# Patient Record
Sex: Male | Born: 1945
Health system: Southern US, Community
[De-identification: ages and names within clinical notes are randomized; demographics above are authoritative.]

## PROBLEM LIST (undated history)

## (undated) ENCOUNTER — Emergency Department (HOSPITAL_COMMUNITY): Discharge status: Against Medical Advice | Payer: 59

## (undated) DIAGNOSIS — M1990 Unspecified osteoarthritis, unspecified site: Secondary | ICD-10-CM

## (undated) DIAGNOSIS — T8859XA Other complications of anesthesia, initial encounter: Secondary | ICD-10-CM

## (undated) DIAGNOSIS — I10 Essential (primary) hypertension: Secondary | ICD-10-CM

## (undated) DIAGNOSIS — N320 Bladder-neck obstruction: Secondary | ICD-10-CM

## (undated) DIAGNOSIS — Z87442 Personal history of urinary calculi: Secondary | ICD-10-CM

## (undated) DIAGNOSIS — Z8719 Personal history of other diseases of the digestive system: Secondary | ICD-10-CM

## (undated) DIAGNOSIS — K219 Gastro-esophageal reflux disease without esophagitis: Secondary | ICD-10-CM

## (undated) DIAGNOSIS — T884XXA Failed or difficult intubation, initial encounter: Secondary | ICD-10-CM

## (undated) DIAGNOSIS — R32 Unspecified urinary incontinence: Secondary | ICD-10-CM

## (undated) DIAGNOSIS — Z973 Presence of spectacles and contact lenses: Secondary | ICD-10-CM

## (undated) DIAGNOSIS — Z978 Presence of other specified devices: Secondary | ICD-10-CM

## (undated) DIAGNOSIS — Z8711 Personal history of peptic ulcer disease: Secondary | ICD-10-CM

## (undated) DIAGNOSIS — E119 Type 2 diabetes mellitus without complications: Secondary | ICD-10-CM

## (undated) DIAGNOSIS — Z8546 Personal history of malignant neoplasm of prostate: Secondary | ICD-10-CM

## (undated) HISTORY — PX: UMBILICAL HERNIA REPAIR: SHX196

## (undated) HISTORY — PX: OTHER SURGICAL HISTORY: SHX169

## (undated) HISTORY — PX: TONSILLECTOMY: SUR1361

---

## 2002-12-21 HISTORY — PX: PROSTATECTOMY: SHX69

## 2010-03-03 ENCOUNTER — Ambulatory Visit (HOSPITAL_COMMUNITY): Admission: RE | Admit: 2010-03-03 | Discharge: 2010-03-03 | Payer: Self-pay | Admitting: Urology

## 2010-03-03 HISTORY — PX: OTHER SURGICAL HISTORY: SHX169

## 2010-04-28 HISTORY — PX: OTHER SURGICAL HISTORY: SHX169

## 2010-05-08 ENCOUNTER — Ambulatory Visit (HOSPITAL_BASED_OUTPATIENT_CLINIC_OR_DEPARTMENT_OTHER): Admission: RE | Admit: 2010-05-08 | Discharge: 2010-05-08 | Payer: Self-pay | Admitting: Urology

## 2010-08-08 ENCOUNTER — Ambulatory Visit (HOSPITAL_BASED_OUTPATIENT_CLINIC_OR_DEPARTMENT_OTHER): Admission: RE | Admit: 2010-08-08 | Discharge: 2010-08-08 | Payer: Self-pay | Admitting: Urology

## 2010-09-01 ENCOUNTER — Ambulatory Visit (HOSPITAL_BASED_OUTPATIENT_CLINIC_OR_DEPARTMENT_OTHER): Admission: RE | Admit: 2010-09-01 | Discharge: 2010-09-01 | Payer: Self-pay | Admitting: Urology

## 2010-09-01 HISTORY — PX: OTHER SURGICAL HISTORY: SHX169

## 2010-11-17 ENCOUNTER — Ambulatory Visit (HOSPITAL_COMMUNITY)
Admission: AD | Admit: 2010-11-17 | Discharge: 2010-11-17 | Payer: Self-pay | Source: Home / Self Care | Admitting: Urology

## 2011-03-03 LAB — BASIC METABOLIC PANEL
BUN: 10 mg/dL (ref 6–23)
CO2: 27 mEq/L (ref 19–32)
Chloride: 103 mEq/L (ref 96–112)
Creatinine, Ser: 0.84 mg/dL (ref 0.4–1.5)
Potassium: 4.5 mEq/L (ref 3.5–5.1)
Sodium: 140 mEq/L (ref 135–145)

## 2011-03-03 LAB — CBC: Hemoglobin: 13.8 g/dL (ref 13.0–17.0)

## 2011-03-03 LAB — SURGICAL PCR SCREEN: Staphylococcus aureus: POSITIVE — AB

## 2011-03-05 LAB — POCT I-STAT 4, (NA,K, GLUC, HGB,HCT)
Glucose, Bld: 131 mg/dL — ABNORMAL HIGH (ref 70–99)
HCT: 44 % (ref 39.0–52.0)
Sodium: 140 mEq/L (ref 135–145)

## 2011-03-06 LAB — POCT I-STAT 4, (NA,K, GLUC, HGB,HCT)
Glucose, Bld: 131 mg/dL — ABNORMAL HIGH (ref 70–99)
HCT: 44 % (ref 39.0–52.0)
Hemoglobin: 15 g/dL (ref 13.0–17.0)

## 2011-03-09 LAB — GLUCOSE, CAPILLARY: Glucose-Capillary: 114 mg/dL — ABNORMAL HIGH (ref 70–99)

## 2011-03-09 LAB — POCT I-STAT 4, (NA,K, GLUC, HGB,HCT): Potassium: 3.7 mEq/L (ref 3.5–5.1)

## 2011-03-16 LAB — URINALYSIS, ROUTINE W REFLEX MICROSCOPIC
Bilirubin Urine: NEGATIVE
Specific Gravity, Urine: 1.019 (ref 1.005–1.030)
Urobilinogen, UA: 0.2 mg/dL (ref 0.0–1.0)

## 2011-03-16 LAB — URINE MICROSCOPIC-ADD ON

## 2011-03-16 LAB — URINE CULTURE: Colony Count: 30000

## 2011-03-16 LAB — CBC
Hemoglobin: 12.8 g/dL — ABNORMAL LOW (ref 13.0–17.0)
MCHC: 32.5 g/dL (ref 30.0–36.0)
MCV: 88.7 fL (ref 78.0–100.0)
Platelets: 292 10*3/uL (ref 150–400)
RBC: 4.43 MIL/uL (ref 4.22–5.81)
RDW: 14.4 % (ref 11.5–15.5)
WBC: 15.5 10*3/uL — ABNORMAL HIGH (ref 4.0–10.5)

## 2011-03-16 LAB — BASIC METABOLIC PANEL
Calcium: 8.9 mg/dL (ref 8.4–10.5)
Potassium: 4.1 mEq/L (ref 3.5–5.1)

## 2011-07-23 ENCOUNTER — Ambulatory Visit (HOSPITAL_BASED_OUTPATIENT_CLINIC_OR_DEPARTMENT_OTHER)
Admission: RE | Admit: 2011-07-23 | Discharge: 2011-07-23 | Disposition: A | Discharge status: Home / Self Care | Payer: Medicare Other | Source: Ambulatory Visit | Attending: Urology | Admitting: Urology

## 2011-07-23 DIAGNOSIS — I10 Essential (primary) hypertension: Secondary | ICD-10-CM | POA: Insufficient documentation

## 2011-07-23 DIAGNOSIS — Z01812 Encounter for preprocedural laboratory examination: Secondary | ICD-10-CM | POA: Insufficient documentation

## 2011-07-23 DIAGNOSIS — N3946 Mixed incontinence: Secondary | ICD-10-CM | POA: Insufficient documentation

## 2011-07-23 DIAGNOSIS — N320 Bladder-neck obstruction: Secondary | ICD-10-CM | POA: Insufficient documentation

## 2011-07-23 LAB — POCT I-STAT 4, (NA,K, GLUC, HGB,HCT)
Potassium: 4.6 mEq/L (ref 3.5–5.1)
Sodium: 138 mEq/L (ref 135–145)

## 2011-07-28 NOTE — Op Note (Signed)
  NAME:  Marc Burns, Marc Burns              ACCOUNT NO.:  [id number]  MEDICAL RECORD NO.:  [id number]  LOCATION:                                 FACILITY:  PHYSICIAN:  Martina Sinner, MD DATE OF BIRTH:  November 26, 1946  DATE OF PROCEDURE:  07/23/2011 DATE OF DISCHARGE:                              OPERATIVE REPORT   PREOPERATIVE DIAGNOSIS:  Bladder neck contracture.  POSTOPERATIVE DIAGNOSIS:  Bladder neck contracture.  SURGERY:  Cystoscopy with balloon dilation of bladder neck contracture plus insertion of Foley catheter.  INDICATIONS:  Marc Burns was prepped and draped in the usual fashion for the above diagnosis.  He was given preoperative antibiotics.  A 17- [place] scope was utilized.  As of before, his penile and bulbar urethra normal.  Because the rigidity of the bladder neck especially distal to the bladder neck, I could not see the actual opening.  Under fluoroscopic and cystoscopic guidance, I easily passed a sensor wire curling up into the bladder.  I passed the balloon dilation catheter using the markers into the bladder.  I balloon dilated at 18 atmospheres of pressure for 5 minutes, deflated the balloon, and removed the balloon.  I used the 17-French scope to cystoscope along the sensor wire easily into the bladder.  He had grade 1 out of 4 bladder trabeculation.  There was no stitch, foreign body, or carcinoma.  He had no evidence of cystitis.  His bladder neck was nicely dilated.  I switched to a 22-French scope and with a little bit of difficulty, was able to negotiate through the bladder neck dilating a little bit further.  There was no bleeding.  There was no injury.  18-French catheter easily was inserted.  The patient was taken to recovery room.          ______________________________ Martina Sinner, MD     SAM/MEDQ  D:  07/23/2011  T:  07/23/2011  Job:  [id number]  Electronically Signed by Alfredo Martinez MD on 07/28/2011 08:00:17 AM

## 2012-02-15 DIAGNOSIS — N320 Bladder-neck obstruction: Secondary | ICD-10-CM | POA: Diagnosis not present

## 2012-02-15 DIAGNOSIS — N3941 Urge incontinence: Secondary | ICD-10-CM | POA: Diagnosis not present

## 2012-07-13 DIAGNOSIS — E7800 Pure hypercholesterolemia, unspecified: Secondary | ICD-10-CM | POA: Diagnosis not present

## 2012-07-13 DIAGNOSIS — I10 Essential (primary) hypertension: Secondary | ICD-10-CM | POA: Diagnosis not present

## 2012-07-13 DIAGNOSIS — N400 Enlarged prostate without lower urinary tract symptoms: Secondary | ICD-10-CM | POA: Diagnosis not present

## 2012-10-20 DIAGNOSIS — I10 Essential (primary) hypertension: Secondary | ICD-10-CM | POA: Diagnosis not present

## 2012-10-20 DIAGNOSIS — K219 Gastro-esophageal reflux disease without esophagitis: Secondary | ICD-10-CM | POA: Diagnosis not present

## 2012-10-20 DIAGNOSIS — E7800 Pure hypercholesterolemia, unspecified: Secondary | ICD-10-CM | POA: Diagnosis not present

## 2012-11-14 DIAGNOSIS — N3941 Urge incontinence: Secondary | ICD-10-CM | POA: Diagnosis not present

## 2012-11-14 DIAGNOSIS — N3020 Other chronic cystitis without hematuria: Secondary | ICD-10-CM | POA: Diagnosis not present

## 2012-11-14 DIAGNOSIS — N320 Bladder-neck obstruction: Secondary | ICD-10-CM | POA: Diagnosis not present

## 2013-07-26 DIAGNOSIS — N200 Calculus of kidney: Secondary | ICD-10-CM | POA: Diagnosis not present

## 2013-07-26 DIAGNOSIS — N35919 Unspecified urethral stricture, male, unspecified site: Secondary | ICD-10-CM | POA: Diagnosis present

## 2013-07-26 DIAGNOSIS — Z9889 Other specified postprocedural states: Secondary | ICD-10-CM | POA: Diagnosis not present

## 2013-07-26 DIAGNOSIS — R6510 Systemic inflammatory response syndrome (SIRS) of non-infectious origin without acute organ dysfunction: Secondary | ICD-10-CM | POA: Diagnosis not present

## 2013-07-26 DIAGNOSIS — E669 Obesity, unspecified: Secondary | ICD-10-CM | POA: Diagnosis present

## 2013-07-26 DIAGNOSIS — K5730 Diverticulosis of large intestine without perforation or abscess without bleeding: Secondary | ICD-10-CM | POA: Diagnosis not present

## 2013-07-26 DIAGNOSIS — Z8673 Personal history of transient ischemic attack (TIA), and cerebral infarction without residual deficits: Secondary | ICD-10-CM | POA: Diagnosis not present

## 2013-07-26 DIAGNOSIS — N320 Bladder-neck obstruction: Secondary | ICD-10-CM | POA: Diagnosis not present

## 2013-07-26 DIAGNOSIS — A419 Sepsis, unspecified organism: Secondary | ICD-10-CM | POA: Diagnosis present

## 2013-07-26 DIAGNOSIS — G809 Cerebral palsy, unspecified: Secondary | ICD-10-CM | POA: Diagnosis present

## 2013-07-26 DIAGNOSIS — Z6841 Body Mass Index (BMI) 40.0 and over, adult: Secondary | ICD-10-CM | POA: Diagnosis not present

## 2013-07-26 DIAGNOSIS — N1330 Unspecified hydronephrosis: Secondary | ICD-10-CM | POA: Diagnosis not present

## 2013-07-26 DIAGNOSIS — T83091A Other mechanical complication of indwelling urethral catheter, initial encounter: Secondary | ICD-10-CM | POA: Diagnosis not present

## 2013-07-26 DIAGNOSIS — Z882 Allergy status to sulfonamides status: Secondary | ICD-10-CM | POA: Diagnosis not present

## 2013-07-26 DIAGNOSIS — T83511A Infection and inflammatory reaction due to indwelling urethral catheter, initial encounter: Secondary | ICD-10-CM | POA: Diagnosis not present

## 2013-07-26 DIAGNOSIS — E785 Hyperlipidemia, unspecified: Secondary | ICD-10-CM | POA: Diagnosis present

## 2013-07-26 DIAGNOSIS — K219 Gastro-esophageal reflux disease without esophagitis: Secondary | ICD-10-CM | POA: Diagnosis present

## 2013-07-26 DIAGNOSIS — Z8546 Personal history of malignant neoplasm of prostate: Secondary | ICD-10-CM | POA: Diagnosis not present

## 2013-07-26 DIAGNOSIS — Z79899 Other long term (current) drug therapy: Secondary | ICD-10-CM | POA: Diagnosis not present

## 2013-07-26 DIAGNOSIS — N390 Urinary tract infection, site not specified: Secondary | ICD-10-CM | POA: Diagnosis not present

## 2013-07-26 DIAGNOSIS — I10 Essential (primary) hypertension: Secondary | ICD-10-CM | POA: Diagnosis present

## 2013-07-26 DIAGNOSIS — Z993 Dependence on wheelchair: Secondary | ICD-10-CM | POA: Diagnosis not present

## 2013-08-11 DIAGNOSIS — N3942 Incontinence without sensory awareness: Secondary | ICD-10-CM | POA: Diagnosis not present

## 2013-08-11 DIAGNOSIS — N3020 Other chronic cystitis without hematuria: Secondary | ICD-10-CM | POA: Diagnosis not present

## 2013-08-28 DIAGNOSIS — N12 Tubulo-interstitial nephritis, not specified as acute or chronic: Secondary | ICD-10-CM | POA: Diagnosis not present

## 2013-09-04 ENCOUNTER — Other Ambulatory Visit: Payer: Self-pay | Admitting: Urology

## 2013-09-05 ENCOUNTER — Encounter (HOSPITAL_BASED_OUTPATIENT_CLINIC_OR_DEPARTMENT_OTHER): Payer: Self-pay | Admitting: *Deleted

## 2013-09-05 NOTE — Progress Notes (Signed)
NPO AFTER MN. ARRIVE AT 0830. NEEDS ISTAT AND EKG. WILL TAKE METOPROLOL AND PRILOSEC AM OF SURG W/ SIP OF WATER.

## 2013-09-05 NOTE — Progress Notes (Addendum)
09/05/13 1527  OBSTRUCTIVE SLEEP APNEA  Have you ever been diagnosed with sleep apnea through a sleep study? No  Do you snore loudly (loud enough to be heard through closed doors)?  0  Do you often feel tired, fatigued, or sleepy during the daytime? 0  Has anyone observed you stop breathing during your sleep? 0  Do you have, or are you being treated for high blood pressure? 1  BMI more than 35 kg/m2? 1  Age over 69 years old? 1  Neck circumference greater than 40 cm/18 inches? 1  Gender: 1  Obstructive Sleep Apnea Score 5  Score 4 or greater  Results sent to PCP  LETTER FAXED TO PT PCP, DR Selinda Flavin , [facility] IN [place]

## 2013-09-11 NOTE — H&P (Signed)
History of Present Illness   Marc Burns has a complicated bladder neck issue. He has not wanted diversion in the past. In August 2012, I did a balloon dilation of his bladder neck contracture.  He normally manages himself with an 50 French catheter he changes every 2-3 weeks to try to reduce infections. He often uses a Coude catheter if there is a little bit of scar tissue. Recently he went to the hospital in [place] and was admitted from Wednesday to Saturday with a urinary tract infection. He was told he had a right renal stone but it was not obstructing. Things have settled down. He was quite ill with a lot of discomfort at the time.   He now has a 70 [place] Coude in, since he could not get the 18 straight in spite of using the Coude prior. He still capped it off. Other times he uses a leg bag when he is out in public. He finds that the 16 does not drain as well.  He wondered if a suprapubic tube would help.  Review of Systems: No other change in bowel or neurologic systems.   There is no other modifying factors or associated signs or symptoms. There is no other aggravating or relieving factors. The symptoms were moderate in severity and came on fairly acutely.    Past Medical History Problems  1. History of  Heartburn 787.1 2. History of  Nephrolithiasis V13.01 3. History of  Prostate Cancer V10.46  Surgical History Problems  1. History of  Cystoscopy For Urethral Stricture 2. History of  Cystoscopy For Urethral Stricture 3. History of  Cystoscopy For Urethral Stricture 4. History of  Cystoscopy With Biopsy 5. History of  Cystoscopy With Fulguration 6. History of  Prostate Surgery 7. Prostate Surgery 8. History of  Transurethral Resection Of Bladder Neck 9. History of  Umbilical Hernia Repair  Current Meds 1. Centrum Silver TABS; Therapy: (Recorded:08Mar2011) to 2. Cephalexin 500 MG Oral Capsule; Therapy: (Recorded:22Aug2014) to 3. Crestor 10 MG Oral Tablet; Therapy:  (Recorded:08Mar2011) to 4. Metoprolol Tartrate 25 MG Oral Tablet; Therapy: (Recorded:08Mar2011) to 5. PriLOSEC OTC 20 MG Oral Tablet Delayed Release; Therapy: (Recorded:08Mar2011) to  Allergies Medication  1. Sulfa Drugs  Family History Problems  1. Paternal history of  Death In The Family Father age 70; heart attack 2. Maternal history of  Death In The Family Mother age 55; heart attack 3. Family history of  Family Health Status Number Of Children 3 daughters  Social History Problems  1. Alcohol Use 2 a day 2. Caffeine Use 3 a day 3. Marital History - Currently Married 4. Never A Smoker 5. Occupation: retired Denied  6. History of  Tobacco Use  Vitals Vital Signs [Data Includes: Last 1 Day]  22Aug2014 10:45AM  BMI Calculated: 23.7 BSA Calculated: 1.88 Height: 5 ft 9 in Weight: 160 lb  Blood Pressure: 138 / 73 Temperature: 98.2 F Heart Rate: 73  Assessment Assessed  1. Chronic Cystitis 595.2 2. Urinary Incontinence Without Sensory Awareness Of Fullness 788.34  Discussion/Summary   In my opinion, Marc Burns should not try to let his bladder neck scar down, hoping it will be 100%. If it gets down to an irregular and small abnormality, he will still be totally incontinent and would be very difficult to manage even with the suprapubic. It will not change the natural history of UTI's, the risk of bladder stones or upper tract issues.   He does not want another dilation yet. If he says  he has a trouble with the 16 versus the 18, he is going to call me and we will balloon dilate him under anesthesia. He still does not want a diversion. Upper tracts have been cleared except or renal stone. See in a year.  After a thorough review of the management options for the patient's condition the patient  elected to proceed with surgical therapy as noted above. We have discussed the potential benefits and risks of the procedure, side effects of the proposed treatment, the  likelihood of the patient achieving the goals of the procedure, and any potential problems that might occur during the procedure or recuperation. Informed consent has been obtained.

## 2013-09-12 ENCOUNTER — Ambulatory Visit (HOSPITAL_COMMUNITY): Payer: Medicare Other

## 2013-09-12 ENCOUNTER — Other Ambulatory Visit: Payer: Self-pay

## 2013-09-12 ENCOUNTER — Ambulatory Visit (HOSPITAL_BASED_OUTPATIENT_CLINIC_OR_DEPARTMENT_OTHER)
Admission: RE | Admit: 2013-09-12 | Discharge: 2013-09-12 | Disposition: A | Discharge status: Home / Self Care | Payer: Medicare Other | Source: Ambulatory Visit | Attending: Urology | Admitting: Urology

## 2013-09-12 ENCOUNTER — Ambulatory Visit (HOSPITAL_BASED_OUTPATIENT_CLINIC_OR_DEPARTMENT_OTHER): Payer: Medicare Other | Admitting: Anesthesiology

## 2013-09-12 ENCOUNTER — Encounter (HOSPITAL_BASED_OUTPATIENT_CLINIC_OR_DEPARTMENT_OTHER): Payer: Self-pay | Admitting: Anesthesiology

## 2013-09-12 ENCOUNTER — Encounter (HOSPITAL_BASED_OUTPATIENT_CLINIC_OR_DEPARTMENT_OTHER): Payer: Self-pay

## 2013-09-12 ENCOUNTER — Encounter (HOSPITAL_BASED_OUTPATIENT_CLINIC_OR_DEPARTMENT_OTHER)
Admission: RE | Disposition: A | Discharge status: Home / Self Care | Payer: Self-pay | Source: Ambulatory Visit | Attending: Urology

## 2013-09-12 DIAGNOSIS — N3020 Other chronic cystitis without hematuria: Secondary | ICD-10-CM | POA: Diagnosis not present

## 2013-09-12 DIAGNOSIS — N320 Bladder-neck obstruction: Secondary | ICD-10-CM | POA: Diagnosis not present

## 2013-09-12 DIAGNOSIS — Z79899 Other long term (current) drug therapy: Secondary | ICD-10-CM | POA: Diagnosis not present

## 2013-09-12 DIAGNOSIS — N3942 Incontinence without sensory awareness: Secondary | ICD-10-CM | POA: Diagnosis not present

## 2013-09-12 DIAGNOSIS — Z8546 Personal history of malignant neoplasm of prostate: Secondary | ICD-10-CM | POA: Diagnosis not present

## 2013-09-12 DIAGNOSIS — K219 Gastro-esophageal reflux disease without esophagitis: Secondary | ICD-10-CM | POA: Diagnosis not present

## 2013-09-12 DIAGNOSIS — I10 Essential (primary) hypertension: Secondary | ICD-10-CM | POA: Diagnosis not present

## 2013-09-12 HISTORY — DX: Personal history of other diseases of the digestive system: Z87.19

## 2013-09-12 HISTORY — PX: BALLOON DILATION: SHX5330

## 2013-09-12 HISTORY — DX: Unspecified osteoarthritis, unspecified site: M19.90

## 2013-09-12 HISTORY — DX: Personal history of peptic ulcer disease: Z87.11

## 2013-09-12 HISTORY — DX: Essential (primary) hypertension: I10

## 2013-09-12 HISTORY — DX: Personal history of malignant neoplasm of prostate: Z85.46

## 2013-09-12 HISTORY — DX: Presence of spectacles and contact lenses: Z97.3

## 2013-09-12 HISTORY — DX: Gastro-esophageal reflux disease without esophagitis: K21.9

## 2013-09-12 HISTORY — DX: Personal history of urinary calculi: Z87.442

## 2013-09-12 HISTORY — DX: Bladder-neck obstruction: N32.0

## 2013-09-12 HISTORY — DX: Unspecified urinary incontinence: R32

## 2013-09-12 LAB — POCT I-STAT 4, (NA,K, GLUC, HGB,HCT): Potassium: 4.1 mEq/L (ref 3.5–5.1)

## 2013-09-12 SURGERY — BALLOON DILATION
Anesthesia: General | Site: Urethra | Wound class: Clean Contaminated

## 2013-09-12 MED ORDER — OXYCODONE HCL 5 MG/5ML PO SOLN
5.0000 mg | Freq: Once | ORAL | Status: DC | PRN
  Filled 2013-09-12: qty 5

## 2013-09-12 MED ORDER — IOHEXOL 350 MG/ML SOLN
INTRAVENOUS | Status: DC | PRN
  Administered 2013-09-12: 10 mL

## 2013-09-12 MED ORDER — PROPOFOL 10 MG/ML IV BOLUS
INTRAVENOUS | Status: DC | PRN
  Administered 2013-09-12: 250 mg via INTRAVENOUS

## 2013-09-12 MED ORDER — LIDOCAINE HCL (CARDIAC) 20 MG/ML IV SOLN
INTRAVENOUS | Status: DC | PRN
  Administered 2013-09-12: 80 mg via INTRAVENOUS

## 2013-09-12 MED ORDER — CEFAZOLIN SODIUM-DEXTROSE 2-3 GM-% IV SOLR
2.0000 g | INTRAVENOUS | Status: AC
  Administered 2013-09-12: 2 g via INTRAVENOUS
  Filled 2013-09-12: qty 50

## 2013-09-12 MED ORDER — CEFAZOLIN SODIUM 1-5 GM-% IV SOLN
1.0000 g | INTRAVENOUS | Status: DC
  Filled 2013-09-12: qty 50

## 2013-09-12 MED ORDER — FENTANYL CITRATE 0.05 MG/ML IJ SOLN
INTRAMUSCULAR | Status: DC | PRN
  Administered 2013-09-12 (×2): 50 ug via INTRAVENOUS

## 2013-09-12 MED ORDER — LACTATED RINGERS IV SOLN
INTRAVENOUS | Status: DC
  Administered 2013-09-12: 09:00:00 via INTRAVENOUS
  Filled 2013-09-12: qty 1000

## 2013-09-12 MED ORDER — OXYCODONE HCL 5 MG PO TABS
5.0000 mg | ORAL_TABLET | Freq: Once | ORAL | Status: DC | PRN
  Filled 2013-09-12: qty 1

## 2013-09-12 MED ORDER — STERILE WATER FOR IRRIGATION IR SOLN
Status: DC | PRN
  Administered 2013-09-12: 3000 mL

## 2013-09-12 MED ORDER — HYDROMORPHONE HCL PF 1 MG/ML IJ SOLN
0.2500 mg | INTRAMUSCULAR | Status: DC | PRN
  Filled 2013-09-12: qty 1

## 2013-09-12 MED ORDER — MEPERIDINE HCL 25 MG/ML IJ SOLN
6.2500 mg | INTRAMUSCULAR | Status: DC | PRN
  Filled 2013-09-12: qty 1

## 2013-09-12 MED ORDER — ONDANSETRON HCL 4 MG/2ML IJ SOLN
INTRAMUSCULAR | Status: DC | PRN
  Administered 2013-09-12: 4 mg via INTRAVENOUS

## 2013-09-12 MED ORDER — HYDROCODONE-ACETAMINOPHEN 5-325 MG PO TABS: 1.0000 | ORAL_TABLET | Freq: Four times a day (QID) | ORAL | Status: DC | PRN

## 2013-09-12 MED ORDER — CIPROFLOXACIN HCL 250 MG PO TABS: 250.0000 mg | ORAL_TABLET | Freq: Two times a day (BID) | ORAL | Status: DC

## 2013-09-12 MED ORDER — DEXAMETHASONE SODIUM PHOSPHATE 4 MG/ML IJ SOLN
INTRAMUSCULAR | Status: DC | PRN
  Administered 2013-09-12: 10 mg via INTRAVENOUS

## 2013-09-12 MED ORDER — PROMETHAZINE HCL 25 MG/ML IJ SOLN
6.2500 mg | INTRAMUSCULAR | Status: DC | PRN
  Filled 2013-09-12: qty 1

## 2013-09-12 MED ORDER — CIPROFLOXACIN IN D5W 400 MG/200ML IV SOLN
400.0000 mg | INTRAVENOUS | Status: AC
  Administered 2013-09-12: 400 mg via INTRAVENOUS
  Filled 2013-09-12: qty 200

## 2013-09-12 SURGICAL SUPPLY — 24 items
ADAPTER CATH URET PLST 4-6FR (CATHETERS) IMPLANT
BAG DRAIN URO-CYSTO SKYTR STRL (DRAIN) ×2 IMPLANT
BAG URINE LEG 19OZ MD ST LTX (BAG) ×2 IMPLANT
BALLN NEPHROSTOMY (BALLOONS) ×2
BALLOON NEPHROSTOMY (BALLOONS) ×1 IMPLANT
CANISTER SUCT LVC 12 LTR MEDI- (MISCELLANEOUS) ×2 IMPLANT
CATH FOLEY 2W COUNCIL 20FR 5CC (CATHETERS) ×2 IMPLANT
CATH ROBINSON RED A/P 14FR (CATHETERS) IMPLANT
CATH URET 5FR 28IN CONE TIP (BALLOONS)
CATH URET 5FR 28IN OPEN ENDED (CATHETERS) ×2 IMPLANT
CATH URET 5FR 70CM CONE TIP (BALLOONS) IMPLANT
CLOTH BEACON ORANGE TIMEOUT ST (SAFETY) ×2 IMPLANT
DRAPE CAMERA CLOSED 9X96 (DRAPES) ×2 IMPLANT
GLOVE BIO SURGEON STRL SZ7.5 (GLOVE) ×2 IMPLANT
GLOVE BIOGEL PI IND STRL 7.5 (GLOVE) ×2 IMPLANT
GLOVE BIOGEL PI INDICATOR 7.5 (GLOVE) ×2
GOWN PREVENTION PLUS LG XLONG (DISPOSABLE) ×2 IMPLANT
GOWN STRL REIN XL XLG (GOWN DISPOSABLE) IMPLANT
GUIDEWIRE STR DUAL SENSOR (WIRE) ×2 IMPLANT
NS IRRIG 500ML POUR BTL (IV SOLUTION) IMPLANT
PACK CYSTOSCOPY (CUSTOM PROCEDURE TRAY) ×2 IMPLANT
SYRINGE IRR TOOMEY STRL 70CC (SYRINGE) IMPLANT
WATER STERILE IRR 3000ML UROMA (IV SOLUTION) ×2 IMPLANT
WATER STERILE IRR 500ML POUR (IV SOLUTION) ×2 IMPLANT

## 2013-09-12 NOTE — Transfer of Care (Signed)
Immediate Anesthesia Transfer of Care Note  Patient: Marc Burns  Procedure(s) Performed: Procedure(s): CYSTO BALLOON DILATION/RETROGRADE URETHROGRAM (N/A)  Patient Location: PACU  Anesthesia Type:General  Level of Consciousness: awake, alert  and oriented  Airway & Oxygen Therapy: Patient Spontanous Breathing and Patient connected to nasal cannula oxygen  Post-op Assessment: Report given to PACU RN  Post vital signs: Reviewed and stable  Complications: No apparent anesthesia complications

## 2013-09-12 NOTE — Op Note (Signed)
Preoperative diagnosis: Bladder neck contracture Postoperative diagnosis: Bladder neck contracture Surgery: Cystoscopy, retrograde urethrogram, balloon dilation of bladder neck contracture Surgeon: Dr. Lorin Picket Tranika Scholler  The patient has the above diagnoses and consented the above procedure. Preoperative antibiotics were given.  Leg position was excellent  17 [place] scope was utilized. Penile and bulbar urethra were within normal limits. The proximal bulbar urethra and near the membranous urethra and bladder neck he had a marked false passage at 6:00 with a large ski jump affect into a bladder neck but estimated at approximately 16 [place]. I did not try to cystoscope through it.  I did a retrograde urethrogram with the patient in the AP position. I utilized a 6 [place] open-ended ureteral catheter cut as per protocol. 10 cc of contrast was injected under fluoroscopy and x-ray was taken. The direct along the normal-looking urethra into the bladder easily. There was no extravasation  Sensor wire was passed using a 21 [place] scope into the bladder neck and was in good position fluoroscopically. Balloon dilation catheter was placed over it into the bladder fluoroscopically. It was dilated to 18 atmospheres of pressure. The bladder neck was dilated for 5 minutes. The balloon dilation catheter was emptied and removed.  With the 21 [place] scope I could then passed along the urethra and alongside the wire into the bladder neck. Bladder neck I was a little bit tight around the scope would easily went in. The bladder mucosa and trigone were normal. There is no cystitis. Is no carcinoma.  Surprisingly a 20 [place] catheter would not go into the bladder and was being hung up more distally in the bulbar urethra. There was a little bit of a false passage in that area. An 89 [place] Coude went in to the bladder easily and it was not passed over the wire but alongside it. Wire was removed. Straight drainage to  leg bag applied. Patient was taken to recovery

## 2013-09-12 NOTE — Interval H&P Note (Signed)
History and Physical Interval Note:  09/12/2013 10:26 AM  Marc Burns  has presented today for surgery, with the diagnosis of BLADDER NECK CONTRACTURE  The various methods of treatment have been discussed with the patient and family. After consideration of risks, benefits and other options for treatment, the patient has consented to  Procedure(s): CYSTO BALLOON DILATION/RETROGRADE URETHROGRAM (N/A) as a surgical intervention .  The patient's history has been reviewed, patient examined, no change in status, stable for surgery.  I have reviewed the patient's chart and labs.  Questions were answered to the patient's satisfaction.     Katrin Grabel A

## 2013-09-12 NOTE — Anesthesia Procedure Notes (Signed)
Procedure Name: LMA Insertion Date/Time: 09/12/2013 10:02 AM Performed by: Maris Berger T Pre-anesthesia Checklist: Patient identified, Emergency Drugs available, Suction available and Patient being monitored Patient Re-evaluated:Patient Re-evaluated prior to inductionOxygen Delivery Method: Circle System Utilized Preoxygenation: Pre-oxygenation with 100% oxygen Intubation Type: IV induction Ventilation: Mask ventilation without difficulty LMA: LMA inserted LMA Size: 5.0 Number of attempts: 1 Placement Confirmation: positive ETCO2 Dental Injury: Teeth and Oropharynx as per pre-operative assessment  Comments: Gauze roll between teeth

## 2013-09-12 NOTE — Anesthesia Postprocedure Evaluation (Signed)
Anesthesia Post Note  Patient: Marc Burns  Procedure(s) Performed: Procedure(s) (LRB): CYSTO BALLOON DILATION/RETROGRADE URETHROGRAM (N/A)  Anesthesia type: General  Patient location: PACU  Post pain: Pain level controlled  Post assessment: Post-op Vital signs reviewed  Last Vitals: BP 135/73  Pulse 72  Temp(Src) 36.6 C (Oral)  Resp 16  Ht 5\' 9"  (1.753 m)  Wt 263 lb (119.296 kg)  BMI 38.82 kg/m2  SpO2 99%  Post vital signs: Reviewed  Level of consciousness: sedated  Complications: No apparent anesthesia complications

## 2013-09-12 NOTE — Anesthesia Preprocedure Evaluation (Addendum)
Anesthesia Evaluation  Patient identified by MRN, date of birth, ID band Patient awake    Reviewed: Allergy & Precautions, H&P , NPO status , Patient's Chart, lab work & pertinent test results, reviewed documented beta blocker date and time   Airway Mallampati: II TM Distance: >3 FB Neck ROM: Full    Dental  (+) Dental Advisory Given   Pulmonary neg pulmonary ROS,  breath sounds clear to auscultation        Cardiovascular hypertension, Pt. on medications and Pt. on home beta blockers negative cardio ROS  Rhythm:Regular Rate:Normal     Neuro/Psych negative neurological ROS  negative psych ROS   GI/Hepatic Neg liver ROS, GERD-  Medicated,  Endo/Other  negative endocrine ROS  Renal/GU negative Renal ROS     Musculoskeletal negative musculoskeletal ROS (+)   Abdominal (+) + obese,   Peds  Hematology negative hematology ROS (+)   Anesthesia Other Findings   Reproductive/Obstetrics                          Anesthesia Physical Anesthesia Plan  ASA: II  Anesthesia Plan: General   Post-op Pain Management:    Induction: Intravenous  Airway Management Planned: LMA  Additional Equipment:   Intra-op Plan:   Post-operative Plan: Extubation in OR  Informed Consent: I have reviewed the patients History and Physical, chart, labs and discussed the procedure including the risks, benefits and alternatives for the proposed anesthesia with the patient or authorized representative who has indicated his/her understanding and acceptance.   Dental advisory given  Plan Discussed with: CRNA  Anesthesia Plan Comments:         Anesthesia Quick Evaluation

## 2013-09-13 ENCOUNTER — Encounter (HOSPITAL_BASED_OUTPATIENT_CLINIC_OR_DEPARTMENT_OTHER): Payer: Self-pay | Admitting: Urology

## 2013-09-21 DIAGNOSIS — N320 Bladder-neck obstruction: Secondary | ICD-10-CM | POA: Diagnosis not present

## 2013-10-19 DIAGNOSIS — K219 Gastro-esophageal reflux disease without esophagitis: Secondary | ICD-10-CM | POA: Diagnosis not present

## 2013-10-19 DIAGNOSIS — E109 Type 1 diabetes mellitus without complications: Secondary | ICD-10-CM | POA: Diagnosis not present

## 2013-10-19 DIAGNOSIS — E7800 Pure hypercholesterolemia, unspecified: Secondary | ICD-10-CM | POA: Diagnosis not present

## 2013-10-19 DIAGNOSIS — E669 Obesity, unspecified: Secondary | ICD-10-CM | POA: Diagnosis not present

## 2013-10-19 DIAGNOSIS — I10 Essential (primary) hypertension: Secondary | ICD-10-CM | POA: Diagnosis not present

## 2013-10-23 DIAGNOSIS — I10 Essential (primary) hypertension: Secondary | ICD-10-CM | POA: Diagnosis not present

## 2013-10-23 DIAGNOSIS — Z23 Encounter for immunization: Secondary | ICD-10-CM | POA: Diagnosis not present

## 2013-10-23 DIAGNOSIS — Z0000 Encounter for general adult medical examination without abnormal findings: Secondary | ICD-10-CM | POA: Diagnosis not present

## 2013-10-23 DIAGNOSIS — K219 Gastro-esophageal reflux disease without esophagitis: Secondary | ICD-10-CM | POA: Diagnosis not present

## 2013-10-23 DIAGNOSIS — E7800 Pure hypercholesterolemia, unspecified: Secondary | ICD-10-CM | POA: Diagnosis not present

## 2014-02-16 DIAGNOSIS — J209 Acute bronchitis, unspecified: Secondary | ICD-10-CM | POA: Diagnosis not present

## 2014-02-26 DIAGNOSIS — M545 Low back pain, unspecified: Secondary | ICD-10-CM | POA: Diagnosis not present

## 2014-02-26 DIAGNOSIS — Z882 Allergy status to sulfonamides status: Secondary | ICD-10-CM | POA: Diagnosis not present

## 2014-02-26 DIAGNOSIS — K219 Gastro-esophageal reflux disease without esophagitis: Secondary | ICD-10-CM | POA: Diagnosis not present

## 2014-02-26 DIAGNOSIS — I10 Essential (primary) hypertension: Secondary | ICD-10-CM | POA: Diagnosis not present

## 2014-02-26 DIAGNOSIS — Z8546 Personal history of malignant neoplasm of prostate: Secondary | ICD-10-CM | POA: Diagnosis not present

## 2014-02-26 DIAGNOSIS — G809 Cerebral palsy, unspecified: Secondary | ICD-10-CM | POA: Diagnosis not present

## 2014-02-26 DIAGNOSIS — E119 Type 2 diabetes mellitus without complications: Secondary | ICD-10-CM | POA: Diagnosis not present

## 2014-02-26 DIAGNOSIS — Z79899 Other long term (current) drug therapy: Secondary | ICD-10-CM | POA: Diagnosis not present

## 2014-02-26 DIAGNOSIS — E785 Hyperlipidemia, unspecified: Secondary | ICD-10-CM | POA: Diagnosis not present

## 2014-02-26 DIAGNOSIS — Z8249 Family history of ischemic heart disease and other diseases of the circulatory system: Secondary | ICD-10-CM | POA: Diagnosis not present

## 2014-02-26 DIAGNOSIS — T83511A Infection and inflammatory reaction due to indwelling urethral catheter, initial encounter: Secondary | ICD-10-CM | POA: Diagnosis not present

## 2014-02-26 DIAGNOSIS — R748 Abnormal levels of other serum enzymes: Secondary | ICD-10-CM | POA: Diagnosis not present

## 2014-02-26 DIAGNOSIS — R0789 Other chest pain: Secondary | ICD-10-CM | POA: Diagnosis not present

## 2014-02-26 DIAGNOSIS — N390 Urinary tract infection, site not specified: Secondary | ICD-10-CM | POA: Diagnosis not present

## 2014-02-27 DIAGNOSIS — N390 Urinary tract infection, site not specified: Secondary | ICD-10-CM | POA: Diagnosis not present

## 2014-02-27 DIAGNOSIS — R079 Chest pain, unspecified: Secondary | ICD-10-CM | POA: Diagnosis not present

## 2014-02-28 DIAGNOSIS — R079 Chest pain, unspecified: Secondary | ICD-10-CM | POA: Diagnosis not present

## 2014-03-02 DIAGNOSIS — R079 Chest pain, unspecified: Secondary | ICD-10-CM | POA: Diagnosis not present

## 2014-03-07 DIAGNOSIS — E109 Type 1 diabetes mellitus without complications: Secondary | ICD-10-CM | POA: Diagnosis not present

## 2014-03-07 DIAGNOSIS — R0789 Other chest pain: Secondary | ICD-10-CM | POA: Diagnosis not present

## 2014-03-07 DIAGNOSIS — N3090 Cystitis, unspecified without hematuria: Secondary | ICD-10-CM | POA: Diagnosis not present

## 2014-03-22 DIAGNOSIS — Z6839 Body mass index (BMI) 39.0-39.9, adult: Secondary | ICD-10-CM | POA: Diagnosis not present

## 2014-03-22 DIAGNOSIS — K219 Gastro-esophageal reflux disease without esophagitis: Secondary | ICD-10-CM | POA: Diagnosis not present

## 2014-03-22 DIAGNOSIS — Z882 Allergy status to sulfonamides status: Secondary | ICD-10-CM | POA: Diagnosis not present

## 2014-03-22 DIAGNOSIS — I10 Essential (primary) hypertension: Secondary | ICD-10-CM | POA: Diagnosis not present

## 2014-03-22 DIAGNOSIS — Z79899 Other long term (current) drug therapy: Secondary | ICD-10-CM | POA: Diagnosis not present

## 2014-03-22 DIAGNOSIS — R82998 Other abnormal findings in urine: Secondary | ICD-10-CM | POA: Diagnosis not present

## 2014-03-22 DIAGNOSIS — Z87440 Personal history of urinary (tract) infections: Secondary | ICD-10-CM | POA: Diagnosis not present

## 2014-03-22 DIAGNOSIS — Z87442 Personal history of urinary calculi: Secondary | ICD-10-CM | POA: Diagnosis not present

## 2014-03-22 DIAGNOSIS — E119 Type 2 diabetes mellitus without complications: Secondary | ICD-10-CM | POA: Diagnosis not present

## 2014-03-22 DIAGNOSIS — N390 Urinary tract infection, site not specified: Secondary | ICD-10-CM | POA: Diagnosis not present

## 2014-03-22 DIAGNOSIS — Z8546 Personal history of malignant neoplasm of prostate: Secondary | ICD-10-CM | POA: Diagnosis not present

## 2014-03-22 DIAGNOSIS — Z8249 Family history of ischemic heart disease and other diseases of the circulatory system: Secondary | ICD-10-CM | POA: Diagnosis not present

## 2014-03-22 DIAGNOSIS — K8000 Calculus of gallbladder with acute cholecystitis without obstruction: Secondary | ICD-10-CM | POA: Diagnosis not present

## 2014-03-22 DIAGNOSIS — K8020 Calculus of gallbladder without cholecystitis without obstruction: Secondary | ICD-10-CM | POA: Diagnosis not present

## 2014-03-22 DIAGNOSIS — K8010 Calculus of gallbladder with chronic cholecystitis without obstruction: Secondary | ICD-10-CM | POA: Diagnosis not present

## 2014-03-22 DIAGNOSIS — Z8489 Family history of other specified conditions: Secondary | ICD-10-CM | POA: Diagnosis not present

## 2014-03-22 DIAGNOSIS — G809 Cerebral palsy, unspecified: Secondary | ICD-10-CM | POA: Diagnosis not present

## 2014-03-22 DIAGNOSIS — K7689 Other specified diseases of liver: Secondary | ICD-10-CM | POA: Diagnosis not present

## 2014-03-22 DIAGNOSIS — Z7982 Long term (current) use of aspirin: Secondary | ICD-10-CM | POA: Diagnosis not present

## 2014-03-23 DIAGNOSIS — R748 Abnormal levels of other serum enzymes: Secondary | ICD-10-CM | POA: Diagnosis not present

## 2014-03-23 DIAGNOSIS — R109 Unspecified abdominal pain: Secondary | ICD-10-CM | POA: Diagnosis not present

## 2014-03-23 DIAGNOSIS — K812 Acute cholecystitis with chronic cholecystitis: Secondary | ICD-10-CM | POA: Diagnosis not present

## 2014-03-23 DIAGNOSIS — K8020 Calculus of gallbladder without cholecystitis without obstruction: Secondary | ICD-10-CM | POA: Diagnosis not present

## 2014-03-23 DIAGNOSIS — N390 Urinary tract infection, site not specified: Secondary | ICD-10-CM | POA: Diagnosis not present

## 2014-03-23 DIAGNOSIS — R1084 Generalized abdominal pain: Secondary | ICD-10-CM | POA: Diagnosis not present

## 2014-03-24 DIAGNOSIS — R109 Unspecified abdominal pain: Secondary | ICD-10-CM | POA: Diagnosis not present

## 2014-03-24 DIAGNOSIS — K8000 Calculus of gallbladder with acute cholecystitis without obstruction: Secondary | ICD-10-CM | POA: Diagnosis not present

## 2014-03-24 DIAGNOSIS — K8010 Calculus of gallbladder with chronic cholecystitis without obstruction: Secondary | ICD-10-CM | POA: Diagnosis not present

## 2014-03-24 DIAGNOSIS — Z87440 Personal history of urinary (tract) infections: Secondary | ICD-10-CM | POA: Diagnosis not present

## 2014-03-24 DIAGNOSIS — K819 Cholecystitis, unspecified: Secondary | ICD-10-CM | POA: Diagnosis not present

## 2014-03-24 DIAGNOSIS — E119 Type 2 diabetes mellitus without complications: Secondary | ICD-10-CM | POA: Diagnosis not present

## 2014-03-24 DIAGNOSIS — R1084 Generalized abdominal pain: Secondary | ICD-10-CM | POA: Diagnosis not present

## 2014-03-24 DIAGNOSIS — K812 Acute cholecystitis with chronic cholecystitis: Secondary | ICD-10-CM | POA: Diagnosis not present

## 2014-03-24 DIAGNOSIS — K7689 Other specified diseases of liver: Secondary | ICD-10-CM | POA: Diagnosis not present

## 2014-03-24 DIAGNOSIS — Z79899 Other long term (current) drug therapy: Secondary | ICD-10-CM | POA: Diagnosis not present

## 2014-03-24 DIAGNOSIS — R82998 Other abnormal findings in urine: Secondary | ICD-10-CM | POA: Diagnosis not present

## 2014-03-24 DIAGNOSIS — I10 Essential (primary) hypertension: Secondary | ICD-10-CM | POA: Diagnosis not present

## 2014-03-24 DIAGNOSIS — K8020 Calculus of gallbladder without cholecystitis without obstruction: Secondary | ICD-10-CM | POA: Diagnosis not present

## 2014-03-25 DIAGNOSIS — R109 Unspecified abdominal pain: Secondary | ICD-10-CM | POA: Diagnosis not present

## 2014-03-26 DIAGNOSIS — R109 Unspecified abdominal pain: Secondary | ICD-10-CM | POA: Diagnosis not present

## 2014-05-03 DIAGNOSIS — N200 Calculus of kidney: Secondary | ICD-10-CM | POA: Diagnosis not present

## 2014-05-03 DIAGNOSIS — N320 Bladder-neck obstruction: Secondary | ICD-10-CM | POA: Diagnosis not present

## 2014-05-03 DIAGNOSIS — R300 Dysuria: Secondary | ICD-10-CM | POA: Diagnosis not present

## 2014-05-03 DIAGNOSIS — R339 Retention of urine, unspecified: Secondary | ICD-10-CM | POA: Diagnosis not present

## 2014-05-04 DIAGNOSIS — N200 Calculus of kidney: Secondary | ICD-10-CM | POA: Diagnosis not present

## 2014-05-04 DIAGNOSIS — N320 Bladder-neck obstruction: Secondary | ICD-10-CM | POA: Diagnosis not present

## 2014-05-08 DIAGNOSIS — N320 Bladder-neck obstruction: Secondary | ICD-10-CM | POA: Diagnosis not present

## 2014-05-08 DIAGNOSIS — N200 Calculus of kidney: Secondary | ICD-10-CM | POA: Diagnosis not present

## 2014-05-08 DIAGNOSIS — R339 Retention of urine, unspecified: Secondary | ICD-10-CM | POA: Diagnosis not present

## 2014-05-16 DIAGNOSIS — Z823 Family history of stroke: Secondary | ICD-10-CM | POA: Diagnosis not present

## 2014-05-16 DIAGNOSIS — E119 Type 2 diabetes mellitus without complications: Secondary | ICD-10-CM | POA: Diagnosis not present

## 2014-05-16 DIAGNOSIS — Z87442 Personal history of urinary calculi: Secondary | ICD-10-CM | POA: Diagnosis not present

## 2014-05-16 DIAGNOSIS — N200 Calculus of kidney: Secondary | ICD-10-CM | POA: Diagnosis not present

## 2014-05-16 DIAGNOSIS — Z79899 Other long term (current) drug therapy: Secondary | ICD-10-CM | POA: Diagnosis not present

## 2014-05-16 DIAGNOSIS — Z883 Allergy status to other anti-infective agents status: Secondary | ICD-10-CM | POA: Diagnosis not present

## 2014-05-16 DIAGNOSIS — Z87440 Personal history of urinary (tract) infections: Secondary | ICD-10-CM | POA: Diagnosis not present

## 2014-05-16 DIAGNOSIS — N320 Bladder-neck obstruction: Secondary | ICD-10-CM | POA: Diagnosis not present

## 2014-05-16 DIAGNOSIS — K219 Gastro-esophageal reflux disease without esophagitis: Secondary | ICD-10-CM | POA: Diagnosis not present

## 2014-05-16 DIAGNOSIS — R339 Retention of urine, unspecified: Secondary | ICD-10-CM | POA: Diagnosis not present

## 2014-05-16 DIAGNOSIS — E785 Hyperlipidemia, unspecified: Secondary | ICD-10-CM | POA: Diagnosis not present

## 2014-05-16 DIAGNOSIS — Z882 Allergy status to sulfonamides status: Secondary | ICD-10-CM | POA: Diagnosis not present

## 2014-05-16 DIAGNOSIS — I10 Essential (primary) hypertension: Secondary | ICD-10-CM | POA: Diagnosis not present

## 2014-08-17 DIAGNOSIS — N320 Bladder-neck obstruction: Secondary | ICD-10-CM | POA: Diagnosis not present

## 2014-08-17 DIAGNOSIS — N200 Calculus of kidney: Secondary | ICD-10-CM | POA: Diagnosis not present

## 2014-08-17 DIAGNOSIS — R339 Retention of urine, unspecified: Secondary | ICD-10-CM | POA: Diagnosis not present

## 2014-08-21 DIAGNOSIS — N200 Calculus of kidney: Secondary | ICD-10-CM | POA: Diagnosis not present

## 2014-08-31 DIAGNOSIS — R339 Retention of urine, unspecified: Secondary | ICD-10-CM | POA: Diagnosis not present

## 2014-08-31 DIAGNOSIS — N390 Urinary tract infection, site not specified: Secondary | ICD-10-CM | POA: Diagnosis not present

## 2014-08-31 DIAGNOSIS — N320 Bladder-neck obstruction: Secondary | ICD-10-CM | POA: Diagnosis not present

## 2014-09-14 DIAGNOSIS — N390 Urinary tract infection, site not specified: Secondary | ICD-10-CM | POA: Diagnosis not present

## 2014-09-14 DIAGNOSIS — N200 Calculus of kidney: Secondary | ICD-10-CM | POA: Diagnosis not present

## 2014-09-14 DIAGNOSIS — N320 Bladder-neck obstruction: Secondary | ICD-10-CM | POA: Diagnosis not present

## 2014-09-18 DIAGNOSIS — N320 Bladder-neck obstruction: Secondary | ICD-10-CM | POA: Diagnosis not present

## 2014-09-19 DIAGNOSIS — N390 Urinary tract infection, site not specified: Secondary | ICD-10-CM | POA: Diagnosis not present

## 2014-09-19 DIAGNOSIS — E785 Hyperlipidemia, unspecified: Secondary | ICD-10-CM | POA: Diagnosis not present

## 2014-09-19 DIAGNOSIS — N200 Calculus of kidney: Secondary | ICD-10-CM | POA: Diagnosis not present

## 2014-09-19 DIAGNOSIS — Z9079 Acquired absence of other genital organ(s): Secondary | ICD-10-CM | POA: Diagnosis not present

## 2014-09-19 DIAGNOSIS — Z79899 Other long term (current) drug therapy: Secondary | ICD-10-CM | POA: Diagnosis not present

## 2014-09-19 DIAGNOSIS — Z883 Allergy status to other anti-infective agents status: Secondary | ICD-10-CM | POA: Diagnosis not present

## 2014-09-19 DIAGNOSIS — E119 Type 2 diabetes mellitus without complications: Secondary | ICD-10-CM | POA: Diagnosis not present

## 2014-09-19 DIAGNOSIS — L22 Diaper dermatitis: Secondary | ICD-10-CM | POA: Diagnosis not present

## 2014-09-19 DIAGNOSIS — K219 Gastro-esophageal reflux disease without esophagitis: Secondary | ICD-10-CM | POA: Diagnosis not present

## 2014-09-19 DIAGNOSIS — R32 Unspecified urinary incontinence: Secondary | ICD-10-CM | POA: Diagnosis not present

## 2014-09-19 DIAGNOSIS — I10 Essential (primary) hypertension: Secondary | ICD-10-CM | POA: Diagnosis not present

## 2014-09-19 DIAGNOSIS — R339 Retention of urine, unspecified: Secondary | ICD-10-CM | POA: Diagnosis not present

## 2014-09-19 DIAGNOSIS — Z6841 Body Mass Index (BMI) 40.0 and over, adult: Secondary | ICD-10-CM | POA: Diagnosis not present

## 2014-09-19 DIAGNOSIS — N320 Bladder-neck obstruction: Secondary | ICD-10-CM | POA: Diagnosis not present

## 2014-09-19 DIAGNOSIS — Z823 Family history of stroke: Secondary | ICD-10-CM | POA: Diagnosis not present

## 2014-09-19 DIAGNOSIS — E669 Obesity, unspecified: Secondary | ICD-10-CM | POA: Diagnosis not present

## 2014-10-05 DIAGNOSIS — R339 Retention of urine, unspecified: Secondary | ICD-10-CM | POA: Diagnosis not present

## 2014-10-05 DIAGNOSIS — N320 Bladder-neck obstruction: Secondary | ICD-10-CM | POA: Diagnosis not present

## 2014-10-24 DIAGNOSIS — K219 Gastro-esophageal reflux disease without esophagitis: Secondary | ICD-10-CM | POA: Diagnosis not present

## 2014-10-24 DIAGNOSIS — E780 Pure hypercholesterolemia: Secondary | ICD-10-CM | POA: Diagnosis not present

## 2014-10-24 DIAGNOSIS — E1165 Type 2 diabetes mellitus with hyperglycemia: Secondary | ICD-10-CM | POA: Diagnosis not present

## 2014-10-24 DIAGNOSIS — I10 Essential (primary) hypertension: Secondary | ICD-10-CM | POA: Diagnosis not present

## 2014-10-31 DIAGNOSIS — Z0001 Encounter for general adult medical examination with abnormal findings: Secondary | ICD-10-CM | POA: Diagnosis not present

## 2014-10-31 DIAGNOSIS — K219 Gastro-esophageal reflux disease without esophagitis: Secondary | ICD-10-CM | POA: Diagnosis not present

## 2014-10-31 DIAGNOSIS — E780 Pure hypercholesterolemia: Secondary | ICD-10-CM | POA: Diagnosis not present

## 2014-10-31 DIAGNOSIS — Z23 Encounter for immunization: Secondary | ICD-10-CM | POA: Diagnosis not present

## 2014-10-31 DIAGNOSIS — Z1389 Encounter for screening for other disorder: Secondary | ICD-10-CM | POA: Diagnosis not present

## 2014-10-31 DIAGNOSIS — I10 Essential (primary) hypertension: Secondary | ICD-10-CM | POA: Diagnosis not present

## 2014-11-01 DIAGNOSIS — L929 Granulomatous disorder of the skin and subcutaneous tissue, unspecified: Secondary | ICD-10-CM | POA: Diagnosis not present

## 2014-11-01 DIAGNOSIS — N320 Bladder-neck obstruction: Secondary | ICD-10-CM | POA: Diagnosis not present

## 2014-11-01 DIAGNOSIS — Z87440 Personal history of urinary (tract) infections: Secondary | ICD-10-CM | POA: Diagnosis not present

## 2014-12-03 DIAGNOSIS — L929 Granulomatous disorder of the skin and subcutaneous tissue, unspecified: Secondary | ICD-10-CM | POA: Diagnosis not present

## 2014-12-03 DIAGNOSIS — N320 Bladder-neck obstruction: Secondary | ICD-10-CM | POA: Diagnosis not present

## 2014-12-03 DIAGNOSIS — Z87440 Personal history of urinary (tract) infections: Secondary | ICD-10-CM | POA: Diagnosis not present

## 2015-01-01 DIAGNOSIS — N320 Bladder-neck obstruction: Secondary | ICD-10-CM | POA: Diagnosis not present

## 2015-01-03 DIAGNOSIS — N320 Bladder-neck obstruction: Secondary | ICD-10-CM | POA: Diagnosis not present

## 2015-01-03 DIAGNOSIS — Z9889 Other specified postprocedural states: Secondary | ICD-10-CM | POA: Diagnosis not present

## 2015-01-03 DIAGNOSIS — Z79899 Other long term (current) drug therapy: Secondary | ICD-10-CM | POA: Diagnosis not present

## 2015-01-03 DIAGNOSIS — Z87442 Personal history of urinary calculi: Secondary | ICD-10-CM | POA: Diagnosis not present

## 2015-01-03 DIAGNOSIS — Z883 Allergy status to other anti-infective agents status: Secondary | ICD-10-CM | POA: Diagnosis not present

## 2015-01-03 DIAGNOSIS — Z8546 Personal history of malignant neoplasm of prostate: Secondary | ICD-10-CM | POA: Diagnosis not present

## 2015-01-03 DIAGNOSIS — I10 Essential (primary) hypertension: Secondary | ICD-10-CM | POA: Diagnosis not present

## 2015-01-03 DIAGNOSIS — Z87440 Personal history of urinary (tract) infections: Secondary | ICD-10-CM | POA: Diagnosis not present

## 2015-01-03 DIAGNOSIS — E785 Hyperlipidemia, unspecified: Secondary | ICD-10-CM | POA: Diagnosis not present

## 2015-01-03 DIAGNOSIS — K219 Gastro-esophageal reflux disease without esophagitis: Secondary | ICD-10-CM | POA: Diagnosis not present

## 2015-01-03 DIAGNOSIS — N39498 Other specified urinary incontinence: Secondary | ICD-10-CM | POA: Diagnosis not present

## 2015-01-03 DIAGNOSIS — Z823 Family history of stroke: Secondary | ICD-10-CM | POA: Diagnosis not present

## 2015-01-03 DIAGNOSIS — Z9079 Acquired absence of other genital organ(s): Secondary | ICD-10-CM | POA: Diagnosis not present

## 2015-01-03 DIAGNOSIS — E119 Type 2 diabetes mellitus without complications: Secondary | ICD-10-CM | POA: Diagnosis not present

## 2015-02-05 DIAGNOSIS — N320 Bladder-neck obstruction: Secondary | ICD-10-CM | POA: Diagnosis not present

## 2015-02-05 DIAGNOSIS — N3281 Overactive bladder: Secondary | ICD-10-CM | POA: Diagnosis not present

## 2015-04-17 DIAGNOSIS — E1140 Type 2 diabetes mellitus with diabetic neuropathy, unspecified: Secondary | ICD-10-CM | POA: Diagnosis not present

## 2015-04-17 DIAGNOSIS — E780 Pure hypercholesterolemia: Secondary | ICD-10-CM | POA: Diagnosis not present

## 2015-04-17 DIAGNOSIS — K219 Gastro-esophageal reflux disease without esophagitis: Secondary | ICD-10-CM | POA: Diagnosis not present

## 2015-04-24 DIAGNOSIS — I10 Essential (primary) hypertension: Secondary | ICD-10-CM | POA: Diagnosis not present

## 2015-04-24 DIAGNOSIS — E780 Pure hypercholesterolemia: Secondary | ICD-10-CM | POA: Diagnosis not present

## 2015-04-24 DIAGNOSIS — K219 Gastro-esophageal reflux disease without esophagitis: Secondary | ICD-10-CM | POA: Diagnosis not present

## 2015-04-24 DIAGNOSIS — M545 Low back pain: Secondary | ICD-10-CM | POA: Diagnosis not present

## 2015-04-24 DIAGNOSIS — E1140 Type 2 diabetes mellitus with diabetic neuropathy, unspecified: Secondary | ICD-10-CM | POA: Diagnosis not present

## 2015-06-10 DIAGNOSIS — M545 Low back pain: Secondary | ICD-10-CM | POA: Diagnosis not present

## 2015-06-10 DIAGNOSIS — I10 Essential (primary) hypertension: Secondary | ICD-10-CM | POA: Diagnosis not present

## 2015-06-10 DIAGNOSIS — M5432 Sciatica, left side: Secondary | ICD-10-CM | POA: Diagnosis not present

## 2015-06-10 DIAGNOSIS — E1140 Type 2 diabetes mellitus with diabetic neuropathy, unspecified: Secondary | ICD-10-CM | POA: Diagnosis not present

## 2015-06-14 DIAGNOSIS — M5136 Other intervertebral disc degeneration, lumbar region: Secondary | ICD-10-CM | POA: Diagnosis not present

## 2015-06-14 DIAGNOSIS — M4806 Spinal stenosis, lumbar region: Secondary | ICD-10-CM | POA: Diagnosis not present

## 2015-06-14 DIAGNOSIS — M7138 Other bursal cyst, other site: Secondary | ICD-10-CM | POA: Diagnosis not present

## 2015-06-14 DIAGNOSIS — M47817 Spondylosis without myelopathy or radiculopathy, lumbosacral region: Secondary | ICD-10-CM | POA: Diagnosis not present

## 2015-06-14 DIAGNOSIS — C61 Malignant neoplasm of prostate: Secondary | ICD-10-CM | POA: Diagnosis not present

## 2015-06-14 DIAGNOSIS — M47816 Spondylosis without myelopathy or radiculopathy, lumbar region: Secondary | ICD-10-CM | POA: Diagnosis not present

## 2015-06-14 DIAGNOSIS — M545 Low back pain: Secondary | ICD-10-CM | POA: Diagnosis not present

## 2015-06-14 DIAGNOSIS — M5416 Radiculopathy, lumbar region: Secondary | ICD-10-CM | POA: Diagnosis not present

## 2015-06-19 DIAGNOSIS — M5416 Radiculopathy, lumbar region: Secondary | ICD-10-CM | POA: Diagnosis not present

## 2015-06-19 DIAGNOSIS — M545 Low back pain: Secondary | ICD-10-CM | POA: Diagnosis not present

## 2015-06-21 DIAGNOSIS — M545 Low back pain: Secondary | ICD-10-CM | POA: Diagnosis not present

## 2015-06-21 DIAGNOSIS — M5416 Radiculopathy, lumbar region: Secondary | ICD-10-CM | POA: Diagnosis not present

## 2015-06-25 DIAGNOSIS — M545 Low back pain: Secondary | ICD-10-CM | POA: Diagnosis not present

## 2015-06-25 DIAGNOSIS — M5416 Radiculopathy, lumbar region: Secondary | ICD-10-CM | POA: Diagnosis not present

## 2015-06-27 DIAGNOSIS — M545 Low back pain: Secondary | ICD-10-CM | POA: Diagnosis not present

## 2015-06-27 DIAGNOSIS — M5416 Radiculopathy, lumbar region: Secondary | ICD-10-CM | POA: Diagnosis not present

## 2015-07-02 DIAGNOSIS — M545 Low back pain: Secondary | ICD-10-CM | POA: Diagnosis not present

## 2015-07-02 DIAGNOSIS — M5416 Radiculopathy, lumbar region: Secondary | ICD-10-CM | POA: Diagnosis not present

## 2015-07-04 DIAGNOSIS — M5416 Radiculopathy, lumbar region: Secondary | ICD-10-CM | POA: Diagnosis not present

## 2015-07-04 DIAGNOSIS — M545 Low back pain: Secondary | ICD-10-CM | POA: Diagnosis not present

## 2015-07-08 DIAGNOSIS — M5416 Radiculopathy, lumbar region: Secondary | ICD-10-CM | POA: Diagnosis not present

## 2015-07-08 DIAGNOSIS — M545 Low back pain: Secondary | ICD-10-CM | POA: Diagnosis not present

## 2015-07-09 DIAGNOSIS — M48061 Spinal stenosis, lumbar region without neurogenic claudication: Secondary | ICD-10-CM | POA: Insufficient documentation

## 2015-07-11 DIAGNOSIS — M5416 Radiculopathy, lumbar region: Secondary | ICD-10-CM | POA: Diagnosis not present

## 2015-07-11 DIAGNOSIS — M545 Low back pain: Secondary | ICD-10-CM | POA: Diagnosis not present

## 2015-07-12 DIAGNOSIS — M4806 Spinal stenosis, lumbar region: Secondary | ICD-10-CM | POA: Diagnosis not present

## 2015-07-12 DIAGNOSIS — I10 Essential (primary) hypertension: Secondary | ICD-10-CM | POA: Diagnosis not present

## 2015-07-12 DIAGNOSIS — Z6841 Body Mass Index (BMI) 40.0 and over, adult: Secondary | ICD-10-CM | POA: Diagnosis not present

## 2015-08-13 DIAGNOSIS — M4806 Spinal stenosis, lumbar region: Secondary | ICD-10-CM | POA: Diagnosis not present

## 2015-09-10 DIAGNOSIS — M4806 Spinal stenosis, lumbar region: Secondary | ICD-10-CM | POA: Diagnosis not present

## 2015-09-11 DIAGNOSIS — Z23 Encounter for immunization: Secondary | ICD-10-CM | POA: Diagnosis not present

## 2015-09-12 DIAGNOSIS — N3281 Overactive bladder: Secondary | ICD-10-CM | POA: Diagnosis not present

## 2015-09-12 DIAGNOSIS — N320 Bladder-neck obstruction: Secondary | ICD-10-CM | POA: Diagnosis not present

## 2015-09-17 DIAGNOSIS — N320 Bladder-neck obstruction: Secondary | ICD-10-CM | POA: Diagnosis not present

## 2015-09-17 DIAGNOSIS — N3281 Overactive bladder: Secondary | ICD-10-CM | POA: Diagnosis not present

## 2015-10-08 DIAGNOSIS — M4806 Spinal stenosis, lumbar region: Secondary | ICD-10-CM | POA: Diagnosis not present

## 2015-11-07 DIAGNOSIS — N320 Bladder-neck obstruction: Secondary | ICD-10-CM | POA: Diagnosis not present

## 2015-11-07 DIAGNOSIS — N3281 Overactive bladder: Secondary | ICD-10-CM | POA: Diagnosis not present

## 2015-11-07 DIAGNOSIS — Z87440 Personal history of urinary (tract) infections: Secondary | ICD-10-CM | POA: Diagnosis not present

## 2015-11-29 DIAGNOSIS — E7800 Pure hypercholesterolemia, unspecified: Secondary | ICD-10-CM | POA: Diagnosis not present

## 2015-11-29 DIAGNOSIS — E1140 Type 2 diabetes mellitus with diabetic neuropathy, unspecified: Secondary | ICD-10-CM | POA: Diagnosis not present

## 2015-11-29 DIAGNOSIS — I10 Essential (primary) hypertension: Secondary | ICD-10-CM | POA: Diagnosis not present

## 2015-12-05 DIAGNOSIS — E7800 Pure hypercholesterolemia, unspecified: Secondary | ICD-10-CM | POA: Diagnosis not present

## 2015-12-05 DIAGNOSIS — K219 Gastro-esophageal reflux disease without esophagitis: Secondary | ICD-10-CM | POA: Diagnosis not present

## 2015-12-05 DIAGNOSIS — M5432 Sciatica, left side: Secondary | ICD-10-CM | POA: Diagnosis not present

## 2015-12-05 DIAGNOSIS — Z0001 Encounter for general adult medical examination with abnormal findings: Secondary | ICD-10-CM | POA: Diagnosis not present

## 2015-12-05 DIAGNOSIS — E1140 Type 2 diabetes mellitus with diabetic neuropathy, unspecified: Secondary | ICD-10-CM | POA: Diagnosis not present

## 2015-12-05 DIAGNOSIS — I10 Essential (primary) hypertension: Secondary | ICD-10-CM | POA: Diagnosis not present

## 2015-12-05 DIAGNOSIS — Z1389 Encounter for screening for other disorder: Secondary | ICD-10-CM | POA: Diagnosis not present

## 2015-12-05 DIAGNOSIS — M545 Low back pain: Secondary | ICD-10-CM | POA: Diagnosis not present

## 2015-12-10 DIAGNOSIS — M4806 Spinal stenosis, lumbar region: Secondary | ICD-10-CM | POA: Diagnosis not present

## 2016-01-07 DIAGNOSIS — M4806 Spinal stenosis, lumbar region: Secondary | ICD-10-CM | POA: Diagnosis not present

## 2016-01-17 DIAGNOSIS — R300 Dysuria: Secondary | ICD-10-CM | POA: Diagnosis not present

## 2016-01-17 DIAGNOSIS — N320 Bladder-neck obstruction: Secondary | ICD-10-CM | POA: Diagnosis not present

## 2016-01-31 DIAGNOSIS — N320 Bladder-neck obstruction: Secondary | ICD-10-CM | POA: Diagnosis not present

## 2016-01-31 DIAGNOSIS — Z87440 Personal history of urinary (tract) infections: Secondary | ICD-10-CM | POA: Diagnosis not present

## 2016-02-03 DIAGNOSIS — Z1211 Encounter for screening for malignant neoplasm of colon: Secondary | ICD-10-CM | POA: Diagnosis not present

## 2016-02-20 DIAGNOSIS — K5730 Diverticulosis of large intestine without perforation or abscess without bleeding: Secondary | ICD-10-CM | POA: Diagnosis not present

## 2016-02-20 DIAGNOSIS — I10 Essential (primary) hypertension: Secondary | ICD-10-CM | POA: Diagnosis not present

## 2016-02-20 DIAGNOSIS — Z8546 Personal history of malignant neoplasm of prostate: Secondary | ICD-10-CM | POA: Diagnosis not present

## 2016-02-20 DIAGNOSIS — Z79899 Other long term (current) drug therapy: Secondary | ICD-10-CM | POA: Diagnosis not present

## 2016-02-20 DIAGNOSIS — Z87442 Personal history of urinary calculi: Secondary | ICD-10-CM | POA: Diagnosis not present

## 2016-02-20 DIAGNOSIS — K219 Gastro-esophageal reflux disease without esophagitis: Secondary | ICD-10-CM | POA: Diagnosis not present

## 2016-02-20 DIAGNOSIS — D126 Benign neoplasm of colon, unspecified: Secondary | ICD-10-CM | POA: Diagnosis not present

## 2016-02-20 DIAGNOSIS — K635 Polyp of colon: Secondary | ICD-10-CM | POA: Diagnosis not present

## 2016-02-20 DIAGNOSIS — Z8249 Family history of ischemic heart disease and other diseases of the circulatory system: Secondary | ICD-10-CM | POA: Diagnosis not present

## 2016-02-20 DIAGNOSIS — Z9049 Acquired absence of other specified parts of digestive tract: Secondary | ICD-10-CM | POA: Diagnosis not present

## 2016-02-20 DIAGNOSIS — Z86010 Personal history of colonic polyps: Secondary | ICD-10-CM | POA: Diagnosis not present

## 2016-02-20 DIAGNOSIS — E119 Type 2 diabetes mellitus without complications: Secondary | ICD-10-CM | POA: Diagnosis not present

## 2016-02-20 DIAGNOSIS — Z7984 Long term (current) use of oral hypoglycemic drugs: Secondary | ICD-10-CM | POA: Diagnosis not present

## 2016-02-20 DIAGNOSIS — Z1211 Encounter for screening for malignant neoplasm of colon: Secondary | ICD-10-CM | POA: Diagnosis not present

## 2016-02-20 DIAGNOSIS — Z882 Allergy status to sulfonamides status: Secondary | ICD-10-CM | POA: Diagnosis not present

## 2016-02-20 DIAGNOSIS — Z85828 Personal history of other malignant neoplasm of skin: Secondary | ICD-10-CM | POA: Diagnosis not present

## 2016-03-02 DIAGNOSIS — M4806 Spinal stenosis, lumbar region: Secondary | ICD-10-CM | POA: Diagnosis not present

## 2016-04-02 DIAGNOSIS — Z6841 Body Mass Index (BMI) 40.0 and over, adult: Secondary | ICD-10-CM | POA: Diagnosis not present

## 2016-04-02 DIAGNOSIS — M4806 Spinal stenosis, lumbar region: Secondary | ICD-10-CM | POA: Diagnosis not present

## 2016-04-08 DIAGNOSIS — Z01812 Encounter for preprocedural laboratory examination: Secondary | ICD-10-CM | POA: Diagnosis not present

## 2016-04-08 DIAGNOSIS — M4806 Spinal stenosis, lumbar region: Secondary | ICD-10-CM | POA: Diagnosis not present

## 2016-04-15 DIAGNOSIS — M5127 Other intervertebral disc displacement, lumbosacral region: Secondary | ICD-10-CM | POA: Diagnosis not present

## 2016-04-15 DIAGNOSIS — M4806 Spinal stenosis, lumbar region: Secondary | ICD-10-CM | POA: Diagnosis not present

## 2016-05-29 DIAGNOSIS — R609 Edema, unspecified: Secondary | ICD-10-CM | POA: Diagnosis not present

## 2016-06-03 DIAGNOSIS — K219 Gastro-esophageal reflux disease without esophagitis: Secondary | ICD-10-CM | POA: Diagnosis not present

## 2016-06-03 DIAGNOSIS — E1140 Type 2 diabetes mellitus with diabetic neuropathy, unspecified: Secondary | ICD-10-CM | POA: Diagnosis not present

## 2016-06-03 DIAGNOSIS — E7800 Pure hypercholesterolemia, unspecified: Secondary | ICD-10-CM | POA: Diagnosis not present

## 2016-06-03 DIAGNOSIS — I10 Essential (primary) hypertension: Secondary | ICD-10-CM | POA: Diagnosis not present

## 2016-06-08 DIAGNOSIS — E1140 Type 2 diabetes mellitus with diabetic neuropathy, unspecified: Secondary | ICD-10-CM | POA: Diagnosis not present

## 2016-06-08 DIAGNOSIS — R609 Edema, unspecified: Secondary | ICD-10-CM | POA: Diagnosis not present

## 2016-06-25 DIAGNOSIS — R339 Retention of urine, unspecified: Secondary | ICD-10-CM | POA: Diagnosis not present

## 2016-06-25 DIAGNOSIS — R300 Dysuria: Secondary | ICD-10-CM | POA: Diagnosis not present

## 2016-07-30 DIAGNOSIS — R300 Dysuria: Secondary | ICD-10-CM | POA: Diagnosis not present

## 2016-07-30 DIAGNOSIS — Z87440 Personal history of urinary (tract) infections: Secondary | ICD-10-CM | POA: Diagnosis not present

## 2016-08-13 DIAGNOSIS — Z87440 Personal history of urinary (tract) infections: Secondary | ICD-10-CM | POA: Diagnosis not present

## 2016-08-13 DIAGNOSIS — C61 Malignant neoplasm of prostate: Secondary | ICD-10-CM | POA: Diagnosis not present

## 2016-08-13 DIAGNOSIS — N3281 Overactive bladder: Secondary | ICD-10-CM | POA: Diagnosis not present

## 2016-08-13 DIAGNOSIS — N320 Bladder-neck obstruction: Secondary | ICD-10-CM | POA: Diagnosis not present

## 2016-08-17 ENCOUNTER — Other Ambulatory Visit: Payer: Self-pay

## 2016-08-17 DIAGNOSIS — N320 Bladder-neck obstruction: Secondary | ICD-10-CM | POA: Diagnosis not present

## 2016-08-17 DIAGNOSIS — C61 Malignant neoplasm of prostate: Secondary | ICD-10-CM | POA: Diagnosis not present

## 2016-08-17 DIAGNOSIS — N3281 Overactive bladder: Secondary | ICD-10-CM | POA: Diagnosis not present

## 2016-10-01 DIAGNOSIS — Z23 Encounter for immunization: Secondary | ICD-10-CM | POA: Diagnosis not present

## 2016-12-11 DIAGNOSIS — I10 Essential (primary) hypertension: Secondary | ICD-10-CM | POA: Diagnosis not present

## 2016-12-11 DIAGNOSIS — K219 Gastro-esophageal reflux disease without esophagitis: Secondary | ICD-10-CM | POA: Diagnosis not present

## 2016-12-11 DIAGNOSIS — E1140 Type 2 diabetes mellitus with diabetic neuropathy, unspecified: Secondary | ICD-10-CM | POA: Diagnosis not present

## 2016-12-11 DIAGNOSIS — E7800 Pure hypercholesterolemia, unspecified: Secondary | ICD-10-CM | POA: Diagnosis not present

## 2016-12-16 DIAGNOSIS — M545 Low back pain: Secondary | ICD-10-CM | POA: Diagnosis not present

## 2016-12-16 DIAGNOSIS — Z1389 Encounter for screening for other disorder: Secondary | ICD-10-CM | POA: Diagnosis not present

## 2016-12-16 DIAGNOSIS — E1140 Type 2 diabetes mellitus with diabetic neuropathy, unspecified: Secondary | ICD-10-CM | POA: Diagnosis not present

## 2016-12-16 DIAGNOSIS — E7800 Pure hypercholesterolemia, unspecified: Secondary | ICD-10-CM | POA: Diagnosis not present

## 2016-12-16 DIAGNOSIS — Z0001 Encounter for general adult medical examination with abnormal findings: Secondary | ICD-10-CM | POA: Diagnosis not present

## 2016-12-16 DIAGNOSIS — Z6838 Body mass index (BMI) 38.0-38.9, adult: Secondary | ICD-10-CM | POA: Diagnosis not present

## 2016-12-16 DIAGNOSIS — I10 Essential (primary) hypertension: Secondary | ICD-10-CM | POA: Diagnosis not present

## 2016-12-18 DIAGNOSIS — R102 Pelvic and perineal pain: Secondary | ICD-10-CM | POA: Diagnosis not present

## 2016-12-18 DIAGNOSIS — R8290 Unspecified abnormal findings in urine: Secondary | ICD-10-CM | POA: Diagnosis not present

## 2017-01-01 DIAGNOSIS — N320 Bladder-neck obstruction: Secondary | ICD-10-CM | POA: Diagnosis not present

## 2017-01-01 DIAGNOSIS — N3281 Overactive bladder: Secondary | ICD-10-CM | POA: Diagnosis not present

## 2017-01-01 DIAGNOSIS — C61 Malignant neoplasm of prostate: Secondary | ICD-10-CM | POA: Diagnosis not present

## 2017-03-31 ENCOUNTER — Ambulatory Visit (INDEPENDENT_AMBULATORY_CARE_PROVIDER_SITE_OTHER): Payer: Medicare Other | Admitting: Urology

## 2017-03-31 DIAGNOSIS — R338 Other retention of urine: Secondary | ICD-10-CM | POA: Diagnosis not present

## 2017-03-31 DIAGNOSIS — C61 Malignant neoplasm of prostate: Secondary | ICD-10-CM | POA: Diagnosis not present

## 2017-03-31 DIAGNOSIS — N3000 Acute cystitis without hematuria: Secondary | ICD-10-CM

## 2017-06-11 DIAGNOSIS — I10 Essential (primary) hypertension: Secondary | ICD-10-CM | POA: Diagnosis not present

## 2017-06-11 DIAGNOSIS — Z6839 Body mass index (BMI) 39.0-39.9, adult: Secondary | ICD-10-CM | POA: Diagnosis not present

## 2017-06-11 DIAGNOSIS — E1140 Type 2 diabetes mellitus with diabetic neuropathy, unspecified: Secondary | ICD-10-CM | POA: Diagnosis not present

## 2017-06-30 ENCOUNTER — Other Ambulatory Visit: Payer: Self-pay | Admitting: Urology

## 2017-06-30 DIAGNOSIS — R339 Retention of urine, unspecified: Secondary | ICD-10-CM

## 2017-06-30 DIAGNOSIS — R338 Other retention of urine: Secondary | ICD-10-CM

## 2017-07-20 ENCOUNTER — Ambulatory Visit (HOSPITAL_COMMUNITY)
Admission: RE | Admit: 2017-07-20 | Discharge: 2017-07-20 | Disposition: A | Discharge status: Home / Self Care | Payer: Medicare Other | Source: Ambulatory Visit | Attending: Urology | Admitting: Urology

## 2017-07-20 DIAGNOSIS — R339 Retention of urine, unspecified: Secondary | ICD-10-CM | POA: Diagnosis not present

## 2017-07-28 ENCOUNTER — Ambulatory Visit (INDEPENDENT_AMBULATORY_CARE_PROVIDER_SITE_OTHER): Payer: Medicare Other | Admitting: Urology

## 2017-07-28 DIAGNOSIS — R338 Other retention of urine: Secondary | ICD-10-CM

## 2017-07-28 DIAGNOSIS — N3020 Other chronic cystitis without hematuria: Secondary | ICD-10-CM

## 2017-09-21 DIAGNOSIS — N320 Bladder-neck obstruction: Secondary | ICD-10-CM | POA: Diagnosis not present

## 2017-09-21 DIAGNOSIS — R338 Other retention of urine: Secondary | ICD-10-CM | POA: Diagnosis not present

## 2017-09-24 DIAGNOSIS — Z23 Encounter for immunization: Secondary | ICD-10-CM | POA: Diagnosis not present

## 2017-12-28 DIAGNOSIS — E7800 Pure hypercholesterolemia, unspecified: Secondary | ICD-10-CM | POA: Diagnosis not present

## 2017-12-28 DIAGNOSIS — Z6841 Body Mass Index (BMI) 40.0 and over, adult: Secondary | ICD-10-CM | POA: Diagnosis not present

## 2017-12-28 DIAGNOSIS — Z0001 Encounter for general adult medical examination with abnormal findings: Secondary | ICD-10-CM | POA: Diagnosis not present

## 2017-12-28 DIAGNOSIS — I10 Essential (primary) hypertension: Secondary | ICD-10-CM | POA: Diagnosis not present

## 2017-12-28 DIAGNOSIS — E1140 Type 2 diabetes mellitus with diabetic neuropathy, unspecified: Secondary | ICD-10-CM | POA: Diagnosis not present

## 2017-12-28 DIAGNOSIS — K219 Gastro-esophageal reflux disease without esophagitis: Secondary | ICD-10-CM | POA: Diagnosis not present

## 2017-12-29 DIAGNOSIS — H02102 Unspecified ectropion of right lower eyelid: Secondary | ICD-10-CM | POA: Diagnosis not present

## 2017-12-29 DIAGNOSIS — H02105 Unspecified ectropion of left lower eyelid: Secondary | ICD-10-CM | POA: Diagnosis not present

## 2018-01-05 ENCOUNTER — Ambulatory Visit (INDEPENDENT_AMBULATORY_CARE_PROVIDER_SITE_OTHER): Payer: Medicare Other | Admitting: Urology

## 2018-01-05 DIAGNOSIS — R338 Other retention of urine: Secondary | ICD-10-CM

## 2018-01-05 DIAGNOSIS — N3000 Acute cystitis without hematuria: Secondary | ICD-10-CM | POA: Diagnosis not present

## 2018-01-13 DIAGNOSIS — H35033 Hypertensive retinopathy, bilateral: Secondary | ICD-10-CM | POA: Diagnosis not present

## 2018-01-13 DIAGNOSIS — H25013 Cortical age-related cataract, bilateral: Secondary | ICD-10-CM | POA: Diagnosis not present

## 2018-01-13 DIAGNOSIS — H02105 Unspecified ectropion of left lower eyelid: Secondary | ICD-10-CM | POA: Diagnosis not present

## 2018-01-13 DIAGNOSIS — H2513 Age-related nuclear cataract, bilateral: Secondary | ICD-10-CM | POA: Diagnosis not present

## 2018-01-13 DIAGNOSIS — E119 Type 2 diabetes mellitus without complications: Secondary | ICD-10-CM | POA: Diagnosis not present

## 2018-02-10 DIAGNOSIS — H02105 Unspecified ectropion of left lower eyelid: Secondary | ICD-10-CM | POA: Diagnosis not present

## 2018-02-10 DIAGNOSIS — H04123 Dry eye syndrome of bilateral lacrimal glands: Secondary | ICD-10-CM | POA: Diagnosis not present

## 2018-02-10 DIAGNOSIS — H02102 Unspecified ectropion of right lower eyelid: Secondary | ICD-10-CM | POA: Diagnosis not present

## 2018-02-18 DIAGNOSIS — E119 Type 2 diabetes mellitus without complications: Secondary | ICD-10-CM | POA: Insufficient documentation

## 2018-02-18 DIAGNOSIS — I10 Essential (primary) hypertension: Secondary | ICD-10-CM | POA: Insufficient documentation

## 2018-02-24 DIAGNOSIS — H02135 Senile ectropion of left lower eyelid: Secondary | ICD-10-CM | POA: Diagnosis not present

## 2018-02-24 DIAGNOSIS — H02413 Mechanical ptosis of bilateral eyelids: Secondary | ICD-10-CM | POA: Diagnosis not present

## 2018-02-24 DIAGNOSIS — H02423 Myogenic ptosis of bilateral eyelids: Secondary | ICD-10-CM | POA: Diagnosis not present

## 2018-02-24 DIAGNOSIS — H02112 Cicatricial ectropion of right lower eyelid: Secondary | ICD-10-CM | POA: Diagnosis not present

## 2018-02-24 DIAGNOSIS — H0270 Unspecified degenerative disorders of eyelid and periocular area: Secondary | ICD-10-CM | POA: Diagnosis not present

## 2018-02-24 DIAGNOSIS — H02115 Cicatricial ectropion of left lower eyelid: Secondary | ICD-10-CM | POA: Diagnosis not present

## 2018-03-31 DIAGNOSIS — H02413 Mechanical ptosis of bilateral eyelids: Secondary | ICD-10-CM | POA: Diagnosis not present

## 2018-03-31 DIAGNOSIS — H02112 Cicatricial ectropion of right lower eyelid: Secondary | ICD-10-CM | POA: Diagnosis not present

## 2018-03-31 DIAGNOSIS — H02535 Eyelid retraction left lower eyelid: Secondary | ICD-10-CM | POA: Diagnosis not present

## 2018-03-31 DIAGNOSIS — H0530 Unspecified deformity of orbit: Secondary | ICD-10-CM | POA: Diagnosis not present

## 2018-03-31 DIAGNOSIS — H02115 Cicatricial ectropion of left lower eyelid: Secondary | ICD-10-CM | POA: Diagnosis not present

## 2018-03-31 DIAGNOSIS — H04562 Stenosis of left lacrimal punctum: Secondary | ICD-10-CM | POA: Diagnosis not present

## 2018-03-31 DIAGNOSIS — H0270 Unspecified degenerative disorders of eyelid and periocular area: Secondary | ICD-10-CM | POA: Diagnosis not present

## 2018-03-31 DIAGNOSIS — H02423 Myogenic ptosis of bilateral eyelids: Secondary | ICD-10-CM | POA: Diagnosis not present

## 2018-03-31 DIAGNOSIS — H02135 Senile ectropion of left lower eyelid: Secondary | ICD-10-CM | POA: Diagnosis not present

## 2018-04-18 DIAGNOSIS — H02112 Cicatricial ectropion of right lower eyelid: Secondary | ICD-10-CM | POA: Diagnosis not present

## 2018-04-18 DIAGNOSIS — H0270 Unspecified degenerative disorders of eyelid and periocular area: Secondary | ICD-10-CM | POA: Diagnosis not present

## 2018-04-18 DIAGNOSIS — H02115 Cicatricial ectropion of left lower eyelid: Secondary | ICD-10-CM | POA: Diagnosis not present

## 2018-04-18 DIAGNOSIS — H02423 Myogenic ptosis of bilateral eyelids: Secondary | ICD-10-CM | POA: Diagnosis not present

## 2018-04-18 DIAGNOSIS — H02413 Mechanical ptosis of bilateral eyelids: Secondary | ICD-10-CM | POA: Diagnosis not present

## 2018-04-18 DIAGNOSIS — Z09 Encounter for follow-up examination after completed treatment for conditions other than malignant neoplasm: Secondary | ICD-10-CM | POA: Diagnosis not present

## 2018-04-18 DIAGNOSIS — H02135 Senile ectropion of left lower eyelid: Secondary | ICD-10-CM | POA: Diagnosis not present

## 2018-04-20 DIAGNOSIS — H53489 Generalized contraction of visual field, unspecified eye: Secondary | ICD-10-CM | POA: Diagnosis not present

## 2018-05-25 DIAGNOSIS — H02135 Senile ectropion of left lower eyelid: Secondary | ICD-10-CM | POA: Diagnosis not present

## 2018-05-25 DIAGNOSIS — H02423 Myogenic ptosis of bilateral eyelids: Secondary | ICD-10-CM | POA: Diagnosis not present

## 2018-05-25 DIAGNOSIS — Z09 Encounter for follow-up examination after completed treatment for conditions other than malignant neoplasm: Secondary | ICD-10-CM | POA: Diagnosis not present

## 2018-05-25 DIAGNOSIS — H02112 Cicatricial ectropion of right lower eyelid: Secondary | ICD-10-CM | POA: Diagnosis not present

## 2018-05-25 DIAGNOSIS — H02413 Mechanical ptosis of bilateral eyelids: Secondary | ICD-10-CM | POA: Diagnosis not present

## 2018-05-25 DIAGNOSIS — H02115 Cicatricial ectropion of left lower eyelid: Secondary | ICD-10-CM | POA: Diagnosis not present

## 2018-07-20 ENCOUNTER — Ambulatory Visit (INDEPENDENT_AMBULATORY_CARE_PROVIDER_SITE_OTHER): Payer: Medicare Other | Admitting: Urology

## 2018-07-20 DIAGNOSIS — N3000 Acute cystitis without hematuria: Secondary | ICD-10-CM | POA: Diagnosis not present

## 2018-07-20 DIAGNOSIS — R338 Other retention of urine: Secondary | ICD-10-CM

## 2018-07-27 DIAGNOSIS — H02423 Myogenic ptosis of bilateral eyelids: Secondary | ICD-10-CM | POA: Diagnosis not present

## 2018-07-27 DIAGNOSIS — H02115 Cicatricial ectropion of left lower eyelid: Secondary | ICD-10-CM | POA: Diagnosis not present

## 2018-07-27 DIAGNOSIS — H02135 Senile ectropion of left lower eyelid: Secondary | ICD-10-CM | POA: Diagnosis not present

## 2018-07-27 DIAGNOSIS — H02132 Senile ectropion of right lower eyelid: Secondary | ICD-10-CM | POA: Diagnosis not present

## 2018-07-27 DIAGNOSIS — H02112 Cicatricial ectropion of right lower eyelid: Secondary | ICD-10-CM | POA: Diagnosis not present

## 2018-07-27 DIAGNOSIS — Z09 Encounter for follow-up examination after completed treatment for conditions other than malignant neoplasm: Secondary | ICD-10-CM | POA: Diagnosis not present

## 2018-07-27 DIAGNOSIS — H02413 Mechanical ptosis of bilateral eyelids: Secondary | ICD-10-CM | POA: Diagnosis not present

## 2018-07-27 DIAGNOSIS — H02532 Eyelid retraction right lower eyelid: Secondary | ICD-10-CM | POA: Diagnosis not present

## 2018-08-01 DIAGNOSIS — E782 Mixed hyperlipidemia: Secondary | ICD-10-CM | POA: Diagnosis not present

## 2018-08-01 DIAGNOSIS — E1140 Type 2 diabetes mellitus with diabetic neuropathy, unspecified: Secondary | ICD-10-CM | POA: Diagnosis not present

## 2018-08-01 DIAGNOSIS — Z6838 Body mass index (BMI) 38.0-38.9, adult: Secondary | ICD-10-CM | POA: Diagnosis not present

## 2018-08-01 DIAGNOSIS — I10 Essential (primary) hypertension: Secondary | ICD-10-CM | POA: Diagnosis not present

## 2018-08-03 DIAGNOSIS — Z09 Encounter for follow-up examination after completed treatment for conditions other than malignant neoplasm: Secondary | ICD-10-CM | POA: Diagnosis not present

## 2018-08-03 DIAGNOSIS — H02112 Cicatricial ectropion of right lower eyelid: Secondary | ICD-10-CM | POA: Diagnosis not present

## 2018-08-03 DIAGNOSIS — H02413 Mechanical ptosis of bilateral eyelids: Secondary | ICD-10-CM | POA: Diagnosis not present

## 2018-08-03 DIAGNOSIS — H02132 Senile ectropion of right lower eyelid: Secondary | ICD-10-CM | POA: Diagnosis not present

## 2018-08-03 DIAGNOSIS — H02115 Cicatricial ectropion of left lower eyelid: Secondary | ICD-10-CM | POA: Diagnosis not present

## 2018-08-03 DIAGNOSIS — H02423 Myogenic ptosis of bilateral eyelids: Secondary | ICD-10-CM | POA: Diagnosis not present

## 2018-08-03 DIAGNOSIS — H02135 Senile ectropion of left lower eyelid: Secondary | ICD-10-CM | POA: Diagnosis not present

## 2018-09-09 DIAGNOSIS — Z23 Encounter for immunization: Secondary | ICD-10-CM | POA: Diagnosis not present

## 2018-10-25 DIAGNOSIS — I10 Essential (primary) hypertension: Secondary | ICD-10-CM | POA: Diagnosis not present

## 2018-10-25 DIAGNOSIS — E782 Mixed hyperlipidemia: Secondary | ICD-10-CM | POA: Diagnosis not present

## 2018-10-25 DIAGNOSIS — Z1389 Encounter for screening for other disorder: Secondary | ICD-10-CM | POA: Diagnosis not present

## 2018-10-25 DIAGNOSIS — E1140 Type 2 diabetes mellitus with diabetic neuropathy, unspecified: Secondary | ICD-10-CM | POA: Diagnosis not present

## 2018-10-25 DIAGNOSIS — Z1331 Encounter for screening for depression: Secondary | ICD-10-CM | POA: Diagnosis not present

## 2018-10-25 DIAGNOSIS — Z6837 Body mass index (BMI) 37.0-37.9, adult: Secondary | ICD-10-CM | POA: Diagnosis not present

## 2018-11-04 IMAGING — US US RENAL
1 series · 14 of 25 positions shown · non-contrast
Comparison: None.

CLINICAL DATA: 71-year-old male with urinary retention. Self
catheterization. Initial encounter.

EXAM:
RENAL / URINARY TRACT ULTRASOUND COMPLETE

[Series 1: us renal · 0.23mm/px · 14 of 49 slices shown]
[im 1/49]
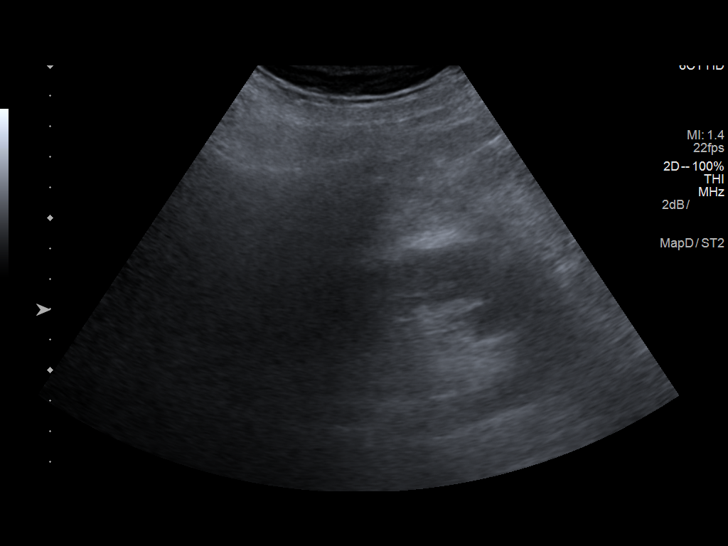
[im 5/49]
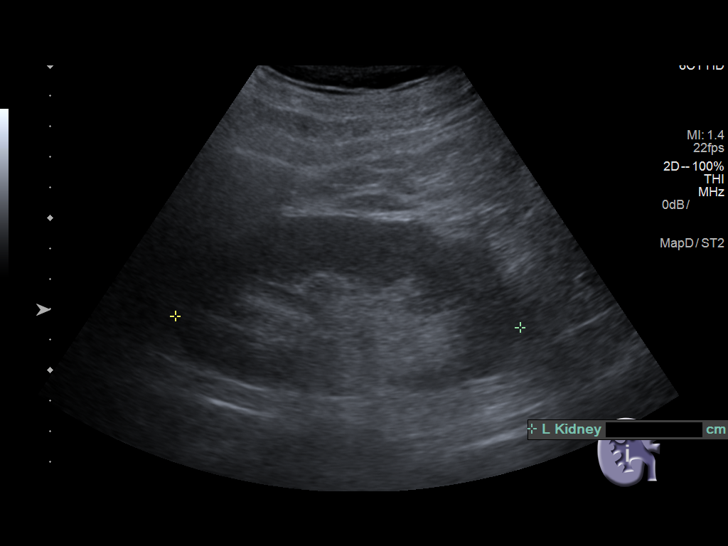
[im 9/49]
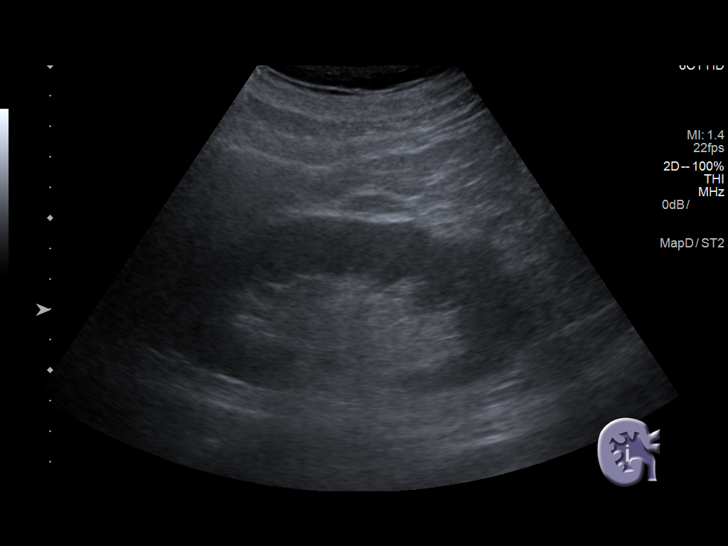
[im 13/49]
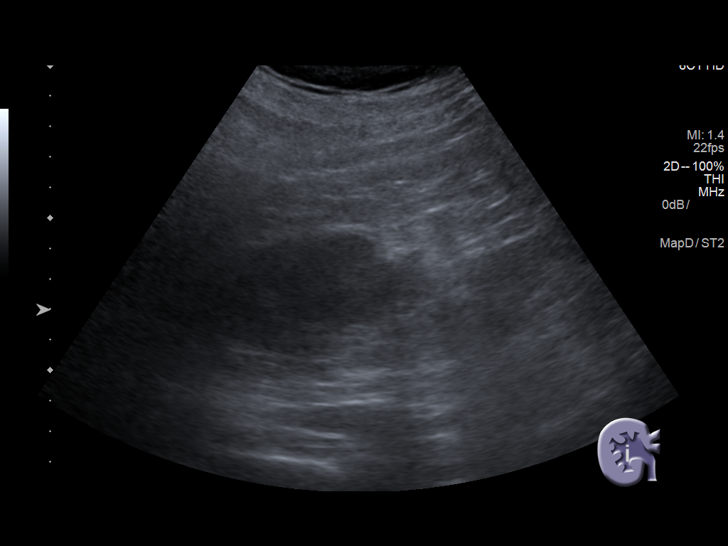
[im 17/49]
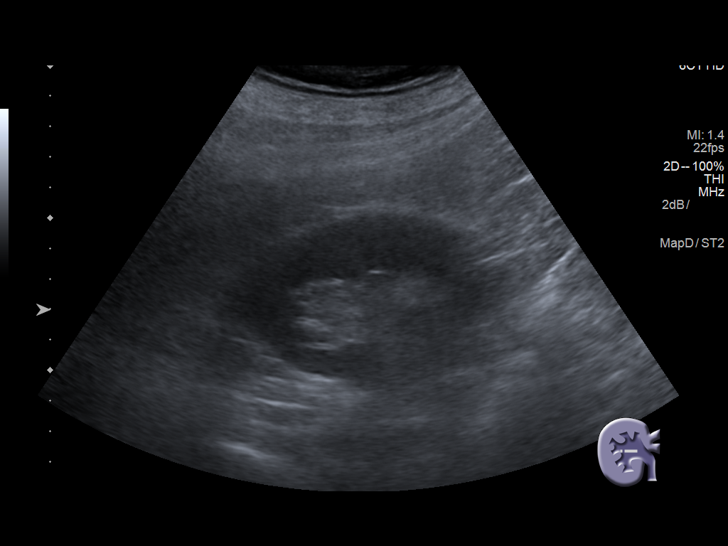
[im 19/49]
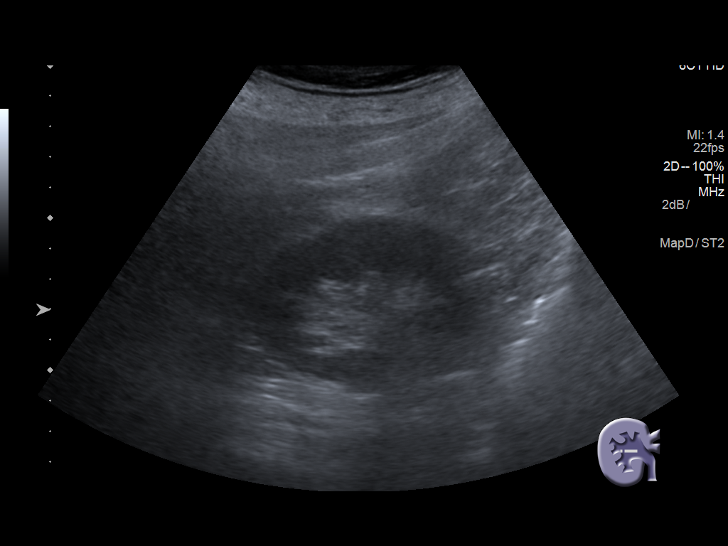
[im 23/49]
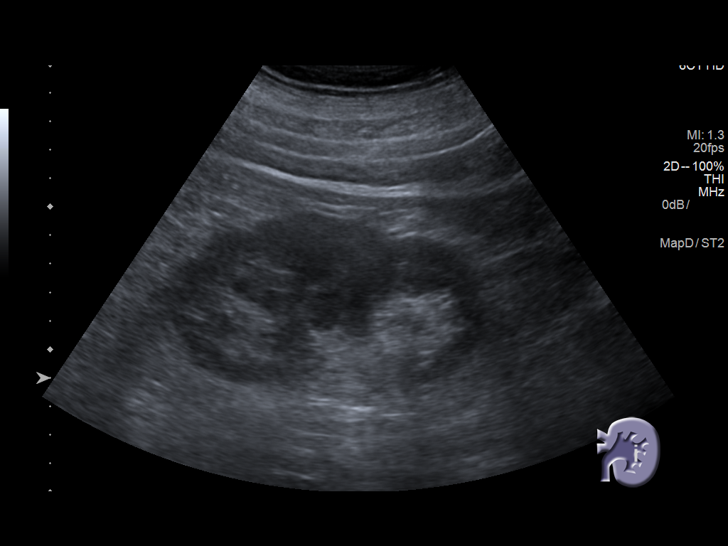
[im 27/49]
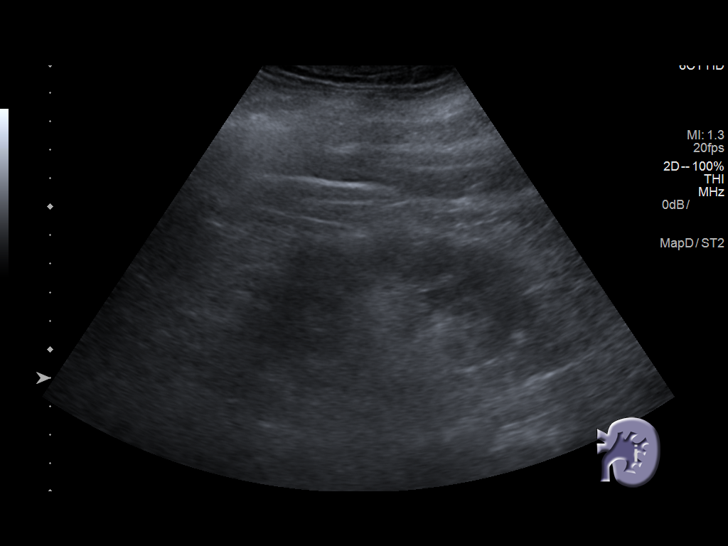
[im 31/49]
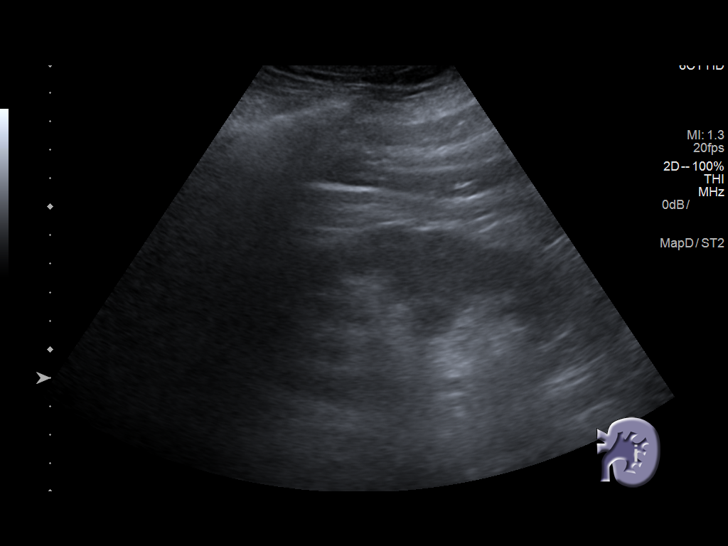
[im 33/49]
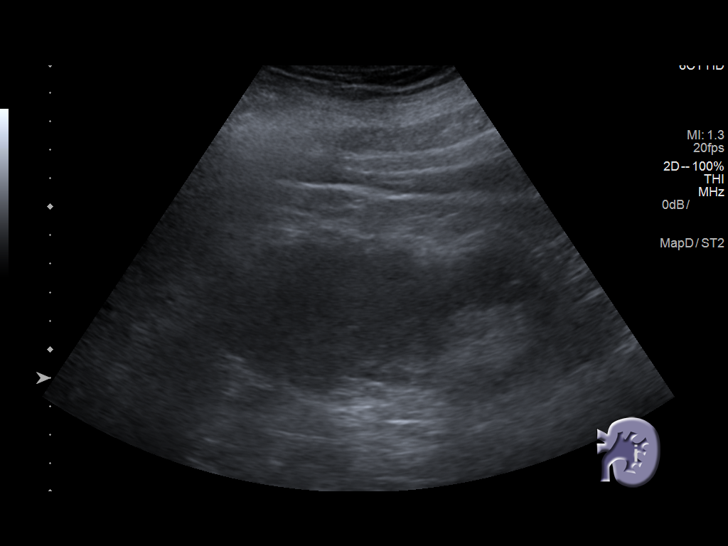
[im 37/49]
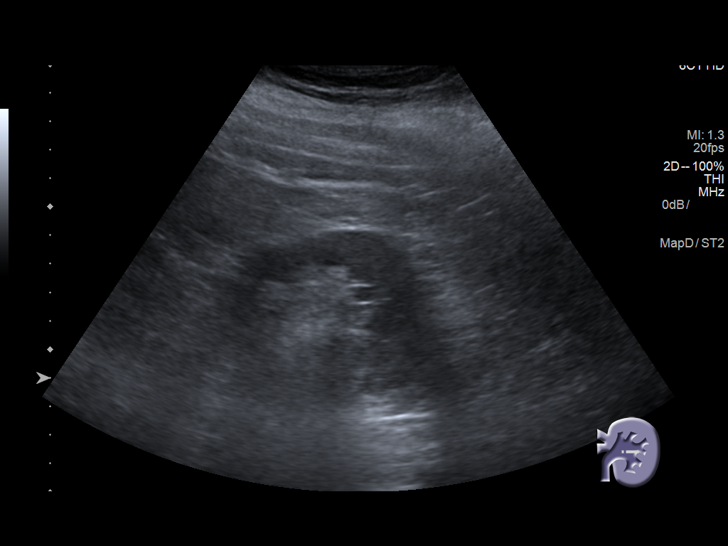
[im 41/49]
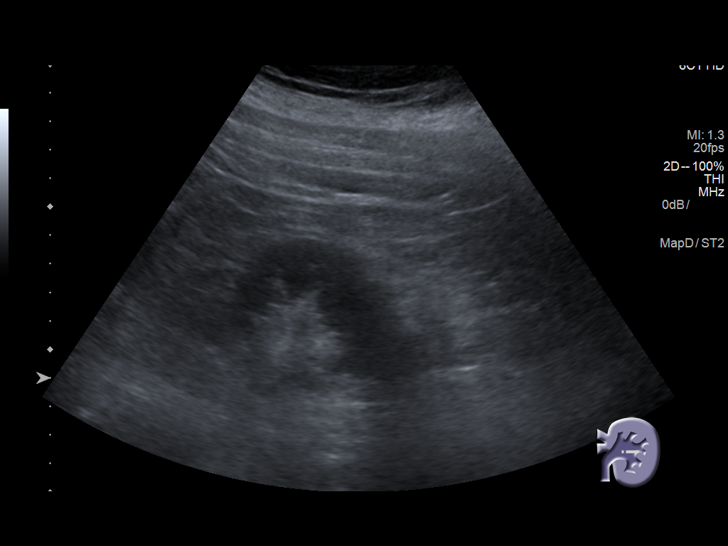
[im 45/49]
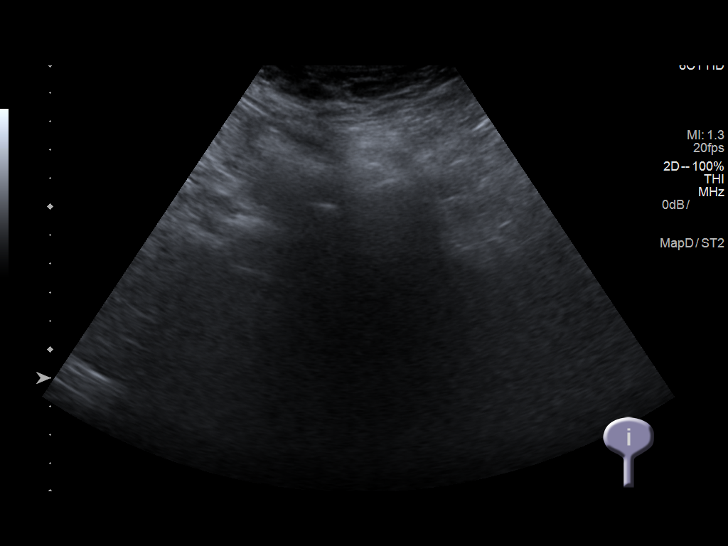
[im 49/49]
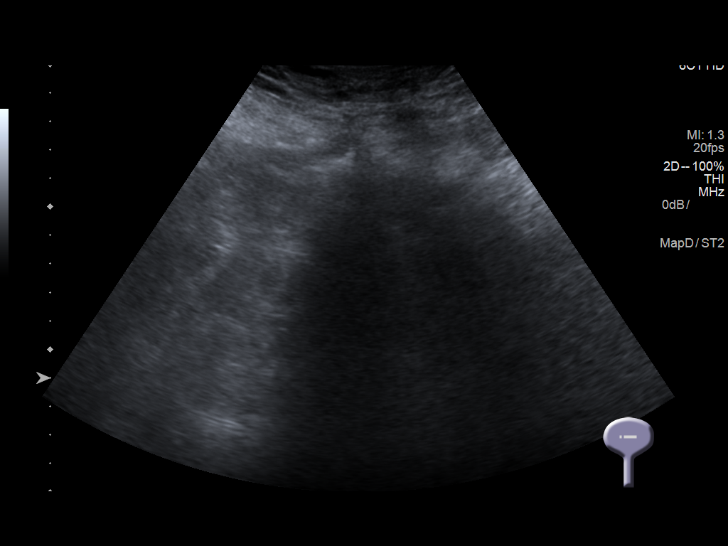

[14 of 25 positions shown; findings below may reference images not displayed]

FINDINGS: Right Kidney:

Length: 11.3 cm. Echogenicity within normal limits. No mass or
hydronephrosis visualized.

Left Kidney:

Length: 11.8 cm. Echogenicity within normal limits. No mass or
hydronephrosis visualized.

Bladder:

Decompressed and not adequately assessed
IMPRESSION: Kidneys unremarkable

Bladder decompressed and not assessed

## 2018-12-29 DIAGNOSIS — E782 Mixed hyperlipidemia: Secondary | ICD-10-CM | POA: Diagnosis not present

## 2018-12-29 DIAGNOSIS — I10 Essential (primary) hypertension: Secondary | ICD-10-CM | POA: Diagnosis not present

## 2018-12-29 DIAGNOSIS — E7800 Pure hypercholesterolemia, unspecified: Secondary | ICD-10-CM | POA: Diagnosis not present

## 2018-12-29 DIAGNOSIS — E1140 Type 2 diabetes mellitus with diabetic neuropathy, unspecified: Secondary | ICD-10-CM | POA: Diagnosis not present

## 2018-12-29 DIAGNOSIS — K219 Gastro-esophageal reflux disease without esophagitis: Secondary | ICD-10-CM | POA: Diagnosis not present

## 2019-01-03 DIAGNOSIS — Z0001 Encounter for general adult medical examination with abnormal findings: Secondary | ICD-10-CM | POA: Diagnosis not present

## 2019-01-03 DIAGNOSIS — Z6837 Body mass index (BMI) 37.0-37.9, adult: Secondary | ICD-10-CM | POA: Diagnosis not present

## 2019-01-03 DIAGNOSIS — I10 Essential (primary) hypertension: Secondary | ICD-10-CM | POA: Diagnosis not present

## 2019-01-18 ENCOUNTER — Ambulatory Visit (INDEPENDENT_AMBULATORY_CARE_PROVIDER_SITE_OTHER): Payer: Medicare Other | Admitting: Urology

## 2019-01-18 DIAGNOSIS — N3000 Acute cystitis without hematuria: Secondary | ICD-10-CM | POA: Diagnosis not present

## 2019-01-18 DIAGNOSIS — R338 Other retention of urine: Secondary | ICD-10-CM

## 2019-05-03 DIAGNOSIS — E1140 Type 2 diabetes mellitus with diabetic neuropathy, unspecified: Secondary | ICD-10-CM | POA: Diagnosis not present

## 2019-05-08 DIAGNOSIS — I10 Essential (primary) hypertension: Secondary | ICD-10-CM | POA: Diagnosis not present

## 2019-05-08 DIAGNOSIS — M545 Low back pain: Secondary | ICD-10-CM | POA: Diagnosis not present

## 2019-05-08 DIAGNOSIS — K219 Gastro-esophageal reflux disease without esophagitis: Secondary | ICD-10-CM | POA: Diagnosis not present

## 2019-05-08 DIAGNOSIS — E1140 Type 2 diabetes mellitus with diabetic neuropathy, unspecified: Secondary | ICD-10-CM | POA: Diagnosis not present

## 2019-08-02 ENCOUNTER — Ambulatory Visit (INDEPENDENT_AMBULATORY_CARE_PROVIDER_SITE_OTHER): Payer: Medicare Other | Admitting: Urology

## 2019-08-02 ENCOUNTER — Other Ambulatory Visit: Payer: Self-pay

## 2019-08-02 DIAGNOSIS — N3000 Acute cystitis without hematuria: Secondary | ICD-10-CM | POA: Diagnosis not present

## 2019-08-02 DIAGNOSIS — R338 Other retention of urine: Secondary | ICD-10-CM | POA: Diagnosis not present

## 2019-10-16 DIAGNOSIS — Z23 Encounter for immunization: Secondary | ICD-10-CM | POA: Diagnosis not present

## 2019-10-20 DIAGNOSIS — I10 Essential (primary) hypertension: Secondary | ICD-10-CM | POA: Diagnosis not present

## 2019-10-20 DIAGNOSIS — E1140 Type 2 diabetes mellitus with diabetic neuropathy, unspecified: Secondary | ICD-10-CM | POA: Diagnosis not present

## 2020-01-08 ENCOUNTER — Encounter: Payer: Self-pay | Admitting: Urology

## 2020-01-10 DIAGNOSIS — M48061 Spinal stenosis, lumbar region without neurogenic claudication: Secondary | ICD-10-CM | POA: Diagnosis not present

## 2020-01-10 DIAGNOSIS — M4310 Spondylolisthesis, site unspecified: Secondary | ICD-10-CM | POA: Insufficient documentation

## 2020-01-11 DIAGNOSIS — E782 Mixed hyperlipidemia: Secondary | ICD-10-CM | POA: Diagnosis not present

## 2020-01-11 DIAGNOSIS — I10 Essential (primary) hypertension: Secondary | ICD-10-CM | POA: Diagnosis not present

## 2020-01-11 DIAGNOSIS — Z23 Encounter for immunization: Secondary | ICD-10-CM | POA: Diagnosis not present

## 2020-01-11 DIAGNOSIS — E1140 Type 2 diabetes mellitus with diabetic neuropathy, unspecified: Secondary | ICD-10-CM | POA: Diagnosis not present

## 2020-01-15 DIAGNOSIS — E782 Mixed hyperlipidemia: Secondary | ICD-10-CM | POA: Diagnosis not present

## 2020-01-15 DIAGNOSIS — Z6838 Body mass index (BMI) 38.0-38.9, adult: Secondary | ICD-10-CM | POA: Diagnosis not present

## 2020-01-15 DIAGNOSIS — I10 Essential (primary) hypertension: Secondary | ICD-10-CM | POA: Diagnosis not present

## 2020-01-15 DIAGNOSIS — K219 Gastro-esophageal reflux disease without esophagitis: Secondary | ICD-10-CM | POA: Diagnosis not present

## 2020-01-15 DIAGNOSIS — Z0001 Encounter for general adult medical examination with abnormal findings: Secondary | ICD-10-CM | POA: Diagnosis not present

## 2020-01-15 DIAGNOSIS — E1140 Type 2 diabetes mellitus with diabetic neuropathy, unspecified: Secondary | ICD-10-CM | POA: Diagnosis not present

## 2020-01-15 DIAGNOSIS — M545 Low back pain: Secondary | ICD-10-CM | POA: Diagnosis not present

## 2020-02-07 DIAGNOSIS — M4310 Spondylolisthesis, site unspecified: Secondary | ICD-10-CM | POA: Diagnosis not present

## 2020-02-16 DIAGNOSIS — Z23 Encounter for immunization: Secondary | ICD-10-CM | POA: Diagnosis not present

## 2020-02-16 DIAGNOSIS — E7849 Other hyperlipidemia: Secondary | ICD-10-CM | POA: Diagnosis not present

## 2020-02-16 DIAGNOSIS — I10 Essential (primary) hypertension: Secondary | ICD-10-CM | POA: Diagnosis not present

## 2020-03-06 ENCOUNTER — Other Ambulatory Visit: Payer: Self-pay

## 2020-03-06 ENCOUNTER — Encounter: Payer: Self-pay | Admitting: Urology

## 2020-03-06 ENCOUNTER — Ambulatory Visit (INDEPENDENT_AMBULATORY_CARE_PROVIDER_SITE_OTHER): Payer: Medicare Other | Admitting: Urology

## 2020-03-06 VITALS — BP 119/77 | HR 68 | Temp 97.5°F | Ht 68.5 in | Wt 255.0 lb

## 2020-03-06 DIAGNOSIS — N3021 Other chronic cystitis with hematuria: Secondary | ICD-10-CM | POA: Insufficient documentation

## 2020-03-06 DIAGNOSIS — R339 Retention of urine, unspecified: Secondary | ICD-10-CM | POA: Insufficient documentation

## 2020-03-06 NOTE — Patient Instructions (Signed)
Acute Urinary Retention, Male ° °Acute urinary retention means that you cannot pee (urinate) at all, or that you pee too little and your bladder is not emptied completely. If it is not treated, it can lead to kidney damage or other serious problems. °Follow these instructions at home: °· Take over-the-counter and prescription medicines only as told by your doctor. Ask your doctor what medicines you should stay away from. Do not take any medicine unless your doctor says it is okay to do so. °· If you were sent home with a tube that drains the bladder (catheter), take care of it as told by your doctor. °· Drink enough fluid to keep your pee clear or pale yellow. °· If you were given an antibiotic, take it as told by your doctor. Do not stop taking the antibiotic even if you start to feel better. °· Do not use any products that contain nicotine or tobacco, such as cigarettes and e-cigarettes. If you need help quitting, ask your doctor. °· Watch for changes in your symptoms. Tell your doctor about them. °· If told, track changes in your blood pressure at home. Tell your doctor about them. °· Keep all follow-up visits as told by your doctor. This is important. °Contact a doctor if: °· You have spasms or you leak pee when you have spasms. °Get help right away if: °· You have chills or a fever. °· You have a tube that drains the bladder and: °? The tube stops draining pee. °? The tube falls out. °· You have blood in your pee. °Summary °· Acute urinary retention means that you have problems peeing. It may mean that you cannot pee at all, or that you pee too little. °· If this condition is not treated, it can lead to kidney damage or other serious problems. °· If you were sent home with a tube that drains the bladder, take care of it as told by your doctor. °· Monitor any changes in your symptoms. Tell your doctor about any changes. °This information is not intended to replace advice given to you by your health care  provider. Make sure you discuss any questions you have with your health care provider. °Document Revised: 02/23/2019 Document Reviewed: 01/08/2017 °Elsevier Patient Education © 2020 [employer] ° °

## 2020-03-06 NOTE — Progress Notes (Signed)

## 2020-03-06 NOTE — Progress Notes (Signed)
03/06/2020 3:15 PM   Marc Burns 1946/05/05 [id number]  Referring provider: Rory Percy, MD [place],  [place] [id number]  Urinary retention  HPI: Marc Burns is a [id number] here for followup for recurrent UTI and urinary retention with chronic indwelling foley.He has an indwelling foley which is changed every 2 weeks due to sediment. He flushes the foley with water daily. He is on self start cipro for UTIs. No UTIs since last visit.   His records from [facility] are as follows: 03/31/2017: He has a hx of [place] after RRP managed with indwelling foley. He has a large amount a sediment and he changes the foley weekly. He takes oxybutynin for bladder spasms. He flushes his foley weekly. ??07/28/2017: He has changed his foley 3 times since his last visit. No issues with changing the foley. ??October 2018: Pt has continued to change his catheter almost weekly since last office visit. This is precipitated by either increasing bladder pain and spasms for sediment buildup. He's not had to use antibiotics since August. He is concerned about the increase in frequency of pain more so than sedimentation today. Denies fevers. Denies gross hematuria. He last changed his catheter approximately 4 days ago.  01/05/2018: he has been chaning his foley every 2 weeks. He flushes the foley every 100-125cc prn. He tried acetic acid flushes which was extremely painful. 07/20/2018: He does normal saline and tap water to flush his foley which works well for preventing sediment. He changes his foley every 2-3 weeks. He is considering bladder neck incision for his bladder neck contracture  01/18/2019: He has to change his foley every 1-2 weeks due to sediment. He uses water. 08/02/2019: He has a chronic indwelling foley. She changes it every 1-2 weeks due to sediment. He uses water and we have tried acetic acid which caused severe burning  03/31/2017: He gets 5-6 UTIs per year and self treats with cipro. He has an indwelling foley for  which he changes every 1-2 weeks.   07/28/2017: He had 1 UTI since last visit. He self treated with cipro. He was flushing his foley daily which did improve the sediment.   01/05/2018: He has 1 UTI since his last visit. He developed dysuria and treated with cipro and dysuria resolved   07/20/2018: 1 UTI since last visit. He took cipro at that time. He gets worsening bladder spasms which accompanies the UTI   01/18/2019: He has had 2 UTIs since last visit which was treated with cipro   08/02/2019: He treated a UTI with cipro once since last visit    PMH: Past Medical History:  Diagnosis Date  . Arthritis   . Bladder neck contracture   . GERD (gastroesophageal reflux disease)   . History of gastric ulcer    2007  . History of kidney stones   . History of prostate cancer    2004--  S/P PROSTATECTOMY  . Hypertension   . Urinary incontinence   . Wears glasses     Surgical History: Past Surgical History:  Procedure Laterality Date  . BALLOON DILATION N/A 09/12/2013   Procedure: CYSTO BALLOON DILATION/RETROGRADE URETHROGRAM;  Surgeon: Reece Packer, MD;  Location: [facility];  Service: Urology;  Laterality: N/A;  . CYSTO/  URETHRAL DILATION/ BLADDER BX/ BILATERAL RETROGRADE PYELOGRAM  03-03-2010  . CYSTO/ BALLOON DILATION AND VAPORIZATION BLADDER NECK & PROSTATIC FOSSA  04-28-2010  . CYSTO/ BALLOON DILATION BLADDER NECK CONTRACTURE  07-23-2011;   11-17-2010;  08-08-2010  .  PROSTATECTOMY  2004   RADICAL  . TONSILLECTOMY  AS CHILD  . TRANSURETHRAL INCISION AND RESECTION OF BLADDER NECK CONTRACTURE  09-01-2010  . UMBILICAL HERNIA REPAIR      Home Medications:  Allergies as of 03/06/2020      Reactions   Sulfa Antibiotics Itching, Rash      Medication List       Accurate as of March 06, 2020  3:15 PM. If you have any questions, ask your nurse or doctor.        amLODipine-benazepril 5-20 MG capsule Commonly known as: LOTREL Take 1 capsule by mouth every  morning.   CENTRUM SILVER ADULT 50+ PO Take by mouth.   ciprofloxacin 250 MG tablet Commonly known as: Cipro Take 1 tablet (250 mg total) by mouth 2 (two) times daily.   HYDROcodone-acetaminophen 5-325 MG tablet Commonly known as: Norco Take 1 tablet by mouth every 6 (six) hours as needed for pain.   metoprolol tartrate 25 MG tablet Commonly known as: LOPRESSOR Take 25 mg by mouth 2 (two) times daily.   omeprazole 20 MG capsule Commonly known as: PRILOSEC Take 20 mg by mouth every morning.   rosuvastatin 5 MG tablet Commonly known as: CRESTOR Take 5 mg by mouth daily.       Allergies:  Allergies  Allergen Reactions  . Sulfa Antibiotics Itching and Rash    Family History: No family history on file.  Social History:  reports that he has never smoked. He has never used smokeless tobacco. He reports current alcohol use of about 6.0 standard drinks of alcohol per week. He reports that he does not use drugs.  ROS: All other review of systems were reviewed and are negative except what is noted above in HPI  Physical Exam: BP 119/77   Pulse 68   Temp (!) 97.5 F (36.4 C)   Ht 5' 8.5" (1.74 m)   Wt 255 lb (115.7 kg)   BMI 38.21 kg/m   Constitutional:  Alert and oriented, No acute distress. HEENT: [place] AT, moist mucus membranes.  Trachea midline, no masses. Cardiovascular: No clubbing, cyanosis, or edema. Respiratory: Normal respiratory effort, no increased work of breathing. GI: Abdomen is soft, nontender, nondistended, no abdominal masses GU: No CVA tenderness Lymph: No cervical or inguinal lymphadenopathy. Skin: No rashes, bruises or suspicious lesions. Neurologic: Grossly intact, no focal deficits, moving all 4 extremities. Psychiatric: Normal mood and affect.  Laboratory Data: Lab Results  Component Value Date   WBC 9.4 11/17/2010   HGB 15.0 09/12/2013   HCT 44.0 09/12/2013   MCV 87.8 11/17/2010   PLT 138 (L) 11/17/2010    Lab Results  Component Value  Date   CREATININE 0.84 11/17/2010    No results found for: PSA  No results found for: TESTOSTERONE  No results found for: HGBA1C  Urinalysis    Component Value Date/Time   COLORURINE YELLOW 02/26/2010 0930   APPEARANCEUR CLOUDY (A) 02/26/2010 0930   LABSPEC 1.019 02/26/2010 0930   PHURINE 5.5 02/26/2010 0930   GLUCOSEU NEGATIVE 02/26/2010 0930   HGBUR LARGE (A) 02/26/2010 0930   BILIRUBINUR NEGATIVE 02/26/2010 0930   KETONESUR NEGATIVE 02/26/2010 0930   PROTEINUR 30 (A) 02/26/2010 0930   UROBILINOGEN 0.2 02/26/2010 0930   NITRITE NEGATIVE 02/26/2010 0930   LEUKOCYTESUR SMALL (A) 02/26/2010 0930    Lab Results  Component Value Date   BACTERIA FEW (A) 02/26/2010    Pertinent Imaging:  No results found for this or any previous visit. No  results found for this or any previous visit. No results found for this or any previous visit. No results found for this or any previous visit. Results for orders placed during the hospital encounter of 07/20/17  US Renal   Narrative CLINICAL DATA:  74 year old male with urinary retention. Self catheterization. Initial encounter.  EXAM: RENAL / URINARY TRACT ULTRASOUND COMPLETE  COMPARISON:  None.  FINDINGS: Right Kidney:  Length: 11.3 cm. Echogenicity within normal limits. No mass or hydronephrosis visualized.  Left Kidney:  Length: 11.8 cm. Echogenicity within normal limits. No mass or hydronephrosis visualized.  Bladder:  Decompressed and not adequately assessed  IMPRESSION: Kidneys unremarkable  Bladder decompressed and not assessed   Electronically Signed   By: Genia Del M.D.   On: 07/20/2017 12:25    No results found for this or any previous visit. No results found for this or any previous visit. No results found for this or any previous visit.  Assessment & Plan:    1. Urinary retention Continue indwelling foley -water flush every day  2. Chronic cystitis with hematuria cipro self start  prn -RTC 6 months   No follow-ups on file.  Nicolette Bang, MD  [facility] [place]

## 2020-03-28 DIAGNOSIS — M4310 Spondylolisthesis, site unspecified: Secondary | ICD-10-CM | POA: Diagnosis not present

## 2020-03-28 DIAGNOSIS — Z6838 Body mass index (BMI) 38.0-38.9, adult: Secondary | ICD-10-CM | POA: Diagnosis not present

## 2020-03-28 DIAGNOSIS — I10 Essential (primary) hypertension: Secondary | ICD-10-CM | POA: Diagnosis not present

## 2020-05-06 DIAGNOSIS — Z6837 Body mass index (BMI) 37.0-37.9, adult: Secondary | ICD-10-CM | POA: Diagnosis not present

## 2020-05-06 DIAGNOSIS — E1140 Type 2 diabetes mellitus with diabetic neuropathy, unspecified: Secondary | ICD-10-CM | POA: Diagnosis not present

## 2020-05-20 DIAGNOSIS — E7849 Other hyperlipidemia: Secondary | ICD-10-CM | POA: Diagnosis not present

## 2020-05-20 DIAGNOSIS — Z7984 Long term (current) use of oral hypoglycemic drugs: Secondary | ICD-10-CM | POA: Diagnosis not present

## 2020-05-20 DIAGNOSIS — E1140 Type 2 diabetes mellitus with diabetic neuropathy, unspecified: Secondary | ICD-10-CM | POA: Diagnosis not present

## 2020-05-20 DIAGNOSIS — I10 Essential (primary) hypertension: Secondary | ICD-10-CM | POA: Diagnosis not present

## 2020-05-23 DIAGNOSIS — M4310 Spondylolisthesis, site unspecified: Secondary | ICD-10-CM | POA: Diagnosis not present

## 2020-05-27 DIAGNOSIS — M48061 Spinal stenosis, lumbar region without neurogenic claudication: Secondary | ICD-10-CM | POA: Diagnosis not present

## 2020-05-27 DIAGNOSIS — M5136 Other intervertebral disc degeneration, lumbar region: Secondary | ICD-10-CM | POA: Diagnosis not present

## 2020-05-27 DIAGNOSIS — M5126 Other intervertebral disc displacement, lumbar region: Secondary | ICD-10-CM | POA: Diagnosis not present

## 2020-05-27 DIAGNOSIS — M4310 Spondylolisthesis, site unspecified: Secondary | ICD-10-CM | POA: Diagnosis not present

## 2020-05-30 DIAGNOSIS — M4310 Spondylolisthesis, site unspecified: Secondary | ICD-10-CM | POA: Diagnosis not present

## 2020-06-18 DIAGNOSIS — M545 Low back pain: Secondary | ICD-10-CM | POA: Diagnosis not present

## 2020-06-18 DIAGNOSIS — M4310 Spondylolisthesis, site unspecified: Secondary | ICD-10-CM | POA: Diagnosis not present

## 2020-06-19 DIAGNOSIS — E7849 Other hyperlipidemia: Secondary | ICD-10-CM | POA: Diagnosis not present

## 2020-06-19 DIAGNOSIS — Z7984 Long term (current) use of oral hypoglycemic drugs: Secondary | ICD-10-CM | POA: Diagnosis not present

## 2020-06-19 DIAGNOSIS — E1140 Type 2 diabetes mellitus with diabetic neuropathy, unspecified: Secondary | ICD-10-CM | POA: Diagnosis not present

## 2020-06-19 DIAGNOSIS — I10 Essential (primary) hypertension: Secondary | ICD-10-CM | POA: Diagnosis not present

## 2020-07-11 DIAGNOSIS — M545 Low back pain: Secondary | ICD-10-CM | POA: Diagnosis not present

## 2020-07-19 DIAGNOSIS — Z7984 Long term (current) use of oral hypoglycemic drugs: Secondary | ICD-10-CM | POA: Diagnosis not present

## 2020-07-19 DIAGNOSIS — I10 Essential (primary) hypertension: Secondary | ICD-10-CM | POA: Diagnosis not present

## 2020-07-19 DIAGNOSIS — E7849 Other hyperlipidemia: Secondary | ICD-10-CM | POA: Diagnosis not present

## 2020-07-19 DIAGNOSIS — E1140 Type 2 diabetes mellitus with diabetic neuropathy, unspecified: Secondary | ICD-10-CM | POA: Diagnosis not present

## 2020-07-30 DIAGNOSIS — M4310 Spondylolisthesis, site unspecified: Secondary | ICD-10-CM | POA: Diagnosis not present

## 2020-08-19 ENCOUNTER — Other Ambulatory Visit: Payer: Self-pay | Admitting: Urology

## 2020-08-20 DIAGNOSIS — Z7984 Long term (current) use of oral hypoglycemic drugs: Secondary | ICD-10-CM | POA: Diagnosis not present

## 2020-08-20 DIAGNOSIS — I10 Essential (primary) hypertension: Secondary | ICD-10-CM | POA: Diagnosis not present

## 2020-08-20 DIAGNOSIS — E1140 Type 2 diabetes mellitus with diabetic neuropathy, unspecified: Secondary | ICD-10-CM | POA: Diagnosis not present

## 2020-08-20 DIAGNOSIS — E7849 Other hyperlipidemia: Secondary | ICD-10-CM | POA: Diagnosis not present

## 2020-09-11 ENCOUNTER — Encounter: Payer: Self-pay | Admitting: Urology

## 2020-09-11 ENCOUNTER — Ambulatory Visit (INDEPENDENT_AMBULATORY_CARE_PROVIDER_SITE_OTHER): Payer: Medicare Other | Admitting: Urology

## 2020-09-11 ENCOUNTER — Other Ambulatory Visit: Payer: Self-pay

## 2020-09-11 ENCOUNTER — Ambulatory Visit: Payer: Medicare Other | Admitting: Urology

## 2020-09-11 VITALS — BP 146/72 | HR 65 | Temp 97.9°F | Ht 68.5 in | Wt 255.0 lb

## 2020-09-11 DIAGNOSIS — N3021 Other chronic cystitis with hematuria: Secondary | ICD-10-CM

## 2020-09-11 DIAGNOSIS — R339 Retention of urine, unspecified: Secondary | ICD-10-CM | POA: Diagnosis not present

## 2020-09-11 DIAGNOSIS — M4310 Spondylolisthesis, site unspecified: Secondary | ICD-10-CM | POA: Diagnosis not present

## 2020-09-11 MED ORDER — CIPROFLOXACIN HCL 250 MG PO TABS: 250.0000 mg | ORAL_TABLET | Freq: Two times a day (BID) | ORAL | 5 refills | Status: DC

## 2020-09-11 NOTE — Patient Instructions (Signed)
Urinary Tract Infection, Adult A urinary tract infection (UTI) is an infection of any part of the urinary tract. The urinary tract includes:  The kidneys.  The ureters.  The bladder.  The urethra. These organs make, store, and get rid of pee (urine) in the body. What are the causes? This is caused by germs (bacteria) in your genital area. These germs grow and cause swelling (inflammation) of your urinary tract. What increases the risk? You are more likely to develop this condition if:  You have a small, thin tube (catheter) to drain pee.  You cannot control when you pee or poop (incontinence).  You are male, and: ? You use these methods to prevent pregnancy:  A medicine that kills sperm (spermicide).  A device that blocks sperm (diaphragm). ? You have low levels of a male hormone (estrogen). ? You are pregnant.  You have genes that add to your risk.  You are sexually active.  You take antibiotic medicines.  You have trouble peeing because of: ? A prostate that is bigger than normal, if you are male. ? A blockage in the part of your body that drains pee from the bladder (urethra). ? A kidney stone. ? A nerve condition that affects your bladder (neurogenic bladder). ? Not getting enough to drink. ? Not peeing often enough.  You have other conditions, such as: ? Diabetes. ? A weak disease-fighting system (immune system). ? Sickle cell disease. ? Gout. ? Injury of the spine. What are the signs or symptoms? Symptoms of this condition include:  Needing to pee right away (urgently).  Peeing often.  Peeing small amounts often.  Pain or burning when peeing.  Blood in the pee.  Pee that smells bad or not like normal.  Trouble peeing.  Pee that is cloudy.  Fluid coming from the vagina, if you are male.  Pain in the belly or lower back. Other symptoms include:  Throwing up (vomiting).  No urge to eat.  Feeling mixed up (confused).  Being tired  and grouchy (irritable).  A fever.  Watery poop (diarrhea). How is this treated? This condition may be treated with:  Antibiotic medicine.  Other medicines.  Drinking enough water. Follow these instructions at home:  Medicines  Take over-the-counter and prescription medicines only as told by your doctor.  If you were prescribed an antibiotic medicine, take it as told by your doctor. Do not stop taking it even if you start to feel better. General instructions  Make sure you: ? Pee until your bladder is empty. ? Do not hold pee for a long time. ? Empty your bladder after sex. ? Wipe from front to back after pooping if you are a male. Use each tissue one time when you wipe.  Drink enough fluid to keep your pee pale yellow.  Keep all follow-up visits as told by your doctor. This is important. Contact a doctor if:  You do not get better after 1-2 days.  Your symptoms go away and then come back. Get help right away if:  You have very bad back pain.  You have very bad pain in your lower belly.  You have a fever.  You are sick to your stomach (nauseous).  You are throwing up. Summary  A urinary tract infection (UTI) is an infection of any part of the urinary tract.  This condition is caused by germs in your genital area.  There are many risk factors for a UTI. These include having a small, thin   tube to drain pee and not being able to control when you pee or poop.  Treatment includes antibiotic medicines for germs.  Drink enough fluid to keep your pee pale yellow. This information is not intended to replace advice given to you by your health care provider. Make sure you discuss any questions you have with your health care provider. Document Revised: 11/24/2018 Document Reviewed: 06/16/2018 Elsevier Patient Education  2020 [employer]vier Inc[employer]  

## 2020-09-11 NOTE — Progress Notes (Signed)
Urological Symptom Review  Patient is experiencing the following symptoms: Urinary tract infection   Review of Systems  Gastrointestinal (upper)  : Negative for upper GI symptoms  Gastrointestinal (lower) : Negative for lower GI symptoms  Constitutional : Negative for symptoms  Skin: Negative for skin symptoms  Eyes: Negative for eye symptoms  Ear/Nose/Throat : Negative for Ear/Nose/Throat symptoms  Hematologic/Lymphatic: Negative for Hematologic/Lymphatic symptoms  Cardiovascular : Leg swelling  Respiratory : Negative for respiratory symptoms  Endocrine: Negative for endocrine symptoms  Musculoskeletal: Negative for musculoskeletal symptoms  Neurological: Negative for neurological symptoms  Psychologic: Negative for psychiatric symptoms

## 2020-09-11 NOTE — Progress Notes (Signed)
09/11/2020 1:50 PM   Marc Burns 03-Jan-1946 [id number]  Referring provider: Rory Percy, MD [place],  [place] [id number]  Recurrent UTI  HPI: Marc Burns is a 74yo here for followup for urinary retention and recurrent UTI. He changes the foley every 2 weeks due to sediment. He has treated himself twice since last visit with cipro. No dysuria or hematuria.   PMH: Past Medical History:  Diagnosis Date  . Arthritis   . Bladder neck contracture   . GERD (gastroesophageal reflux disease)   . History of gastric ulcer    2007  . History of kidney stones   . History of prostate cancer    2004--  S/P PROSTATECTOMY  . Hypertension   . Urinary incontinence   . Wears glasses     Surgical History: Past Surgical History:  Procedure Laterality Date  . BALLOON DILATION N/A 09/12/2013   Procedure: CYSTO BALLOON DILATION/RETROGRADE URETHROGRAM;  Surgeon: Reece Packer, MD;  [facility];  Service: Urology;  Laterality: N/A;  . CYSTO/  URETHRAL DILATION/ BLADDER BX/ BILATERAL RETROGRADE PYELOGRAM  03-03-2010  . CYSTO/ BALLOON DILATION AND VAPORIZATION BLADDER NECK & PROSTATIC FOSSA  04-28-2010  . CYSTO/ BALLOON DILATION BLADDER NECK CONTRACTURE  07-23-2011;   11-17-2010;  08-08-2010  . PROSTATECTOMY  2004   RADICAL  . TONSILLECTOMY  AS CHILD  . TRANSURETHRAL INCISION AND RESECTION OF BLADDER NECK CONTRACTURE  09-01-2010  . UMBILICAL HERNIA REPAIR      Home Medications:  Allergies as of 09/11/2020      Reactions   Sulfa Antibiotics Itching, Rash      Medication List       Accurate as of September 11, 2020  1:50 PM. If you have any questions, ask your nurse or doctor.        amLODipine-benazepril 5-20 MG capsule Commonly known as: LOTREL Take 1 capsule by mouth every morning.   CENTRUM SILVER ADULT 50+ PO Take by mouth.   ciprofloxacin 250 MG tablet Commonly known as: CIPRO Take 1 tablet by mouth twice daily   furosemide 20 MG  tablet Commonly known as: LASIX   HYDROcodone-acetaminophen 5-325 MG tablet Commonly known as: Norco Take 1 tablet by mouth every 6 (six) hours as needed for pain.   Jardiance 10 MG Tabs tablet Generic drug: empagliflozin   metoprolol tartrate 25 MG tablet Commonly known as: LOPRESSOR Take 25 mg by mouth 2 (two) times daily.   omeprazole 20 MG capsule Commonly known as: PRILOSEC Take 20 mg by mouth every morning.   oxybutynin 10 MG 24 hr tablet Commonly known as: DITROPAN-XL   Ozempic (0.25 or 0.5 MG/DOSE) 2 MG/1.5ML Sopn Generic drug: Semaglutide(0.25 or 0.5MG /DOS)   rosuvastatin 5 MG tablet Commonly known as: CRESTOR Take 5 mg by mouth daily.   rosuvastatin 10 MG tablet Commonly known as: CRESTOR       Allergies:  Allergies  Allergen Reactions  . Sulfa Antibiotics Itching and Rash    Family History: No family history on file.  Social History:  reports that he has never smoked. He has never used smokeless tobacco. He reports current alcohol use of about 6.0 standard drinks of alcohol per week. He reports that he does not use drugs.  ROS: All other review of systems were reviewed and are negative except what is noted above in HPI  Physical Exam: BP (!) 146/72   Pulse 65   Temp 97.9 F (36.6 C)   Ht 5'  8.5" (1.74 m)   Wt 255 lb (115.7 kg)   BMI 38.21 kg/m   Constitutional:  Alert and oriented, No acute distress. HEENT: [place] AT, moist mucus membranes.  Trachea midline, no masses. Cardiovascular: No clubbing, cyanosis, or edema. Respiratory: Normal respiratory effort, no increased work of breathing. GI: Abdomen is soft, nontender, nondistended, no abdominal masses GU: No CVA tenderness.  Lymph: No cervical or inguinal lymphadenopathy. Skin: No rashes, bruises or suspicious lesions. Neurologic: Grossly intact, no focal deficits, moving all 4 extremities. Psychiatric: Normal mood and affect.  Laboratory Data: Lab Results  Component Value Date   WBC 9.4  11/17/2010   HGB 15.0 09/12/2013   HCT 44.0 09/12/2013   MCV 87.8 11/17/2010   PLT 138 (L) 11/17/2010    Lab Results  Component Value Date   CREATININE 0.84 11/17/2010    No results found for: PSA  No results found for: TESTOSTERONE  No results found for: HGBA1C  Urinalysis    Component Value Date/Time   COLORURINE YELLOW 02/26/2010 0930   APPEARANCEUR CLOUDY (A) 02/26/2010 0930   LABSPEC 1.019 02/26/2010 0930   PHURINE 5.5 02/26/2010 0930   GLUCOSEU NEGATIVE 02/26/2010 0930   HGBUR LARGE (A) 02/26/2010 0930   BILIRUBINUR NEGATIVE 02/26/2010 0930   KETONESUR NEGATIVE 02/26/2010 0930   PROTEINUR 30 (A) 02/26/2010 0930   UROBILINOGEN 0.2 02/26/2010 0930   NITRITE NEGATIVE 02/26/2010 0930   LEUKOCYTESUR SMALL (A) 02/26/2010 0930    Lab Results  Component Value Date   BACTERIA FEW (A) 02/26/2010    Pertinent Imaging:  No results found for this or any previous visit.  No results found for this or any previous visit.  No results found for this or any previous visit.  No results found for this or any previous visit.  Results for orders placed during the hospital encounter of 07/20/17  US Renal  Narrative CLINICAL DATA:  74 year old male with urinary retention. Self catheterization. Initial encounter.  EXAM: RENAL / URINARY TRACT ULTRASOUND COMPLETE  COMPARISON:  None.  FINDINGS: Right Kidney:  Length: 11.3 cm. Echogenicity within normal limits. No mass or hydronephrosis visualized.  Left Kidney:  Length: 11.8 cm. Echogenicity within normal limits. No mass or hydronephrosis visualized.  Bladder:  Decompressed and not adequately assessed  IMPRESSION: Kidneys unremarkable  Bladder decompressed and not assessed   Electronically Signed By: Genia Del M.D. On: 07/20/2017 12:25  No results found for this or any previous visit.  No results found for this or any previous visit.  No results found for this or any previous  visit.   Assessment & Plan:    1. Urinary retention -continue indwelling foley with daily water flushes - Urinalysis, Routine w reflex microscopic  2. Chronic cystitis with hematuria Self start cipro   No follow-ups on file.  Nicolette Bang, MD  [facility] [place]

## 2020-09-19 DIAGNOSIS — E1140 Type 2 diabetes mellitus with diabetic neuropathy, unspecified: Secondary | ICD-10-CM | POA: Diagnosis not present

## 2020-09-19 DIAGNOSIS — E7849 Other hyperlipidemia: Secondary | ICD-10-CM | POA: Diagnosis not present

## 2020-09-19 DIAGNOSIS — Z7984 Long term (current) use of oral hypoglycemic drugs: Secondary | ICD-10-CM | POA: Diagnosis not present

## 2020-09-19 DIAGNOSIS — I10 Essential (primary) hypertension: Secondary | ICD-10-CM | POA: Diagnosis not present

## 2020-09-24 DIAGNOSIS — E7800 Pure hypercholesterolemia, unspecified: Secondary | ICD-10-CM | POA: Diagnosis not present

## 2020-09-24 DIAGNOSIS — Z23 Encounter for immunization: Secondary | ICD-10-CM | POA: Diagnosis not present

## 2020-09-24 DIAGNOSIS — E782 Mixed hyperlipidemia: Secondary | ICD-10-CM | POA: Diagnosis not present

## 2020-09-24 DIAGNOSIS — K219 Gastro-esophageal reflux disease without esophagitis: Secondary | ICD-10-CM | POA: Diagnosis not present

## 2020-09-24 DIAGNOSIS — E1140 Type 2 diabetes mellitus with diabetic neuropathy, unspecified: Secondary | ICD-10-CM | POA: Diagnosis not present

## 2020-09-24 DIAGNOSIS — I10 Essential (primary) hypertension: Secondary | ICD-10-CM | POA: Diagnosis not present

## 2020-09-27 DIAGNOSIS — Z6837 Body mass index (BMI) 37.0-37.9, adult: Secondary | ICD-10-CM | POA: Diagnosis not present

## 2020-09-27 DIAGNOSIS — I10 Essential (primary) hypertension: Secondary | ICD-10-CM | POA: Diagnosis not present

## 2020-10-19 DIAGNOSIS — I10 Essential (primary) hypertension: Secondary | ICD-10-CM | POA: Diagnosis not present

## 2020-10-19 DIAGNOSIS — Z7984 Long term (current) use of oral hypoglycemic drugs: Secondary | ICD-10-CM | POA: Diagnosis not present

## 2020-10-19 DIAGNOSIS — E7849 Other hyperlipidemia: Secondary | ICD-10-CM | POA: Diagnosis not present

## 2020-10-19 DIAGNOSIS — E1140 Type 2 diabetes mellitus with diabetic neuropathy, unspecified: Secondary | ICD-10-CM | POA: Diagnosis not present

## 2020-10-24 DIAGNOSIS — Z23 Encounter for immunization: Secondary | ICD-10-CM | POA: Diagnosis not present

## 2020-11-12 DIAGNOSIS — L02212 Cutaneous abscess of back [any part, except buttock]: Secondary | ICD-10-CM | POA: Diagnosis not present

## 2020-11-19 DIAGNOSIS — Z7984 Long term (current) use of oral hypoglycemic drugs: Secondary | ICD-10-CM | POA: Diagnosis not present

## 2020-11-19 DIAGNOSIS — E1140 Type 2 diabetes mellitus with diabetic neuropathy, unspecified: Secondary | ICD-10-CM | POA: Diagnosis not present

## 2020-11-19 DIAGNOSIS — I10 Essential (primary) hypertension: Secondary | ICD-10-CM | POA: Diagnosis not present

## 2020-11-28 DIAGNOSIS — C44619 Basal cell carcinoma of skin of left upper limb, including shoulder: Secondary | ICD-10-CM | POA: Diagnosis not present

## 2020-11-28 DIAGNOSIS — D485 Neoplasm of uncertain behavior of skin: Secondary | ICD-10-CM | POA: Diagnosis not present

## 2020-11-28 DIAGNOSIS — L570 Actinic keratosis: Secondary | ICD-10-CM | POA: Diagnosis not present

## 2020-11-28 DIAGNOSIS — L0291 Cutaneous abscess, unspecified: Secondary | ICD-10-CM | POA: Diagnosis not present

## 2020-12-25 DIAGNOSIS — Z6838 Body mass index (BMI) 38.0-38.9, adult: Secondary | ICD-10-CM | POA: Diagnosis not present

## 2020-12-25 DIAGNOSIS — I10 Essential (primary) hypertension: Secondary | ICD-10-CM | POA: Diagnosis not present

## 2020-12-25 DIAGNOSIS — M4310 Spondylolisthesis, site unspecified: Secondary | ICD-10-CM | POA: Diagnosis not present

## 2020-12-26 DIAGNOSIS — C44619 Basal cell carcinoma of skin of left upper limb, including shoulder: Secondary | ICD-10-CM | POA: Diagnosis not present

## 2020-12-26 DIAGNOSIS — L570 Actinic keratosis: Secondary | ICD-10-CM | POA: Diagnosis not present

## 2021-01-21 DIAGNOSIS — E7800 Pure hypercholesterolemia, unspecified: Secondary | ICD-10-CM | POA: Diagnosis not present

## 2021-01-21 DIAGNOSIS — I10 Essential (primary) hypertension: Secondary | ICD-10-CM | POA: Diagnosis not present

## 2021-01-21 DIAGNOSIS — K219 Gastro-esophageal reflux disease without esophagitis: Secondary | ICD-10-CM | POA: Diagnosis not present

## 2021-01-21 DIAGNOSIS — E1140 Type 2 diabetes mellitus with diabetic neuropathy, unspecified: Secondary | ICD-10-CM | POA: Diagnosis not present

## 2021-01-21 DIAGNOSIS — E7801 Familial hypercholesterolemia: Secondary | ICD-10-CM | POA: Diagnosis not present

## 2021-01-21 DIAGNOSIS — E7849 Other hyperlipidemia: Secondary | ICD-10-CM | POA: Diagnosis not present

## 2021-01-21 DIAGNOSIS — E782 Mixed hyperlipidemia: Secondary | ICD-10-CM | POA: Diagnosis not present

## 2021-01-27 DIAGNOSIS — E1165 Type 2 diabetes mellitus with hyperglycemia: Secondary | ICD-10-CM | POA: Diagnosis not present

## 2021-01-27 DIAGNOSIS — Z0001 Encounter for general adult medical examination with abnormal findings: Secondary | ICD-10-CM | POA: Diagnosis not present

## 2021-01-27 DIAGNOSIS — E7801 Familial hypercholesterolemia: Secondary | ICD-10-CM | POA: Diagnosis not present

## 2021-01-27 DIAGNOSIS — I10 Essential (primary) hypertension: Secondary | ICD-10-CM | POA: Diagnosis not present

## 2021-01-27 DIAGNOSIS — E1140 Type 2 diabetes mellitus with diabetic neuropathy, unspecified: Secondary | ICD-10-CM | POA: Diagnosis not present

## 2021-01-27 DIAGNOSIS — Z6841 Body Mass Index (BMI) 40.0 and over, adult: Secondary | ICD-10-CM | POA: Diagnosis not present

## 2021-02-17 DIAGNOSIS — I10 Essential (primary) hypertension: Secondary | ICD-10-CM | POA: Diagnosis not present

## 2021-02-17 DIAGNOSIS — E1140 Type 2 diabetes mellitus with diabetic neuropathy, unspecified: Secondary | ICD-10-CM | POA: Diagnosis not present

## 2021-02-17 DIAGNOSIS — Z7984 Long term (current) use of oral hypoglycemic drugs: Secondary | ICD-10-CM | POA: Diagnosis not present

## 2021-02-17 DIAGNOSIS — E7849 Other hyperlipidemia: Secondary | ICD-10-CM | POA: Diagnosis not present

## 2021-02-24 DIAGNOSIS — E1140 Type 2 diabetes mellitus with diabetic neuropathy, unspecified: Secondary | ICD-10-CM | POA: Diagnosis not present

## 2021-02-24 DIAGNOSIS — E1165 Type 2 diabetes mellitus with hyperglycemia: Secondary | ICD-10-CM | POA: Diagnosis not present

## 2021-02-24 DIAGNOSIS — E7801 Familial hypercholesterolemia: Secondary | ICD-10-CM | POA: Diagnosis not present

## 2021-02-24 DIAGNOSIS — Z6839 Body mass index (BMI) 39.0-39.9, adult: Secondary | ICD-10-CM | POA: Diagnosis not present

## 2021-02-24 DIAGNOSIS — E7849 Other hyperlipidemia: Secondary | ICD-10-CM | POA: Diagnosis not present

## 2021-02-24 DIAGNOSIS — I10 Essential (primary) hypertension: Secondary | ICD-10-CM | POA: Diagnosis not present

## 2021-02-24 DIAGNOSIS — G250 Essential tremor: Secondary | ICD-10-CM | POA: Diagnosis not present

## 2021-03-12 ENCOUNTER — Other Ambulatory Visit: Payer: Self-pay

## 2021-03-12 ENCOUNTER — Encounter: Payer: Self-pay | Admitting: Urology

## 2021-03-12 ENCOUNTER — Ambulatory Visit (INDEPENDENT_AMBULATORY_CARE_PROVIDER_SITE_OTHER): Payer: Medicare Other | Admitting: Urology

## 2021-03-12 VITALS — BP 91/65 | HR 74 | Temp 98.2°F | Ht 69.0 in | Wt 260.0 lb

## 2021-03-12 DIAGNOSIS — R339 Retention of urine, unspecified: Secondary | ICD-10-CM | POA: Diagnosis not present

## 2021-03-12 DIAGNOSIS — N3021 Other chronic cystitis with hematuria: Secondary | ICD-10-CM

## 2021-03-12 MED ORDER — CLOTRIMAZOLE-BETAMETHASONE 1-0.05 % EX CREA: 1.0000 "application " | TOPICAL_CREAM | Freq: Two times a day (BID) | CUTANEOUS | 1 refills | Status: DC

## 2021-03-12 NOTE — Progress Notes (Signed)
03/12/2021 3:28 PM   Marc Burns October 07, 1946 [id number]  Referring provider: Rory Percy, MD [place],  [place] [id number]  Followup urinary retention  HPI: Marc Burns is a 75yo here for followup for urinary retention and recurrent UTI. He has treated himself 3 times since last visit for a UTI. He gets less than 5 days for each indwelling foley due to sediment. He has a bladder neck contracture after RRP.    PMH: Past Medical History:  Diagnosis Date  . Arthritis   . Bladder neck contracture   . GERD (gastroesophageal reflux disease)   . History of gastric ulcer    2007  . History of kidney stones   . History of prostate cancer    2004--  S/P PROSTATECTOMY  . Hypertension   . Urinary incontinence   . Wears glasses     Surgical History: Past Surgical History:  Procedure Laterality Date  . BALLOON DILATION N/A 09/12/2013   Procedure: CYSTO BALLOON DILATION/RETROGRADE URETHROGRAM;  Surgeon: Reece Packer, MD;  Location: [facility];  Service: Urology;  Laterality: N/A;  . CYSTO/  URETHRAL DILATION/ BLADDER BX/ BILATERAL RETROGRADE PYELOGRAM  03-03-2010  . CYSTO/ BALLOON DILATION AND VAPORIZATION BLADDER NECK & PROSTATIC FOSSA  04-28-2010  . CYSTO/ BALLOON DILATION BLADDER NECK CONTRACTURE  07-23-2011;   11-17-2010;  08-08-2010  . PROSTATECTOMY  2004   RADICAL  . TONSILLECTOMY  AS CHILD  . TRANSURETHRAL INCISION AND RESECTION OF BLADDER NECK CONTRACTURE  09-01-2010  . UMBILICAL HERNIA REPAIR      Home Medications:  Allergies as of 03/12/2021      Reactions   Other    Sulfa Antibiotics Itching, Rash      Medication List       Accurate as of March 12, 2021  3:28 PM. If you have any questions, ask your nurse or doctor.        amLODipine-benazepril 5-20 MG capsule Commonly known as: LOTREL Take 1 capsule by mouth every morning.   CENTRUM SILVER ADULT 50+ PO Take by mouth.   ciprofloxacin 250 MG tablet Commonly known as:  CIPRO Take 1 tablet (250 mg total) by mouth 2 (two) times daily.   furosemide 20 MG tablet Commonly known as: LASIX   HYDROcodone-acetaminophen 5-325 MG tablet Commonly known as: Norco Take 1 tablet by mouth every 6 (six) hours as needed for pain.   Jardiance 10 MG Tabs tablet Generic drug: empagliflozin   metoprolol tartrate 25 MG tablet Commonly known as: LOPRESSOR Take 25 mg by mouth 2 (two) times daily.   omeprazole 20 MG capsule Commonly known as: PRILOSEC Take 20 mg by mouth every morning.   oxybutynin 10 MG 24 hr tablet Commonly known as: DITROPAN-XL   Ozempic (0.25 or 0.5 MG/DOSE) 2 MG/1.5ML Sopn Generic drug: Semaglutide(0.25 or 0.5MG /DOS)   rosuvastatin 5 MG tablet Commonly known as: CRESTOR Take 5 mg by mouth daily.   rosuvastatin 10 MG tablet Commonly known as: CRESTOR       Allergies:  Allergies  Allergen Reactions  . Other   . Sulfa Antibiotics Itching and Rash    Family History: No family history on file.  Social History:  reports that he has never smoked. He has never used smokeless tobacco. He reports current alcohol use of about 6.0 standard drinks of alcohol per week. He reports that he does not use drugs.  ROS: All other review of systems were reviewed and are negative except what is noted  above in HPI  Physical Exam: BP 91/65   Pulse 74   Temp 98.2 F (36.8 C)   Ht 5\' 9"  (1.753 m)   Wt 260 lb (117.9 kg)   BMI 38.40 kg/m   Constitutional:  Alert and oriented, No acute distress. HEENT: [place] AT, moist mucus membranes.  Trachea midline, no masses. Cardiovascular: No clubbing, cyanosis, or edema. Respiratory: Normal respiratory effort, no increased work of breathing. GI: Abdomen is soft, nontender, nondistended, no abdominal masses GU: No CVA tenderness.  Lymph: No cervical or inguinal lymphadenopathy. Skin: No rashes, bruises or suspicious lesions. Neurologic: Grossly intact, no focal deficits, moving all 4 extremities. Psychiatric:  Normal mood and affect.  Laboratory Data: Lab Results  Component Value Date   WBC 9.4 11/17/2010   HGB 15.0 09/12/2013   HCT 44.0 09/12/2013   MCV 87.8 11/17/2010   PLT 138 (L) 11/17/2010    Lab Results  Component Value Date   CREATININE 0.84 11/17/2010    No results found for: PSA  No results found for: TESTOSTERONE  No results found for: HGBA1C  Urinalysis    Component Value Date/Time   COLORURINE YELLOW 02/26/2010 0930   APPEARANCEUR CLOUDY (A) 02/26/2010 0930   LABSPEC 1.019 02/26/2010 0930   PHURINE 5.5 02/26/2010 0930   GLUCOSEU NEGATIVE 02/26/2010 0930   HGBUR LARGE (A) 02/26/2010 0930   BILIRUBINUR NEGATIVE 02/26/2010 0930   KETONESUR NEGATIVE 02/26/2010 0930   PROTEINUR 30 (A) 02/26/2010 0930   UROBILINOGEN 0.2 02/26/2010 0930   NITRITE NEGATIVE 02/26/2010 0930   LEUKOCYTESUR SMALL (A) 02/26/2010 0930    Lab Results  Component Value Date   BACTERIA FEW (A) 02/26/2010    Pertinent Imaging:  No results found for this or any previous visit.  No results found for this or any previous visit.  No results found for this or any previous visit.  No results found for this or any previous visit.  Results for orders placed during the hospital encounter of 07/20/17  US Renal  Narrative CLINICAL DATA:  75 year old male with urinary retention. Self catheterization. Initial encounter.  EXAM: RENAL / URINARY TRACT ULTRASOUND COMPLETE  COMPARISON:  None.  FINDINGS: Right Kidney:  Length: 11.3 cm. Echogenicity within normal limits. No mass or hydronephrosis visualized.  Left Kidney:  Length: 11.8 cm. Echogenicity within normal limits. No mass or hydronephrosis visualized.  Bladder:  Decompressed and not adequately assessed  IMPRESSION: Kidneys unremarkable  Bladder decompressed and not assessed   Electronically Signed By: Genia Del M.D. On: 07/20/2017 12:25  No results found for this or any previous visit.  No results found for  this or any previous visit.  No results found for this or any previous visit.   Assessment & Plan:    1. Urinary retention -Patient wishes to pursue bladder neck incision and mitomycin injection. Risks/benefits/alternatives discussed  2. Chronic cystitis with hematuria -self start cipro   No follow-ups on file.  Nicolette Bang, MD  [facility] [place]

## 2021-03-12 NOTE — Progress Notes (Signed)
Urological Symptom Review  Patient is experiencing the following symptoms: none   Review of Systems  Gastrointestinal (upper)  : Negative for upper GI symptoms  Gastrointestinal (lower) : Negative for lower GI symptoms  Constitutional : Negative for symptoms  Skin: Negative for skin symptoms  Eyes: Negative for eye symptoms  Ear/Nose/Throat : Sore throat  Hematologic/Lymphatic: Negative for Hematologic/Lymphatic symptoms  Cardiovascular : Negative for cardiovascular symptoms  Respiratory : Negative for respiratory symptoms  Endocrine: Negative for endocrine symptoms  Musculoskeletal: Negative for musculoskeletal symptoms  Neurological: Negative for neurological symptoms  Psychologic: Negative for psychiatric symptoms

## 2021-03-26 DIAGNOSIS — M4310 Spondylolisthesis, site unspecified: Secondary | ICD-10-CM | POA: Diagnosis not present

## 2021-04-03 DIAGNOSIS — Z23 Encounter for immunization: Secondary | ICD-10-CM | POA: Diagnosis not present

## 2021-04-18 NOTE — Patient Instructions (Signed)
Marc Burns  04/18/2021     @PREFPERIOPPHARMACY @   Your procedure is scheduled on  04/24/2021   [facility] to [name] at  [facility]   Call this number if you have problems the morning of surgery:  [phone]   Remember:  Do not eat or drink after midnight.                         Take these medicines the morning of surgery with A SIP OF WATER  Amlodipine, metoprolol, omeprazole, ditropan.  DO NOT take any medications for diabetes the morning of your procedure.  If your glucose is 70 or below the morning of your procedure, drink 1/2 cup of clear liquids containing sugar and recheck your glucose in 15 minutes. If your glucose is still 70 or below, call [phone] for instructions.  If your glucose is 300 or above the morning of your procedure, call [phone] for instructions.     Do not wear jewelry, make-up or nail polish.  Do not wear lotions, powders, or perfumes, or deodorant.  Do not shave 48 hours prior to surgery.  Men may shave face and neck.  Do not bring valuables to the hospital.  [facility] is not responsible for any belongings or valuables.  Contacts, dentures or bridgework may not be worn into surgery.  Leave your suitcase in the car.  After surgery it may be brought to your room.  For patients admitted to the hospital, discharge time will be determined by your treatment team.  Patients discharged the day of surgery will not be allowed to drive home and must have someone with them for 24 hours.   Place clean sheets on your bed the night before your procedure and DO NOT sleep with pets this night.  Shower with CHG the night before and the morning of your procedure. DO NOT put CHG on your face, hair or genitals.  After each shower, dry off with a clean towel, put on clean, comfortable clothes and brush your teeth.     Special instructions:  DO NOT smoke tobacco or vape for 24 hours before your procedure.   Please read over the following  fact sheets that you were given. Coughing and Deep Breathing, Surgical Site Infection Prevention, Anesthesia Post-op Instructions and Care and Recovery After Surgery      Urethrotomy, Care After This sheet gives you information about how to care for yourself after your procedure. Your health care provider may also give you more specific instructions. If you have problems or questions, contact your health care provider. What can I expect after the procedure? After the procedure, it is common to have:  Burning pain when urinating.  Pain or discomfort in your genital area.  A small amount of blood in your urine. Your health care provider will tell you how long you can expect to have blood in your urine.  Bloody urine leaking from around your catheter. Follow these instructions at home: Catheter and drainage bag  Follow instructions from your health care provider about how to care for your catheter and your drainage bag.  Do not take baths, swim, or use a hot tub until your catheter has been removed. You may take showers while your catheter is in place.  If you have to insert a catheter on your own (self-catheterization) after your catheter is removed, make sure you understand the procedure completely. Carefully follow instructions from your health  care provider.   Medicines  Take over-the-counter and prescription medicines only as told by your health care provider.  If you were prescribed an antibiotic medicine, take it as told by your health care provider. Do not stop taking the antibiotic even if you start to feel better.  Ask your health care provider if the medicine prescribed to you: ? Requires you to avoid driving or using heavy machinery. ? Can cause constipation. You may need to take these actions to prevent or treat constipation:  Take over-the-counter or prescription medicines.  Eat foods that are high in fiber, such as beans, whole grains, and fresh fruits and  vegetables.  Limit foods that are high in fat and processed sugars, such as fried or sweet foods. Activity  Do not drive for 24 hours if you were given a sedative during your procedure.  Take short walks several times a day during your recovery.  Do not lift anything that is heavier than 10 lb (4.5 kg), or the limit that you are told, until your health care provider says that it is safe.  Return to your normal activities as told by your health care provider. Ask your health care provider what activities are safe for you.  Do not have sex until your health care provider says it is okay.   General instructions  Drink enough fluid to keep your urine pale yellow.  If a bandage (dressing) was applied over the opening of your urethra, change the dressing as told by your health care provider. Make sure you: ? Wash your hands with soap and water before and after you change your dressing. If soap and water are not available, use hand sanitizer. ? Keep your dressing clean and dry.  Do not use any products that contain nicotine or tobacco, such as cigarettes, e-cigarettes, and chewing tobacco. These can delay healing after surgery. If you need help quitting, ask your health care provider.  Keep all follow-up visits as told by your health care provider. This is important. Contact a health care provider if you:  Have a fever or chills.  Have pain that gets worse or does not get better with medicine.  Have blood in your urine for longer than your health care provider told you to expect.  Are a male and have any of these problems: ? Trouble getting an erection. ? Pain when you have an erection. ? Blood in your semen.  Have any of these problems after your catheter is removed: ? Trouble urinating. ? A slow urine stream. ? Urinating less than usual. ? Several streams or "spray" when you urinate.  Have pain in your abdomen.  Have swelling in your genital area that does not go away. Get  help right away if:  You have severe pain.  A lot of blood is leaking from around your catheter.  You have blood clots in your urine.  Your catheter stops draining urine.  You cannot urinate after your catheter is removed.  You have redness, warmth, or pain in your leg.  You have chest pain.  You have trouble breathing. These symptoms may represent a serious problem that is an emergency. Do not wait to see if the symptoms will go away. Get medical help right away. Call your local emergency services (911 in the [place] Do not drive yourself to the hospital. Summary  After the procedure, it is common to have burning pain when urinating and a small amount of blood in your urine.  Follow instructions  from your health care provider about how to care for your catheter and your drainage bag.  Take short walks several times a day during your recovery.  If a bandage (dressing) was applied over the opening of your urethra, change the dressing as told by your health care provider.  Contact your health care provider if you have trouble urinating after your catheter is removed. This information is not intended to replace advice given to you by your health care provider. Make sure you discuss any questions you have with your health care provider. Document Revised: 06/12/2019 Document Reviewed: 06/12/2019 Elsevier Patient Education  2021 [place] Anesthesia, Adult, Care After This sheet gives you information about how to care for yourself after your procedure. Your health care provider may also give you more specific instructions. If you have problems or questions, contact your health care provider. What can I expect after the procedure? After the procedure, the following side effects are common:  Pain or discomfort at the IV site.  Nausea.  Vomiting.  Sore throat.  Trouble concentrating.  Feeling cold or chills.  Feeling weak or tired.  Sleepiness and  fatigue.  Soreness and body aches. These side effects can affect parts of the body that were not involved in surgery. Follow these instructions at home: For the time period you were told by your health care provider:  Rest.  Do not participate in activities where you could fall or become injured.  Do not drive or use machinery.  Do not drink alcohol.  Do not take sleeping pills or medicines that cause drowsiness.  Do not make important decisions or sign legal documents.  Do not take care of children on your own.   Eating and drinking  Follow any instructions from your health care provider about eating or drinking restrictions.  When you feel hungry, start by eating small amounts of foods that are soft and easy to digest (bland), such as toast. Gradually return to your regular diet.  Drink enough fluid to keep your urine pale yellow.  If you vomit, rehydrate by drinking water, juice, or clear broth. General instructions  If you have sleep apnea, surgery and certain medicines can increase your risk for breathing problems. Follow instructions from your health care provider about wearing your sleep device: ? Anytime you are sleeping, including during daytime naps. ? While taking prescription pain medicines, sleeping medicines, or medicines that make you drowsy.  Have a responsible adult stay with you for the time you are told. It is important to have someone help care for you until you are awake and alert.  Return to your normal activities as told by your health care provider. Ask your health care provider what activities are safe for you.  Take over-the-counter and prescription medicines only as told by your health care provider.  If you smoke, do not smoke without supervision.  Keep all follow-up visits as told by your health care provider. This is important. Contact a health care provider if:  You have nausea or vomiting that does not get better with medicine.  You  cannot eat or drink without vomiting.  You have pain that does not get better with medicine.  You are unable to pass urine.  You develop a skin rash.  You have a fever.  You have redness around your IV site that gets worse. Get help right away if:  You have difficulty breathing.  You have chest pain.  You have blood in  your urine or stool, or you vomit blood. Summary  After the procedure, it is common to have a sore throat or nausea. It is also common to feel tired.  Have a responsible adult stay with you for the time you are told. It is important to have someone help care for you until you are awake and alert.  When you feel hungry, start by eating small amounts of foods that are soft and easy to digest (bland), such as toast. Gradually return to your regular diet.  Drink enough fluid to keep your urine pale yellow.  Return to your normal activities as told by your health care provider. Ask your health care provider what activities are safe for you. This information is not intended to replace advice given to you by your health care provider. Make sure you discuss any questions you have with your health care provider. Document Revised: 08/22/2020 Document Reviewed: 03/21/2020 Elsevier Patient Education  2021 [employer].

## 2021-04-18 NOTE — Pre-Procedure Instructions (Signed)
Called Donna Christen in the pharmacy to confirm we would have Mitomycin for 04/24/2021 surgery. He verifies we would.

## 2021-04-19 DIAGNOSIS — I10 Essential (primary) hypertension: Secondary | ICD-10-CM | POA: Diagnosis not present

## 2021-04-19 DIAGNOSIS — Z7984 Long term (current) use of oral hypoglycemic drugs: Secondary | ICD-10-CM | POA: Diagnosis not present

## 2021-04-19 DIAGNOSIS — E1140 Type 2 diabetes mellitus with diabetic neuropathy, unspecified: Secondary | ICD-10-CM | POA: Diagnosis not present

## 2021-04-19 DIAGNOSIS — E7849 Other hyperlipidemia: Secondary | ICD-10-CM | POA: Diagnosis not present

## 2021-04-22 ENCOUNTER — Encounter (HOSPITAL_COMMUNITY): Payer: Self-pay

## 2021-04-22 ENCOUNTER — Other Ambulatory Visit (HOSPITAL_COMMUNITY): Payer: Medicare Other

## 2021-04-22 ENCOUNTER — Other Ambulatory Visit: Payer: Self-pay

## 2021-04-22 ENCOUNTER — Encounter (HOSPITAL_COMMUNITY)
Admission: RE | Admit: 2021-04-22 | Discharge: 2021-04-22 | Disposition: A | Discharge status: Home / Self Care | Payer: Medicare Other | Source: Ambulatory Visit | Attending: Urology | Admitting: Urology

## 2021-04-22 ENCOUNTER — Other Ambulatory Visit (HOSPITAL_COMMUNITY)
Admission: RE | Admit: 2021-04-22 | Discharge: 2021-04-22 | Disposition: A | Discharge status: Home / Self Care | Payer: Medicare Other | Source: Ambulatory Visit | Attending: Urology | Admitting: Urology

## 2021-04-22 DIAGNOSIS — Z01818 Encounter for other preprocedural examination: Secondary | ICD-10-CM | POA: Diagnosis not present

## 2021-04-22 DIAGNOSIS — Z20822 Contact with and (suspected) exposure to covid-19: Secondary | ICD-10-CM | POA: Diagnosis not present

## 2021-04-22 LAB — BASIC METABOLIC PANEL
Anion gap: 7 (ref 5–15)
BUN: 16 mg/dL (ref 8–23)
CO2: 24 mmol/L (ref 22–32)
Calcium: 9.3 mg/dL (ref 8.9–10.3)
Chloride: 107 mmol/L (ref 98–111)
Creatinine, Ser: 0.81 mg/dL (ref 0.61–1.24)
GFR, Estimated: >60 mL/min (ref >60)
Glucose, Bld: 127 mg/dL — ABNORMAL HIGH (ref 70–99)
Potassium: 3.9 mmol/L (ref 3.5–5.1)
Sodium: 138 mmol/L (ref 135–145)

## 2021-04-22 LAB — HEMOGLOBIN A1C
Hgb A1c MFr Bld: 7.2 % — ABNORMAL HIGH (ref 4.8–5.6)
Mean Plasma Glucose: 159.94 mg/dL

## 2021-04-23 LAB — SARS CORONAVIRUS 2 (TAT 6-24 HRS): SARS Coronavirus 2: NEGATIVE

## 2021-04-23 MED ORDER — MITOMYCIN CHEMO INJECTION FOR INTRALESIONAL USE BY UROLOGY (1MG/1ML)
10.0000 mg | Freq: Once | INTRAVENOUS | Status: DC
  Filled 2021-04-23: qty 10

## 2021-04-23 NOTE — Pre-Procedure Instructions (Signed)
Hgba1c routed to PCP. 

## 2021-04-24 ENCOUNTER — Ambulatory Visit (HOSPITAL_COMMUNITY): Payer: Medicare Other | Admitting: Anesthesiology

## 2021-04-24 ENCOUNTER — Encounter (HOSPITAL_COMMUNITY): Payer: Self-pay | Admitting: Urology

## 2021-04-24 ENCOUNTER — Encounter (HOSPITAL_COMMUNITY)
Admission: RE | Disposition: A | Discharge status: Home / Self Care | Payer: Self-pay | Source: Home / Self Care | Attending: Urology

## 2021-04-24 ENCOUNTER — Ambulatory Visit (HOSPITAL_COMMUNITY)
Admission: RE | Admit: 2021-04-24 | Discharge: 2021-04-24 | Disposition: A | Discharge status: Home / Self Care | Payer: Medicare Other | Attending: Urology | Admitting: Urology

## 2021-04-24 DIAGNOSIS — Z882 Allergy status to sulfonamides status: Secondary | ICD-10-CM | POA: Diagnosis not present

## 2021-04-24 DIAGNOSIS — N320 Bladder-neck obstruction: Secondary | ICD-10-CM | POA: Diagnosis present

## 2021-04-24 HISTORY — DX: Failed or difficult intubation, initial encounter: T88.4XXA

## 2021-04-24 HISTORY — PX: MITOMYCIN C INJECTION: SHX6376

## 2021-04-24 HISTORY — PX: FULGURATION OF LESION: SHX6726

## 2021-04-24 HISTORY — PX: CYSTOSCOPY WITH DIRECT VISION INTERNAL URETHROTOMY: SHX6637

## 2021-04-24 HISTORY — DX: Other complications of anesthesia, initial encounter: T88.59XA

## 2021-04-24 LAB — GLUCOSE, CAPILLARY
Glucose-Capillary: 122 mg/dL — ABNORMAL HIGH (ref 70–99)
Glucose-Capillary: 107 mg/dL — ABNORMAL HIGH (ref 70–99)

## 2021-04-24 SURGERY — CYSTOSCOPY, WITH DIRECT VISION INTERNAL URETHROTOMY
Anesthesia: General | Site: Bladder

## 2021-04-24 MED ORDER — ONDANSETRON HCL 4 MG/2ML IJ SOLN
INTRAMUSCULAR | Status: AC
  Filled 2021-04-24: qty 2

## 2021-04-24 MED ORDER — PROPOFOL 10 MG/ML IV BOLUS
INTRAVENOUS | Status: DC | PRN
  Administered 2021-04-24: 150 mg via INTRAVENOUS
  Administered 2021-04-24: 20 mg via INTRAVENOUS

## 2021-04-24 MED ORDER — LIDOCAINE HCL (PF) 2 % IJ SOLN
INTRAMUSCULAR | Status: AC
  Filled 2021-04-24: qty 5

## 2021-04-24 MED ORDER — PROPOFOL 10 MG/ML IV BOLUS
INTRAVENOUS | Status: AC
  Filled 2021-04-24: qty 40

## 2021-04-24 MED ORDER — SEVOFLURANE IN SOLN
RESPIRATORY_TRACT | Status: AC
  Filled 2021-04-24: qty 250

## 2021-04-24 MED ORDER — CHLORHEXIDINE GLUCONATE 0.12 % MT SOLN
OROMUCOSAL | Status: AC
  Administered 2021-04-24: 15 mL via OROMUCOSAL
  Filled 2021-04-24: qty 15

## 2021-04-24 MED ORDER — CHLORHEXIDINE GLUCONATE 0.12 % MT SOLN: 15.0000 mL | Freq: Once | OROMUCOSAL | Status: AC

## 2021-04-24 MED ORDER — MITOMYCIN CHEMO FOR BLADDER INSTILLATION 40 MG
INTRAVENOUS | Status: DC | PRN
  Administered 2021-04-24: 5 mg via INTRAVESICAL

## 2021-04-24 MED ORDER — LIDOCAINE HCL (CARDIAC) PF 100 MG/5ML IV SOSY
PREFILLED_SYRINGE | INTRAVENOUS | Status: DC | PRN
  Administered 2021-04-24: 60 mg via INTRAVENOUS

## 2021-04-24 MED ORDER — STERILE WATER FOR IRRIGATION IR SOLN
Status: DC | PRN
  Administered 2021-04-24: 500 mL

## 2021-04-24 MED ORDER — ONDANSETRON HCL 4 MG/2ML IJ SOLN: 4.0000 mg | Freq: Once | INTRAMUSCULAR | Status: DC | PRN

## 2021-04-24 MED ORDER — CEFAZOLIN SODIUM-DEXTROSE 2-4 GM/100ML-% IV SOLN
INTRAVENOUS | Status: AC
  Filled 2021-04-24: qty 100

## 2021-04-24 MED ORDER — FENTANYL CITRATE (PF) 100 MCG/2ML IJ SOLN
INTRAMUSCULAR | Status: AC
  Filled 2021-04-24: qty 2

## 2021-04-24 MED ORDER — FENTANYL CITRATE (PF) 100 MCG/2ML IJ SOLN: 25.0000 ug | INTRAMUSCULAR | Status: DC | PRN

## 2021-04-24 MED ORDER — STERILE WATER FOR IRRIGATION IR SOLN
Status: DC | PRN
  Administered 2021-04-24 (×2): 3000 mL

## 2021-04-24 MED ORDER — LACTATED RINGERS IV SOLN: INTRAVENOUS | Status: DC

## 2021-04-24 MED ORDER — EPHEDRINE SULFATE 50 MG/ML IJ SOLN
INTRAMUSCULAR | Status: DC | PRN
  Administered 2021-04-24: 5 mg via INTRAVENOUS

## 2021-04-24 MED ORDER — ONDANSETRON HCL 4 MG/2ML IJ SOLN
INTRAMUSCULAR | Status: DC | PRN
  Administered 2021-04-24: 4 mg via INTRAVENOUS

## 2021-04-24 MED ORDER — CEFAZOLIN SODIUM-DEXTROSE 2-4 GM/100ML-% IV SOLN
2.0000 g | INTRAVENOUS | Status: AC
  Administered 2021-04-24: 2 g via INTRAVENOUS

## 2021-04-24 MED ORDER — ORAL CARE MOUTH RINSE: 15.0000 mL | Freq: Once | OROMUCOSAL | Status: AC

## 2021-04-24 MED ORDER — FENTANYL CITRATE (PF) 250 MCG/5ML IJ SOLN
INTRAMUSCULAR | Status: DC | PRN
  Administered 2021-04-24 (×4): 25 ug via INTRAVENOUS

## 2021-04-24 SURGICAL SUPPLY — 22 items
BAG DRAIN URO TABLE W/ADPT NS (BAG) ×4 IMPLANT
BAG DRN 8 ADPR NS SKTRN CSTL (BAG) ×3
BAG DRN RND TRDRP ANRFLXCHMBR (UROLOGICAL SUPPLIES) ×3
BAG HAMPER (MISCELLANEOUS) ×4 IMPLANT
BAG URINE DRAIN 2000ML AR STRL (UROLOGICAL SUPPLIES) ×4 IMPLANT
CATH FOLEY LATEX FREE 20FR (CATHETERS) ×4
CATH FOLEY LF 20FR (CATHETERS) ×3 IMPLANT
GLOVE BIO SURGEON STRL SZ8 (GLOVE) ×4 IMPLANT
GLOVE SURG UNDER POLY LF SZ7 (GLOVE) ×8 IMPLANT
GOWN STRL REUS W/TWL LRG LVL3 (GOWN DISPOSABLE) ×4 IMPLANT
GOWN STRL REUS W/TWL XL LVL3 (GOWN DISPOSABLE) ×4 IMPLANT
KIT CHEMO SPILL (MISCELLANEOUS) ×4 IMPLANT
KIT TURNOVER CYSTO (KITS) ×4 IMPLANT
NEEDLE ASPIRATION 22 (NEEDLE) ×4 IMPLANT
PACK CYSTO (CUSTOM PROCEDURE TRAY) ×4 IMPLANT
PAD ARMBOARD 7.5X6 YLW CONV (MISCELLANEOUS) ×4 IMPLANT
SCALPEL HALF MOON BLADE (MISCELLANEOUS) ×4 IMPLANT
SYR BULB IRRIG 60ML STRL (SYRINGE) ×4 IMPLANT
SYR CONTROL 10ML LL (SYRINGE) ×4 IMPLANT
TOWEL NATURAL 4PK STERILE (DISPOSABLE) ×4 IMPLANT
WATER STERILE IRR 3000ML UROMA (IV SOLUTION) ×8 IMPLANT
WATER STERILE IRR 500ML POUR (IV SOLUTION) ×4 IMPLANT

## 2021-04-24 NOTE — Anesthesia Procedure Notes (Signed)
Procedure Name: LMA Insertion Date/Time: 04/24/2021 8:04 AM Performed by: Karna Dupes, CRNA Pre-anesthesia Checklist: Patient identified, Emergency Drugs available, Suction available and Patient being monitored Patient Re-evaluated:Patient Re-evaluated prior to induction Oxygen Delivery Method: Circle system utilized Preoxygenation: Pre-oxygenation with 100% oxygen Induction Type: IV induction LMA: LMA inserted LMA Size: 4.0 Tube secured with: Tape Dental Injury: Teeth and Oropharynx as per pre-operative assessment

## 2021-04-24 NOTE — H&P (Signed)
Urology Admission H&P  Chief Complaint: bladder neck contracture  History of Present Illness: [name] is a 75yo here for urethral dilation for a bladder neck contracture. He currently has an indwelling foley.   Past Medical History:  Diagnosis Date  . Arthritis   . Bladder neck contracture   . Complication of anesthesia    Difficult intubation  . Difficult intubation   . GERD (gastroesophageal reflux disease)   . History of gastric ulcer    2007  . History of kidney stones   . History of prostate cancer    2004--  S/P PROSTATECTOMY  . Hypertension   . Urinary incontinence   . Wears glasses    Past Surgical History:  Procedure Laterality Date  . BALLOON DILATION N/A 09/12/2013   Procedure: CYSTO BALLOON DILATION/RETROGRADE URETHROGRAM;  Surgeon: Reece Packer, MD;  Location: [facility];  Service: Urology;  Laterality: N/A;  . CYSTO/  URETHRAL DILATION/ BLADDER BX/ BILATERAL RETROGRADE PYELOGRAM  03-03-2010  . CYSTO/ BALLOON DILATION AND VAPORIZATION BLADDER NECK & PROSTATIC FOSSA  04-28-2010  . CYSTO/ BALLOON DILATION BLADDER NECK CONTRACTURE  07-23-2011;   11-17-2010;  08-08-2010  . PROSTATECTOMY  2004   RADICAL  . TONSILLECTOMY  AS CHILD  . TRANSURETHRAL INCISION AND RESECTION OF BLADDER NECK CONTRACTURE  09-01-2010  . UMBILICAL HERNIA REPAIR      Home Medications:  Current Facility-Administered Medications  Medication Dose Route Frequency Provider Last Rate Last Admin  . ceFAZolin (ANCEF) 2-4 GM/100ML-% IVPB           . ceFAZolin (ANCEF) IVPB 2g/100 mL premix  2 g Intravenous 30 min Pre-Op Cleon Gustin, MD      . lactated ringers infusion   Intravenous Continuous Denese Killings, MD 50 mL/hr at 04/24/21 [place] at 04/24/21 0708  . mitoMYcin (MUTAMYCIN) chemo injection for intralesional use by urology 10 mg  10 mg Intralesional Once Alyson Ingles Candee Furbish, MD       Allergies:  Allergies  Allergen Reactions  . Sulfa Antibiotics  Itching and Rash    No family history on file. Social History:  reports that he has never smoked. He has never used smokeless tobacco. He reports current alcohol use of about 6.0 standard drinks of alcohol per week. He reports that he does not use drugs.  Review of Systems  All other systems reviewed and are negative.   Physical Exam:  Vital signs in last 24 hours: Temp:  [98.3 F (36.8 C)] 98.3 F (36.8 C) (05/05 0700) Pulse Rate:  [64] 64 (05/05 0700) BP: (135)/(68) 135/68 (05/05 0700) SpO2:  [95 %] 95 % (05/05 0700) Physical Exam Vitals reviewed.  Constitutional:      Appearance: Normal appearance.  HENT:     Head: Normocephalic and atraumatic.     Nose: Nose normal.     Mouth/Throat:     Mouth: Mucous membranes are dry.  Eyes:     Extraocular Movements: Extraocular movements intact.     Pupils: Pupils are equal, round, and reactive to light.  Cardiovascular:     Rate and Rhythm: Normal rate and regular rhythm.  Pulmonary:     Effort: Pulmonary effort is normal. No respiratory distress.  Abdominal:     General: Abdomen is flat. There is no distension.  Musculoskeletal:        General: No swelling. Normal range of motion.     Cervical back: Normal range of motion and neck supple.  Skin:  General: Skin is warm and dry.  Neurological:     General: No focal deficit present.     Mental Status: He is alert and oriented to person, place, and time.  Psychiatric:        Mood and Affect: Mood normal.        Behavior: Behavior normal.        Thought Content: Thought content normal.        Judgment: Judgment normal.     Laboratory Data:  Results for orders placed or performed during the hospital encounter of 04/24/21 (from the past 24 hour(s))  Glucose, capillary     Status: Abnormal   Collection Time: 04/24/21  6:30 AM  Result Value Ref Range   Glucose-Capillary 122 (H) 70 - 99 mg/dL   Recent Results (from the past 240 hour(s))  SARS CORONAVIRUS 2 (TAT 6-24 HRS)  Nasopharyngeal Nasopharyngeal Swab     Status: None   Collection Time: 04/22/21  3:14 PM   Specimen: Nasopharyngeal Swab  Result Value Ref Range Status   SARS Coronavirus 2 NEGATIVE NEGATIVE Final    Comment: (NOTE) SARS-CoV-2 target nucleic acids are NOT DETECTED.  The SARS-CoV-2 RNA is generally detectable in upper and lower respiratory specimens during the acute phase of infection. Negative results do not preclude SARS-CoV-2 infection, do not rule out co-infections with other pathogens, and should not be used as the sole basis for treatment or other patient management decisions. Negative results must be combined with clinical observations, patient history, and epidemiological information. The expected result is Negative.  Fact Sheet for Patients: SugarRoll.be  Fact Sheet for Healthcare Providers: https://www.woods-mathews.com/  This test is not yet approved or cleared by the [place] FDA and  has been authorized for detection and/or diagnosis of SARS-CoV-2 by FDA under an Emergency Use Authorization (EUA). This EUA will remain  in effect (meaning this test can be used) for the duration of the COVID-19 declaration under Se ction 564(b)(1) of the Act, 21 U.S.C. section 360bbb-3(b)(1), unless the authorization is terminated or revoked sooner.  Performed at [facility], [place] [address] [place], [place] [address]    Creatinine: Recent Labs    04/22/21 1514  CREATININE 0.81   Baseline Creatinine: 0.8  Impression/Assessment:  75yo with bladder neck contracture  Plan:  The risks/benefits/alternatives to DVIU and mitomycin injection was explained to the patient and he understands and wishes to proceed with surgery  Nicolette Bang 04/24/2021, 7:44 AM

## 2021-04-24 NOTE — Op Note (Signed)
Preoperative diagnosis: bladder neck contracture  Postoperative diagnosis: Same  Procedure: 1. Cystoscopy 2. Incision of bladder neck 3. Mitomycin C injection into bladder neck.  4. Fulgeration of anterior bladder wall  Attending: Nicolette Bang  Anesthesia: General  History of blood loss: Minimal  Antibiotics: Ancef  Specimens: none  Drains: 20 french silicone catheter  Findings: dense bladder neckl stricture.  No masses/lesions in the bladder. Bleeding from anterior bladder wall controlled with electrocautery. 5cc of mitomycin c injected into bladder neck  Indications: Patient is a 74 year old male with a history of difficulty with urination and was found to have a bladder neck contracture. After discussing treatment options the patient wishes to proceed with bladder neck incision and injection of mitomycin C.  Procedure in detail: Prior to procedure consent was obtained.  Patient was brought to the operating room and a brief timeout was done to ensure correct patient, correct procedure, correct site.  General anesthesia was administered and patient was placed in dorsal lithotomy position.  His genitalia was then prepped and draped in usual sterile fashion. We then passed a 98 French cystoscope up to the bladder neck where we encountered a dense stricture. We proceeded to make 3 incisions at 5,7, and 12 oclock. We then injected the leaflets with a total of 5cc of mitomycin c. We noted bleeding from the anterior bladder wall which was cauterized with a bugbee. We then removed the cystoscope and placed an 20 French silicone catheter.  This then concluded the procedure which was well tolerated by the patient.  Complications: None  Condition: Stable, extubated, transferred to PACU.  Plan: Patient is to be discharged home.  He is to follow up in 1 week for voiding trial.

## 2021-04-24 NOTE — Transfer of Care (Signed)
Immediate Anesthesia Transfer of Care Note  Patient: Marc Burns  Procedure(s) Performed: CYSTOSCOPY WITH DIRECT VISION INTERNAL URETHROTOM (N/A ) MITOMYCIN C INJECTION (Bladder) FULGURATION OF BLADDER  Patient Location: PACU  Anesthesia Type:General  Level of Consciousness: awake and alert   Airway & Oxygen Therapy: Patient Spontanous Breathing and Patient connected to nasal cannula oxygen  Post-op Assessment: Report given to RN and Post -op Vital signs reviewed and stable  Post vital signs: Reviewed and stable  Last Vitals:  Vitals Value Taken Time  BP 123/58   Temp 97.9   Pulse 78 04/24/21 0856  Resp 16   SpO2 94 % 04/24/21 0856  Vitals shown include unvalidated device data.  Last Pain:  Vitals:   04/24/21 0700  TempSrc: Oral  PainSc: 0-No pain         Complications: No complications documented.

## 2021-04-24 NOTE — Anesthesia Preprocedure Evaluation (Signed)
Anesthesia Evaluation  Patient identified by MRN, date of birth, ID band Patient awake    Reviewed: Allergy & Precautions, NPO status , Patient's Chart, lab work & pertinent test results, reviewed documented beta blocker date and time   History of Anesthesia Complications (+) DIFFICULT AIRWAY and history of anesthetic complications  Airway Mallampati: I  TM Distance: >3 FB Neck ROM: Full    Dental  (+) Dental Advisory Given, Missing   Pulmonary neg pulmonary ROS,    Pulmonary exam normal breath sounds clear to auscultation       Cardiovascular Exercise Tolerance: Good hypertension, Pt. on medications and Pt. on home beta blockers Normal cardiovascular exam Rhythm:Regular Rate:Normal     Neuro/Psych negative neurological ROS  negative psych ROS   GI/Hepatic Neg liver ROS, GERD  Medicated and Controlled,  Endo/Other  negative endocrine ROS  Renal/GU negative Renal ROS     Musculoskeletal  (+) Arthritis ,   Abdominal   Peds  Hematology negative hematology ROS (+)   Anesthesia Other Findings   Reproductive/Obstetrics                            Anesthesia Physical Anesthesia Plan  ASA: II  Anesthesia Plan: General   Post-op Pain Management:    Induction: Intravenous  PONV Risk Score and Plan: 4 or greater and Ondansetron  Airway Management Planned: LMA  Additional Equipment:   Intra-op Plan:   Post-operative Plan: Extubation in OR  Informed Consent: I have reviewed the patients History and Physical, chart, labs and discussed the procedure including the risks, benefits and alternatives for the proposed anesthesia with the patient or authorized representative who has indicated his/her understanding and acceptance.     Dental advisory given  Plan Discussed with: CRNA and Surgeon  Anesthesia Plan Comments:        Anesthesia Quick Evaluation

## 2021-04-24 NOTE — Anesthesia Postprocedure Evaluation (Signed)
Anesthesia Post Note  Patient: Marc Burns  Procedure(s) Performed: CYSTOSCOPY WITH DIRECT VISION INTERNAL URETHROTOMY (N/A ) MITOMYCIN C INJECTION ANTERIOR BLADDER WALL (Bladder) FULGURATION OF BLADDER NECK  Patient location during evaluation: PACU Anesthesia Type: General Level of consciousness: awake and alert and oriented Pain management: pain level controlled Vital Signs Assessment: post-procedure vital signs reviewed and stable Respiratory status: spontaneous breathing and respiratory function stable Cardiovascular status: blood pressure returned to baseline and stable Postop Assessment: no apparent nausea or vomiting Anesthetic complications: no   No complications documented.   Last Vitals:  Vitals:   04/24/21 0930 04/24/21 0935  BP: 132/65 (!) 143/66  Pulse: 70 66  Resp: 13 18  Temp:  36.7 C  SpO2: 95% 98%    Last Pain:  Vitals:   04/24/21 0935  TempSrc: Oral  PainSc: 0-No pain                 Gracelee Stemmler C Woodruff Skirvin

## 2021-04-24 NOTE — Discharge Instructions (Signed)
Indwelling Urinary Catheter Care, Adult An indwelling urinary catheter is a thin tube that is put into your bladder. The tube helps to drain pee (urine) out of your body. The tube goes in through your urethra. Your urethra is where pee comes out of your body. Your pee will come out through the catheter, then it will go into a bag (drainage bag). Take good care of your catheter so it will work well. How to wear your catheter and bag Supplies needed  Sticky tape (adhesive tape) or a leg strap.  Alcohol wipe or soap and water (if you use tape).  A clean towel (if you use tape).  Large overnight bag.  Smaller bag (leg bag). Wearing your catheter Attach your catheter to your leg with tape or a leg strap.  Make sure the catheter is not pulled tight.  If a leg strap gets wet, take it off and put on a dry strap.  If you use tape to hold the bag on your leg: 1. Use an alcohol wipe or soap and water to wash your skin where the tape made it sticky before. 2. Use a clean towel to pat-dry that skin. 3. Use new tape to make the bag stay on your leg. Wearing your bags You should have been given a large overnight bag.  You may wear the overnight bag in the day or night.  Always have the overnight bag lower than your bladder.  Do not let the bag touch the floor.  Before you go to sleep, put a clean plastic bag in a wastebasket. Then hang the overnight bag inside the wastebasket. You should also have a smaller leg bag that fits under your clothes.  Always wear the leg bag below your knee.  Do not wear your leg bag at night. How to care for your skin and catheter Supplies needed  A clean washcloth.  Water and mild soap.  A clean towel. Caring for your skin and catheter  Clean the skin around your catheter every day: 1. Wash your hands with soap and water. 2. Wet a clean washcloth in warm water and mild soap. 3. Clean the skin around your urethra.  If you are male:  Gently  spread the folds of skin around your vagina (labia).  With the washcloth in your other hand, wipe the inner side of your labia on each side. Wipe from front to back.  If you are male:  Pull back any skin that covers the end of your penis (foreskin).  With the washcloth in your other hand, wipe your penis in small circles. Start wiping at the tip of your penis, then move away from the catheter.  Move the foreskin back in place, if needed. 4. With your free hand, hold the catheter close to where it goes into your body.  Keep holding the catheter during cleaning so it does not get pulled out. 5. With the washcloth in your other hand, clean the catheter.  Only wipe downward on the catheter.  Do not wipe upward toward your body. Doing this may push germs into your urethra and cause infection. 6. Use a clean towel to pat-dry the catheter and the skin around it. Make sure to wipe off all soap. 7. Wash your hands with soap and water.  Shower every day. Do not take baths.  Do not use cream, ointment, or lotion on the area where the catheter goes into your body, unless your doctor tells you to.  Do not   use powders, sprays, or lotions on your genital area.  Check your skin around the catheter every day for signs of infection. Check for: ? Redness, swelling, or pain. ? Fluid or blood. ? Warmth. ? Pus or a bad smell.      How to empty the bag Supplies needed  Rubbing alcohol.  Gauze pad or cotton ball.  Tape or a leg strap. Emptying the bag Pour the pee out of your bag when it is ?- full, or at least 2-3 times a day. Do this for your overnight bag and your leg bag. 1. Wash your hands with soap and water. 2. Separate (detach) the bag from your leg. 3. Hold the bag over the toilet or a clean pail. Keep the bag lower than your hips and bladder. This is so the pee (urine) does not go back into the tube. 4. Open the pour spout. It is at the bottom of the bag. 5. Empty the pee into the  toilet or pail. Do not let the pour spout touch any surface. 6. Put rubbing alcohol on a gauze pad or cotton ball. 7. Use the gauze pad or cotton ball to clean the pour spout. 8. Close the pour spout. 9. Attach the bag to your leg with tape or a leg strap. 10. Wash your hands with soap and water. Follow instructions for cleaning the drainage bag:  From the product maker.  As told by your doctor. How to change the bag Supplies needed  Alcohol wipes.  A clean bag.  Tape or a leg strap. Changing the bag Replace your bag when it starts to leak, smell bad, or look dirty. 1. Wash your hands with soap and water. 2. Separate the dirty bag from your leg. 3. Pinch the catheter with your fingers so that pee does not spill out. 4. Separate the catheter tube from the bag tube where these tubes connect (at the connection valve). Do not let the tubes touch any surface. 5. Clean the end of the catheter tube with an alcohol wipe. Use a different alcohol wipe to clean the end of the bag tube. 6. Connect the catheter tube to the tube of the clean bag. 7. Attach the clean bag to your leg with tape or a leg strap. Do not make the bag tight on your leg. 8. Wash your hands with soap and water. General rules  Never pull on your catheter. Never try to take it out. Doing that can hurt you.  Always wash your hands before and after you touch your catheter or bag. Use a mild, fragrance-free soap. If you do not have soap and water, use hand sanitizer.  Always make sure there are no twists or bends (kinks) in the catheter tube.  Always make sure there are no leaks in the catheter or bag.  Drink enough fluid to keep your pee pale yellow.  Do not take baths, swim, or use a hot tub.  If you are male, wipe from front to back after you poop (have a bowel movement).   Contact a doctor if:  Your pee is cloudy.  Your pee smells worse than usual.  Your catheter gets clogged.  Your catheter  leaks.  Your bladder feels full. Get help right away if:  You have redness, swelling, or pain where the catheter goes into your body.  You have fluid, blood, pus, or a bad smell coming from the area where the catheter goes into your body.  Your skin feels   warm where the catheter goes into your body.  You have a fever.  You have pain in your: ? Belly (abdomen). ? Legs. ? Lower back. ? Bladder.  You see blood in the catheter.  Your pee is pink or red.  You feel sick to your stomach (nauseous).  You throw up (vomit).  You have chills.  Your pee is not draining into the bag.  Your catheter gets pulled out. Summary  An indwelling urinary catheter is a thin tube that is placed into the bladder to help drain pee (urine) out of the body.  The catheter is placed into the part of the body that drains pee from the bladder (urethra).  Taking good care of your catheter will keep it working properly and help prevent problems.  Always wash your hands before and after touching your catheter or bag.  Never pull on your catheter or try to take it out. This information is not intended to replace advice given to you by your health care provider. Make sure you discuss any questions you have with your health care provider. Document Revised: 03/31/2019 Document Reviewed: 07/23/2017 Elsevier Patient Education  2021 Elsevier Inc.     General Anesthesia, Adult, Care After This sheet gives you information about how to care for yourself after your procedure. Your health care provider may also give you more specific instructions. If you have problems or questions, contact your health care provider. What can I expect after the procedure? After the procedure, the following side effects are common:  Pain or discomfort at the IV site.  Nausea.  Vomiting.  Sore throat.  Trouble concentrating.  Feeling cold or chills.  Feeling weak or tired.  Sleepiness and fatigue.  Soreness and  body aches. These side effects can affect parts of the body that were not involved in surgery. Follow these instructions at home: For the time period you were told by your health care provider:  Rest.  Do not participate in activities where you could fall or become injured.  Do not drive or use machinery.  Do not drink alcohol.  Do not take sleeping pills or medicines that cause drowsiness.  Do not make important decisions or sign legal documents.  Do not take care of children on your own.   Eating and drinking  Follow any instructions from your health care provider about eating or drinking restrictions.  When you feel hungry, start by eating small amounts of foods that are soft and easy to digest (bland), such as toast. Gradually return to your regular diet.  Drink enough fluid to keep your urine pale yellow.  If you vomit, rehydrate by drinking water, juice, or clear broth. General instructions  If you have sleep apnea, surgery and certain medicines can increase your risk for breathing problems. Follow instructions from your health care provider about wearing your sleep device: ? Anytime you are sleeping, including during daytime naps. ? While taking prescription pain medicines, sleeping medicines, or medicines that make you drowsy.  Have a responsible adult stay with you for the time you are told. It is important to have someone help care for you until you are awake and alert.  Return to your normal activities as told by your health care provider. Ask your health care provider what activities are safe for you.  Take over-the-counter and prescription medicines only as told by your health care provider.  If you smoke, do not smoke without supervision.  Keep all follow-up visits as told by your   health care provider. This is important. Contact a health care provider if:  You have nausea or vomiting that does not get better with medicine.  You cannot eat or drink without  vomiting.  You have pain that does not get better with medicine.  You are unable to pass urine.  You develop a skin rash.  You have a fever.  You have redness around your IV site that gets worse. Get help right away if:  You have difficulty breathing.  You have chest pain.  You have blood in your urine or stool, or you vomit blood. Summary  After the procedure, it is common to have a sore throat or nausea. It is also common to feel tired.  Have a responsible adult stay with you for the time you are told. It is important to have someone help care for you until you are awake and alert.  When you feel hungry, start by eating small amounts of foods that are soft and easy to digest (bland), such as toast. Gradually return to your regular diet.  Drink enough fluid to keep your urine pale yellow.  Return to your normal activities as told by your health care provider. Ask your health care provider what activities are safe for you. This information is not intended to replace advice given to you by your health care provider. Make sure you discuss any questions you have with your health care provider. Document Revised: 08/22/2020 Document Reviewed: 03/21/2020 Elsevier Patient Education  2021 Else[employer] Inc.  

## 2021-04-25 ENCOUNTER — Encounter (HOSPITAL_COMMUNITY): Payer: Self-pay | Admitting: Urology

## 2021-04-25 DIAGNOSIS — E1169 Type 2 diabetes mellitus with other specified complication: Secondary | ICD-10-CM | POA: Diagnosis not present

## 2021-04-25 DIAGNOSIS — E7800 Pure hypercholesterolemia, unspecified: Secondary | ICD-10-CM | POA: Diagnosis not present

## 2021-04-25 DIAGNOSIS — E7801 Familial hypercholesterolemia: Secondary | ICD-10-CM | POA: Diagnosis not present

## 2021-04-25 DIAGNOSIS — I10 Essential (primary) hypertension: Secondary | ICD-10-CM | POA: Diagnosis not present

## 2021-04-25 DIAGNOSIS — E785 Hyperlipidemia, unspecified: Secondary | ICD-10-CM | POA: Diagnosis not present

## 2021-04-29 DIAGNOSIS — E7849 Other hyperlipidemia: Secondary | ICD-10-CM | POA: Diagnosis not present

## 2021-04-29 DIAGNOSIS — G250 Essential tremor: Secondary | ICD-10-CM | POA: Diagnosis not present

## 2021-04-29 DIAGNOSIS — E1140 Type 2 diabetes mellitus with diabetic neuropathy, unspecified: Secondary | ICD-10-CM | POA: Diagnosis not present

## 2021-04-29 DIAGNOSIS — L8991 Pressure ulcer of unspecified site, stage 1: Secondary | ICD-10-CM | POA: Diagnosis not present

## 2021-04-29 DIAGNOSIS — I10 Essential (primary) hypertension: Secondary | ICD-10-CM | POA: Diagnosis not present

## 2021-04-29 DIAGNOSIS — E1169 Type 2 diabetes mellitus with other specified complication: Secondary | ICD-10-CM | POA: Diagnosis not present

## 2021-04-29 DIAGNOSIS — E11622 Type 2 diabetes mellitus with other skin ulcer: Secondary | ICD-10-CM | POA: Diagnosis not present

## 2021-05-02 ENCOUNTER — Encounter: Payer: Self-pay | Admitting: Urology

## 2021-05-02 ENCOUNTER — Ambulatory Visit (INDEPENDENT_AMBULATORY_CARE_PROVIDER_SITE_OTHER): Payer: Medicare Other | Admitting: Urology

## 2021-05-02 ENCOUNTER — Other Ambulatory Visit: Payer: Self-pay

## 2021-05-02 VITALS — BP 130/72 | HR 65 | Temp 98.7°F | Ht 69.0 in | Wt 260.0 lb

## 2021-05-02 DIAGNOSIS — R339 Retention of urine, unspecified: Secondary | ICD-10-CM

## 2021-05-02 MED ORDER — GEMTESA 75 MG PO TABS: 1.0000 | ORAL_TABLET | Freq: Every day | ORAL | 0 refills | Status: DC

## 2021-05-02 NOTE — Patient Instructions (Signed)
Overactive Bladder, Adult  Overactive bladder is a condition in which a person has a sudden and frequent need to urinate. A person might also leak urine if he or she cannot get to the bathroom fast enough (urinary incontinence). Sometimes, symptoms can interfere with work or social activities. What are the causes? Overactive bladder is associated with poor nerve signals between your bladder and your brain. Your bladder may get the signal to empty before it is full. You may also have very sensitive muscles that make your bladder squeeze too soon. This condition may also be caused by other factors, such as:  Medical conditions: ? Urinary tract infection. ? Infection of nearby tissues. ? Prostate enlargement. ? Bladder stones, inflammation, or tumors. ? Diabetes. ? Muscle or nerve weakness, especially from these conditions:  A spinal cord injury.  Stroke.  Multiple sclerosis.  Parkinson's disease.  Other causes: ? Surgery on the uterus or urethra. ? Drinking too much caffeine or alcohol. ? Certain medicines, especially those that eliminate extra fluid in the body (diuretics). ? Constipation. What increases the risk? You may be at greater risk for overactive bladder if you:  Are an older adult.  Smoke.  Are going through menopause.  Have prostate problems.  Have a neurological disease, such as stroke, dementia, Parkinson's disease, or multiple sclerosis (MS).  Eat or drink alcohol, spicy food, caffeine, and other things that irritate the bladder.  Are overweight or obese. What are the signs or symptoms? Symptoms of this condition include a sudden, strong urge to urinate. Other symptoms include:  Leaking urine.  Urinating 8 or more times a day.  Waking up to urinate 2 or more times overnight. How is this diagnosed? This condition may be diagnosed based on:  Your symptoms and medical history.  A physical exam.  Blood or urine tests to check for possible causes,  such as infection. You may also need to see a health care provider who specializes in urinary tract problems. This is called a urologist. How is this treated? Treatment for overactive bladder depends on the cause of your condition and whether it is mild or severe. Treatment may include:  Bladder training, such as: ? Learning to control the urge to urinate by following a schedule to urinate at regular intervals. ? Doing Kegel exercises to strengthen the pelvic floor muscles that support your bladder.  Special devices, such as: ? Biofeedback. This uses sensors to help you become aware of your body's signals. ? Electrical stimulation. This uses electrodes placed inside the body (implanted) or outside the body. These electrodes send gentle pulses of electricity to strengthen the nerves or muscles that control the bladder. ? Women may use a plastic device, called a pessary, that fits into the vagina and supports the bladder.  Medicines, such as: ? Antibiotics to treat bladder infection. ? Antispasmodics to stop the bladder from releasing urine at the wrong time. ? Tricyclic antidepressants to relax bladder muscles. ? Injections of botulinum toxin type A directly into the bladder tissue to relax bladder muscles.  Surgery, such as: ? A device may be implanted to help manage the nerve signals that control urination. ? An electrode may be implanted to stimulate electrical signals in the bladder. ? A procedure may be done to change the shape of the bladder. This is done only in very severe cases. Follow these instructions at home: Eating and drinking  Make diet or lifestyle changes recommended by your health care provider. These may include: ? Drinking fluids   throughout the day and not only with meals. ? Cutting down on caffeine or alcohol. ? Eating a healthy and balanced diet to prevent constipation. This may include:  Choosing foods that are high in fiber, such as beans, whole grains, and  fresh fruits and vegetables.  Limiting foods that are high in fat and processed sugars, such as fried and sweet foods.   Lifestyle  Lose weight if needed.  Do not use any products that contain nicotine or tobacco. These include cigarettes, chewing tobacco, and vaping devices, such as e-cigarettes. If you need help quitting, ask your health care provider.   General instructions  Take over-the-counter and prescription medicines only as told by your health care provider.  If you were prescribed an antibiotic medicine, take it as told by your health care provider. Do not stop taking the antibiotic even if you start to feel better.  Use any implants or pessary as told by your health care provider.  If needed, wear pads to absorb urine leakage.  Keep a log to track how much and when you drink, and when you need to urinate. This will help your health care provider monitor your condition.  Keep all follow-up visits. This is important. Contact a health care provider if:  You have a fever or chills.  Your symptoms do not get better with treatment.  Your pain and discomfort get worse.  You have more frequent urges to urinate. Get help right away if:  You are not able to control your bladder. Summary  Overactive bladder refers to a condition in which a person has a sudden and frequent need to urinate.  Several conditions may lead to an overactive bladder.  Treatment for overactive bladder depends on the cause and severity of your condition.  Making lifestyle changes, doing Kegel exercises, keeping a log, and taking medicines can help with this condition. This information is not intended to replace advice given to you by your health care provider. Make sure you discuss any questions you have with your health care provider. Document Revised: 08/26/2020 Document Reviewed: 08/26/2020 Elsevier Patient Education  2021 Elsevier Inc.  

## 2021-05-02 NOTE — Progress Notes (Signed)
05/02/2021 10:27 AM   Marc Burns 09-24-1946 [id number]  Referring provider: Rory Percy, MD [place],  Page [phone]  followup bladder neck contracture  HPI: [name] is a 75yo here for followup after incision in bladder neck for bladder neck contracture. He is having increased bladder spasms and incontinence around the foley. He is currently on oxybutynin.    PMH: Past Medical History:  Diagnosis Date  . Arthritis   . Bladder neck contracture   . Complication of anesthesia    Difficult intubation  . Difficult intubation   . GERD (gastroesophageal reflux disease)   . History of gastric ulcer    2007  . History of kidney stones   . History of prostate cancer    2004--  S/P PROSTATECTOMY  . Hypertension   . Urinary incontinence   . Wears glasses     Surgical History: Past Surgical History:  Procedure Laterality Date  . BALLOON DILATION N/A 09/12/2013   Procedure: CYSTO BALLOON DILATION/RETROGRADE URETHROGRAM;  Surgeon: Reece Packer, MD;  [facility];  Service: Urology;  Laterality: N/A;  . CYSTO/  URETHRAL DILATION/ BLADDER BX/ BILATERAL RETROGRADE PYELOGRAM  03-03-2010  . CYSTO/ BALLOON DILATION AND VAPORIZATION BLADDER NECK & PROSTATIC FOSSA  04-28-2010  . CYSTO/ BALLOON DILATION BLADDER NECK CONTRACTURE  07-23-2011;   11-17-2010;  08-08-2010  . CYSTOSCOPY WITH DIRECT VISION INTERNAL URETHROTOMY N/A 04/24/2021   Procedure: CYSTOSCOPY WITH DIRECT VISION INTERNAL URETHROTOMY;  Surgeon: Cleon Gustin, MD;  Location: [facility];  Service: Urology;  Laterality: N/A;  . FULGURATION OF LESION  04/24/2021   Procedure: FULGURATION OF BLADDER NECK;  Surgeon: Cleon Gustin, MD;  Location: [facility];  Service: Urology;;  . MITOMYCIN C INJECTION  04/24/2021   Procedure: MITOMYCIN C INJECTION ANTERIOR BLADDER WALL;  Surgeon: Cleon Gustin, MD;  Location: [facility];  Service: Urology;;  . PROSTATECTOMY  2004   RADICAL  .  TONSILLECTOMY  AS CHILD  . TRANSURETHRAL INCISION AND RESECTION OF BLADDER NECK CONTRACTURE  09-01-2010  . UMBILICAL HERNIA REPAIR      Home Medications:  Allergies as of 05/02/2021      Reactions   Sulfa Antibiotics Itching, Rash      Medication List       Accurate as of May 02, 2021 10:27 AM. If you have any questions, ask your nurse or doctor.        amLODipine-benazepril 5-20 MG capsule Commonly known as: LOTREL Take 1 capsule by mouth every morning.   aspirin EC 81 MG tablet Take 81 mg by mouth daily. Swallow whole.   CENTRUM SILVER ADULT 50+ PO Take 1 tablet by mouth daily.   empagliflozin 25 MG Tabs tablet Commonly known as: JARDIANCE Take 25 mg by mouth daily.   gabapentin 100 MG capsule Commonly known as: NEURONTIN Take 200 mg by mouth at bedtime.   Gemtesa 75 MG Tabs Generic drug: Vibegron Take 1 capsule by mouth daily. Started by: Nicolette Bang, MD   metoprolol tartrate 25 MG tablet Commonly known as: LOPRESSOR Take 25 mg by mouth 2 (two) times daily.   omeprazole 20 MG capsule Commonly known as: PRILOSEC Take 20 mg by mouth every morning.   oxybutynin 10 MG 24 hr tablet Commonly known as: DITROPAN-XL Take 10 mg by mouth daily.   Ozempic (0.25 or 0.5 MG/DOSE) 2 MG/1.5ML Sopn Generic drug: Semaglutide(0.25 or 0.5MG /DOS) Inject 0.5 mg into the skin every Sunday.   rosuvastatin 10  MG tablet Commonly known as: CRESTOR Take 10 mg by mouth at bedtime.       Allergies:  Allergies  Allergen Reactions  . Sulfa Antibiotics Itching and Rash    Family History: History reviewed. No pertinent family history.  Social History:  reports that he has never smoked. He has never used smokeless tobacco. He reports current alcohol use of about 6.0 standard drinks of alcohol per week. He reports that he does not use drugs.  ROS: All other review of systems were reviewed and are negative except what is noted above in HPI  Physical Exam: BP 130/72    Pulse 65   Temp 98.7 F (37.1 C) (Oral)   Ht 5\' 9"  (1.753 m)   Wt 260 lb (117.9 kg)   BMI 38.40 kg/m   Constitutional:  Alert and oriented, No acute distress. HEENT: [place] AT, moist mucus membranes.  Trachea midline, no masses. Cardiovascular: No clubbing, cyanosis, or edema. Respiratory: Normal respiratory effort, no increased work of breathing. GI: Abdomen is soft, nontender, nondistended, no abdominal masses GU: No CVA tenderness.  Lymph: No cervical or inguinal lymphadenopathy. Skin: No rashes, bruises or suspicious lesions. Neurologic: Grossly intact, no focal deficits, moving all 4 extremities. Psychiatric: Normal mood and affect.  Laboratory Data: Lab Results  Component Value Date   WBC 9.4 11/17/2010   HGB 15.0 09/12/2013   HCT 44.0 09/12/2013   MCV 87.8 11/17/2010   PLT 138 (L) 11/17/2010    Lab Results  Component Value Date   CREATININE 0.81 04/22/2021    No results found for: PSA  No results found for: TESTOSTERONE  Lab Results  Component Value Date   HGBA1C 7.2 (H) 04/22/2021    Urinalysis    Component Value Date/Time   COLORURINE YELLOW 02/26/2010 0930   APPEARANCEUR CLOUDY (A) 02/26/2010 0930   LABSPEC 1.019 02/26/2010 0930   PHURINE 5.5 02/26/2010 0930   GLUCOSEU NEGATIVE 02/26/2010 0930   HGBUR LARGE (A) 02/26/2010 0930   BILIRUBINUR NEGATIVE 02/26/2010 0930   KETONESUR NEGATIVE 02/26/2010 0930   PROTEINUR 30 (A) 02/26/2010 0930   UROBILINOGEN 0.2 02/26/2010 0930   NITRITE NEGATIVE 02/26/2010 0930   LEUKOCYTESUR SMALL (A) 02/26/2010 0930    Lab Results  Component Value Date   BACTERIA FEW (A) 02/26/2010    Pertinent Imaging:  No results found for this or any previous visit.  No results found for this or any previous visit.  No results found for this or any previous visit.  No results found for this or any previous visit.  Results for orders placed during the hospital encounter of 07/20/17  US Renal  Narrative CLINICAL DATA:   75 year old male with urinary retention. Self catheterization. Initial encounter.  EXAM: RENAL / URINARY TRACT ULTRASOUND COMPLETE  COMPARISON:  None.  FINDINGS: Right Kidney:  Length: 11.3 cm. Echogenicity within normal limits. No mass or hydronephrosis visualized.  Left Kidney:  Length: 11.8 cm. Echogenicity within normal limits. No mass or hydronephrosis visualized.  Bladder:  Decompressed and not adequately assessed  IMPRESSION: Kidneys unremarkable  Bladder decompressed and not assessed   Electronically Signed By: Genia Del M.D. On: 07/20/2017 12:25  No results found for this or any previous visit.  No results found for this or any previous visit.  No results found for this or any previous visit.   Assessment & Plan:    1. Urinary retention -Patient to remove his foley at home and reinsert if he is unable to void. We will start  gemtesa 75mg  daily    Return in about 1 week (around 05/09/2021) for PVR NV.  Nicolette Bang, MD  [facility] [place]

## 2021-05-08 ENCOUNTER — Telehealth: Payer: Self-pay

## 2021-05-08 NOTE — Telephone Encounter (Signed)
Patient schedule for NV on 05/09/2021 for PVR.  Called patient to see if he had to replace his foley catheter prior to his OV tomorrow. Patient reinserted catheter 2 days after seeing MD post op. Patient would like to wait 1 month prior to coming back to discuss removal of catheter.

## 2021-05-09 ENCOUNTER — Ambulatory Visit: Payer: Medicare Other

## 2021-06-02 DIAGNOSIS — E1140 Type 2 diabetes mellitus with diabetic neuropathy, unspecified: Secondary | ICD-10-CM | POA: Diagnosis not present

## 2021-06-02 DIAGNOSIS — E1169 Type 2 diabetes mellitus with other specified complication: Secondary | ICD-10-CM | POA: Diagnosis not present

## 2021-06-02 DIAGNOSIS — G250 Essential tremor: Secondary | ICD-10-CM | POA: Diagnosis not present

## 2021-06-02 DIAGNOSIS — E11622 Type 2 diabetes mellitus with other skin ulcer: Secondary | ICD-10-CM | POA: Diagnosis not present

## 2021-06-02 DIAGNOSIS — Z6837 Body mass index (BMI) 37.0-37.9, adult: Secondary | ICD-10-CM | POA: Diagnosis not present

## 2021-06-02 DIAGNOSIS — L8991 Pressure ulcer of unspecified site, stage 1: Secondary | ICD-10-CM | POA: Diagnosis not present

## 2021-06-02 DIAGNOSIS — E7849 Other hyperlipidemia: Secondary | ICD-10-CM | POA: Diagnosis not present

## 2021-06-02 DIAGNOSIS — I10 Essential (primary) hypertension: Secondary | ICD-10-CM | POA: Diagnosis not present

## 2021-06-10 DIAGNOSIS — E11319 Type 2 diabetes mellitus with unspecified diabetic retinopathy without macular edema: Secondary | ICD-10-CM | POA: Diagnosis not present

## 2021-06-16 ENCOUNTER — Ambulatory Visit: Payer: Medicare Other | Admitting: Urology

## 2021-06-18 ENCOUNTER — Ambulatory Visit (INDEPENDENT_AMBULATORY_CARE_PROVIDER_SITE_OTHER): Payer: Medicare Other | Admitting: Urology

## 2021-06-18 ENCOUNTER — Encounter: Payer: Self-pay | Admitting: Urology

## 2021-06-18 ENCOUNTER — Other Ambulatory Visit: Payer: Self-pay

## 2021-06-18 VITALS — BP 124/75 | HR 62 | Temp 98.2°F | Wt 249.8 lb

## 2021-06-18 DIAGNOSIS — N3021 Other chronic cystitis with hematuria: Secondary | ICD-10-CM

## 2021-06-18 DIAGNOSIS — R339 Retention of urine, unspecified: Secondary | ICD-10-CM

## 2021-06-18 MED ORDER — GEMTESA 75 MG PO TABS: 1.0000 | ORAL_TABLET | Freq: Every day | ORAL | 11 refills | Status: DC

## 2021-06-18 NOTE — Patient Instructions (Signed)

## 2021-06-18 NOTE — Progress Notes (Signed)
06/18/2021 1:46 PM   Marc Burns 09-25-1946 [id number]  Referring provider: Practice, [facility] [place],  [place] [address]  Followup urinary retention and bladder neck contracture   HPI: Marc Burns is 75yo here for followup for urinary retention and bladder neck contracture. He has had decreased bladder spasms and decreased urinary incontinence since DVIU and mitomicin injection into the bladder neck. He remains on gemtesa. He changes his foley every 2 weeks.    PMH: Past Medical History:  Diagnosis Date   Arthritis    Bladder neck contracture    Complication of anesthesia    Difficult intubation   Difficult intubation    GERD (gastroesophageal reflux disease)    History of gastric ulcer    2007   History of kidney stones    History of prostate cancer    2004--  S/P PROSTATECTOMY   Hypertension    Urinary incontinence    Wears glasses     Surgical History: Past Surgical History:  Procedure Laterality Date   BALLOON DILATION N/A 09/12/2013   Procedure: CYSTO BALLOON DILATION/RETROGRADE URETHROGRAM;  Surgeon: Reece Packer, MD;  Location: [facility];  Service: Urology;  Laterality: N/A;   CYSTO/  URETHRAL DILATION/ BLADDER BX/ BILATERAL RETROGRADE PYELOGRAM  03-03-2010   CYSTO/ BALLOON DILATION AND VAPORIZATION BLADDER NECK & PROSTATIC FOSSA  04-28-2010   CYSTO/ BALLOON DILATION BLADDER NECK CONTRACTURE  07-23-2011;   11-17-2010;  08-08-2010   CYSTOSCOPY WITH DIRECT VISION INTERNAL URETHROTOMY N/A 04/24/2021   Procedure: CYSTOSCOPY WITH DIRECT VISION INTERNAL URETHROTOMY;  Surgeon: Cleon Gustin, MD;  Location: [facility];  Service: Urology;  Laterality: N/A;   FULGURATION OF LESION  04/24/2021   Procedure: FULGURATION OF BLADDER NECK;  Surgeon: Cleon Gustin, MD;  Location: [facility];  Service: Urology;;   MITOMYCIN C INJECTION  04/24/2021   Procedure: MITOMYCIN C INJECTION ANTERIOR BLADDER WALL;  Surgeon: Cleon Gustin, MD;   Location: [facility];  Service: Urology;;   PROSTATECTOMY  2004   RADICAL   TONSILLECTOMY  AS CHILD   TRANSURETHRAL INCISION AND RESECTION OF BLADDER NECK CONTRACTURE  [id number]   UMBILICAL HERNIA REPAIR      Home Medications:  Allergies as of 06/18/2021       Reactions   Sulfa Antibiotics Itching, Rash        Medication List        Accurate as of June 18, 2021  1:46 PM. If you have any questions, ask your nurse or doctor.          amLODipine-benazepril 5-20 MG capsule Commonly known as: LOTREL Take 1 capsule by mouth every morning.   aspirin EC 81 MG tablet Take 81 mg by mouth daily. Swallow whole.   CENTRUM SILVER ADULT 50+ PO Take 1 tablet by mouth daily.   empagliflozin 25 MG Tabs tablet Commonly known as: JARDIANCE Take 25 mg by mouth daily.   gabapentin 100 MG capsule Commonly known as: NEURONTIN Take 200 mg by mouth at bedtime.   Gemtesa 75 MG Tabs Generic drug: Vibegron Take 1 capsule by mouth daily.   metoprolol tartrate 25 MG tablet Commonly known as: LOPRESSOR Take 25 mg by mouth 2 (two) times daily.   omeprazole 20 MG capsule Commonly known as: PRILOSEC Take 20 mg by mouth every morning.   oxybutynin 10 MG 24 hr tablet Commonly known as: DITROPAN-XL Take 10 mg by mouth daily.   Ozempic (0.25 or 0.5 MG/DOSE) 2 MG/1.5ML Sopn  Generic drug: Semaglutide(0.25 or 0.5MG /DOS) Inject 0.5 mg into the skin every Sunday.   rosuvastatin 10 MG tablet Commonly known as: CRESTOR Take 10 mg by mouth at bedtime.        Allergies:  Allergies  Allergen Reactions   Sulfa Antibiotics Itching and Rash    Family History: No family history on file.  Social History:  reports that he has never smoked. He has never used smokeless tobacco. He reports current alcohol use of about 6.0 standard drinks of alcohol per week. He reports that he does not use drugs.  ROS: All other review of systems were reviewed and are negative except what is noted above in  HPI  Physical Exam: BP 124/75   Pulse 62   Temp 98.2 F (36.8 C)   Wt 249 lb 12.8 oz (113.3 kg)   BMI 36.89 kg/m   Constitutional:  Alert and oriented, No acute distress. HEENT: [place] AT, moist mucus membranes.  Trachea midline, no masses. Cardiovascular: No clubbing, cyanosis, or edema. Respiratory: Normal respiratory effort, no increased work of breathing. GI: Abdomen is soft, nontender, nondistended, no abdominal masses GU: No CVA tenderness.  Lymph: No cervical or inguinal lymphadenopathy. Skin: No rashes, bruises or suspicious lesions. Neurologic: Grossly intact, no focal deficits, moving all 4 extremities. Psychiatric: Normal mood and affect.  Laboratory Data: Lab Results  Component Value Date   WBC 9.4 11/17/2010   HGB 15.0 09/12/2013   HCT 44.0 09/12/2013   MCV 87.8 11/17/2010   PLT 138 (L) 11/17/2010    Lab Results  Component Value Date   CREATININE 0.81 04/22/2021    No results found for: PSA  No results found for: TESTOSTERONE  Lab Results  Component Value Date   HGBA1C 7.2 (H) 04/22/2021    Urinalysis    Component Value Date/Time   COLORURINE YELLOW 02/26/2010 0930   APPEARANCEUR CLOUDY (A) 02/26/2010 0930   LABSPEC 1.019 02/26/2010 0930   PHURINE 5.5 02/26/2010 0930   GLUCOSEU NEGATIVE 02/26/2010 0930   HGBUR LARGE (A) 02/26/2010 0930   BILIRUBINUR NEGATIVE 02/26/2010 0930   KETONESUR NEGATIVE 02/26/2010 0930   PROTEINUR 30 (A) 02/26/2010 0930   UROBILINOGEN 0.2 02/26/2010 0930   NITRITE NEGATIVE 02/26/2010 0930   LEUKOCYTESUR SMALL (A) 02/26/2010 0930    Lab Results  Component Value Date   BACTERIA FEW (A) 02/26/2010    Pertinent Imaging:  No results found for this or any previous visit.  No results found for this or any previous visit.  No results found for this or any previous visit.  No results found for this or any previous visit.  Results for orders placed during the hospital encounter of 07/20/17  US  Renal  Narrative CLINICAL DATA:  75 year old male with urinary retention. Self catheterization. Initial encounter.  EXAM: RENAL / URINARY TRACT ULTRASOUND COMPLETE  COMPARISON:  None.  FINDINGS: Right Kidney:  Length: 11.3 cm. Echogenicity within normal limits. No mass or hydronephrosis visualized.  Left Kidney:  Length: 11.8 cm. Echogenicity within normal limits. No mass or hydronephrosis visualized.  Bladder:  Decompressed and not adequately assessed  IMPRESSION: Kidneys unremarkable  Bladder decompressed and not assessed   Electronically Signed By: Genia Del M.D. On: 07/20/2017 12:25  No results found for this or any previous visit.  No results found for this or any previous visit.  No results found for this or any previous visit.   Assessment & Plan:    1. Urinary retention Continue indwelling foley  2. Chronic cystitis  with hematuria -No UTIs since last visit  3. Bladder spasms -We will continue gemtesa -RTC 3 months   No follow-ups on file.  Nicolette Bang, MD  [facility] [place]

## 2021-06-19 DIAGNOSIS — E7849 Other hyperlipidemia: Secondary | ICD-10-CM | POA: Diagnosis not present

## 2021-06-19 DIAGNOSIS — Z7984 Long term (current) use of oral hypoglycemic drugs: Secondary | ICD-10-CM | POA: Diagnosis not present

## 2021-06-19 DIAGNOSIS — E1140 Type 2 diabetes mellitus with diabetic neuropathy, unspecified: Secondary | ICD-10-CM | POA: Diagnosis not present

## 2021-06-19 DIAGNOSIS — I10 Essential (primary) hypertension: Secondary | ICD-10-CM | POA: Diagnosis not present

## 2021-07-03 DIAGNOSIS — I10 Essential (primary) hypertension: Secondary | ICD-10-CM | POA: Diagnosis not present

## 2021-07-03 DIAGNOSIS — M4310 Spondylolisthesis, site unspecified: Secondary | ICD-10-CM | POA: Diagnosis not present

## 2021-07-03 DIAGNOSIS — Z6837 Body mass index (BMI) 37.0-37.9, adult: Secondary | ICD-10-CM | POA: Diagnosis not present

## 2021-07-21 ENCOUNTER — Encounter: Payer: Self-pay | Admitting: Neurology

## 2021-07-21 ENCOUNTER — Ambulatory Visit (INDEPENDENT_AMBULATORY_CARE_PROVIDER_SITE_OTHER): Payer: Medicare Other | Admitting: Neurology

## 2021-07-21 ENCOUNTER — Other Ambulatory Visit: Payer: Self-pay

## 2021-07-21 VITALS — BP 156/84 | HR 61 | Ht 68.0 in | Wt 250.0 lb

## 2021-07-21 DIAGNOSIS — G250 Essential tremor: Secondary | ICD-10-CM | POA: Diagnosis not present

## 2021-07-21 MED ORDER — PRIMIDONE 50 MG PO TABS: 50.0000 mg | ORAL_TABLET | Freq: Every day | ORAL | 5 refills | Status: DC

## 2021-07-21 NOTE — Progress Notes (Signed)
Subjective:    Patient ID: Marc Burns is a 75 y.o. male.  HPI    Star Age, MD, PhD [facility] [address], [address] [place]  [address]  Dear Dr. Jimmye Norman,  I saw your patient, Marc Burns, upon your kind request in my neurologic clinic today for initial consultation of his hand tremor.  The patient is accompanied by his daughter, Manuela Schwartz, today.  As you know, [name] is a 75 year old right-handed gentleman with an underlying medical history of hypertension, diabetes, hyperlipidemia, pressure sore, reflux disease, history of gastric ulcer, history of kidney stones, prostate cancer, and obesity, who reports an approximately 1+ year history of bilateral hand tremors.  He has noted the tremor primarily when he tries to hold a cup or feed himself.  Tremor is bilateral and mostly with holding and doing something with fine motor skills involved, not at rest.  He has not noticed any tremor in the upper body or legs.  He is not aware of any family history of tremors.  He is an only child.  Parents lived to be in their mid to late 41s and both had heart problems.  He has 3 grown children, none with tremors.  There is no obvious trigger or alleviating factor.  He has not had any recent medication changes.  Some days the tremor is hardly noticeable and some other days it is quite significant and bothersome.  He has been worried about having something like Parkinson's disease.  He limits his caffeine to 2 cups of coffee in the morning but does drink some soda 1 or 2 small bottles per day on average.  He tries to hydrate well.  He has tried to exercise his upper body with small weights, up to 5 pounds but did not notice any improvement in his tremor.  He denies sudden onset of one-sided weakness or numbness or tingling or droopy face or slurring of speech, denies any recurrent headaches. I reviewed your office note from 04/29/2021.  He has been on metoprolol.  He  has been on gabapentin for diabetic neuropathy and is currently on 200 mg twice daily.  His last A1c in February 2022 was 7.4.  His Past Medical History Is Significant For: Past Medical History:  Diagnosis Date   Arthritis    Bladder neck contracture    Complication of anesthesia    Difficult intubation   Difficult intubation    GERD (gastroesophageal reflux disease)    History of gastric ulcer    2007   History of kidney stones    History of prostate cancer    2004--  S/P PROSTATECTOMY   Hypertension    Urinary incontinence    Wears glasses     Her Past Surgical History Is Significant For: Past Surgical History:  Procedure Laterality Date   BALLOON DILATION N/A 09/12/2013   Procedure: CYSTO BALLOON DILATION/RETROGRADE URETHROGRAM;  Surgeon: Reece Packer, MD;  [facility]: [facility];  Service: Urology;  Laterality: N/A;   CYSTO/  URETHRAL DILATION/ BLADDER BX/ BILATERAL RETROGRADE PYELOGRAM  03-03-2010   CYSTO/ BALLOON DILATION AND VAPORIZATION BLADDER NECK & PROSTATIC FOSSA  04-28-2010   CYSTO/ BALLOON DILATION BLADDER NECK CONTRACTURE  07-23-2011;   11-17-2010;  08-08-2010   CYSTOSCOPY WITH DIRECT VISION INTERNAL URETHROTOMY N/A 04/24/2021   Procedure: CYSTOSCOPY WITH DIRECT VISION INTERNAL URETHROTOMY;  Surgeon: Cleon Gustin, MD;  Location: [facility];  Service: Urology;  Laterality: N/A;   FULGURATION OF LESION  04/24/2021   Procedure: FULGURATION OF BLADDER NECK;  Surgeon: Cleon Gustin, MD;  Location: [facility];  Service: Urology;;   MITOMYCIN C INJECTION  04/24/2021   Procedure: MITOMYCIN C INJECTION ANTERIOR BLADDER WALL;  Surgeon: Cleon Gustin, MD;  Location: [facility];  Service: Urology;;   PROSTATECTOMY  2004   RADICAL   TONSILLECTOMY  AS CHILD   TRANSURETHRAL INCISION AND RESECTION OF BLADDER NECK CONTRACTURE  [id number]   UMBILICAL HERNIA REPAIR      His Family History Is Significant For: Family History  Problem Relation Age of Onset    Heart attack Mother    Heart attack Father     His Social History Is Significant For: Social History   Socioeconomic History   Marital status: Married    Spouse name: Arville Go   Number of children: Not on file   Years of education: Not on file   Highest education level: Not on file  Occupational History   Not on file  Tobacco Use   Smoking status: Never   Smokeless tobacco: Never  Substance and Sexual Activity   Alcohol use: Yes    Alcohol/week: 6.0 standard drinks    Types: 6 Cans of beer per week   Drug use: No   Sexual activity: Not on file  Other Topics Concern   Not on file  Social History Narrative   Not on file   Social Determinants of Health   Financial Resource Strain: Not on file  Food Insecurity: Not on file  Transportation Needs: Not on file  Physical Activity: Not on file  Stress: Not on file  Social Connections: Not on file    His Allergies Are:  Allergies  Allergen Reactions   Sulfa Antibiotics Itching and Rash  :   His Current Medications Are:  Outpatient Encounter Medications as of 07/21/2021  Medication Sig   amLODipine-benazepril (LOTREL) 5-20 MG per capsule Take 1 capsule by mouth every morning.   aspirin EC 81 MG tablet Take 81 mg by mouth daily. Swallow whole.   empagliflozin (JARDIANCE) 25 MG TABS tablet Take 25 mg by mouth daily.   gabapentin (NEURONTIN) 100 MG capsule Take 200 mg by mouth at bedtime.   metoprolol tartrate (LOPRESSOR) 25 MG tablet Take 25 mg by mouth 2 (two) times daily.   Multiple Vitamins-Minerals (CENTRUM SILVER ADULT 50+ PO) Take 1 tablet by mouth daily.   omeprazole (PRILOSEC) 20 MG capsule Take 20 mg by mouth every morning.   oxybutynin (DITROPAN-XL) 10 MG 24 hr tablet Take 10 mg by mouth daily.   OZEMPIC, 0.25 OR 0.5 MG/DOSE, 2 MG/1.5ML SOPN Inject 0.5 mg into the skin every Sunday.   rosuvastatin (CRESTOR) 10 MG tablet Take 10 mg by mouth at bedtime.   Vibegron (GEMTESA) 75 MG TABS Take 1 capsule by mouth daily.    No facility-administered encounter medications on file as of 07/21/2021.  :   Review of Systems:  Out of a complete 14 point review of systems, all are reviewed and negative with the exception of these symptoms as listed below:  Review of Systems  Neurological:        Pt here discuss hand tremors, pt is right handed, states it started  a year ago, has recently gotten worse, unable to hold drinks drinks   Objective:  Neurological Exam  Physical Exam Physical Examination:   Vitals:   07/21/21 1021  BP: (!) 156/84  Pulse: 61   General Examination: The patient is a very pleasant  75 y.o. male in no acute distress. He appears well-developed and well-nourished and well groomed.   HEENT: Normocephalic, atraumatic, pupils are equal, round and reactive to light, extraocular tracking is well-preserved, face is symmetric with normal facial animation and normal sensation.  Speech without dysarthria, hypophonia or voice tremor, hearing is grossly intact.  Airway examination reveals mild to moderate mouth dryness.  Tongue protrudes centrally and palate elevates symmetrically.  No carotid bruits.  Chest: Clear to auscultation without wheezing, rhonchi or crackles noted.  Heart: S1+S2+0, regular and normal without murmurs, rubs or gallops noted.   Abdomen: Soft, non-tender and non-distended with normal bowel sounds appreciated on auscultation.  Extremities: There is 1+ pitting edema in the distal lower extremities bilaterally.  He has an indwelling catheter with bag attached to the right leg.  Skin: Warm and dry without trophic changes noted.  Musculoskeletal: exam reveals no obvious joint deformities, tenderness or joint swelling or erythema.   Neurologically:  Mental status: The patient is awake, alert and oriented in all 4 spheres. His immediate and remote memory, attention, language skills and fund of knowledge are appropriate. There is no evidence of aphasia, agnosia, apraxia or anomia.  Speech is clear with normal prosody and enunciation. Thought process is linear. Mood is normal and affect is normal.  Cranial nerves II - XII are as described above under HEENT exam. In addition: shoulder shrug is normal with equal shoulder height noted. Motor exam: Normal bulk, strength and tone is noted. There is no drift, or rebound.  On 07/21/2021:  on Archimedes spiral drawing he has mild trembling with both upper extremities, handwriting is legible, not particularly tremulous and not micrographic.  He has a mild bilateral upper extremity postural and action tremor, no significant intention tremor, no resting tremor, no lower extremity tremor. Romberg is negative. Reflexes are 1+ in the upper extremities and trace in the lower extremities. Toes are flexor bilaterally. Fine motor skills and coordination: intact with normal finger taps, normal hand movements, normal rapid alternating patting, normal foot taps and normal foot agility.  Cerebellar testing: No dysmetria or intention tremor on finger to nose testing. Heel to shin is unremarkable bilaterally. There is no truncal or gait ataxia.  Sensory exam: intact to light touch in the upper and lower extremities.  Gait, station and balance: He stands without difficulty.  Posture is age-appropriate.  He walks with preserved arm swing, no shuffling noted.  Assessment and Plan:    In summary, AHMARE MORALAS is a very pleasant 75 y.o.-year old male with an underlying medical history of hypertension, diabetes, hyperlipidemia, pressure sore, reflux disease, history of gastric ulcer, history of kidney stones, prostate cancer, and obesity, who presents for evaluation of his bilateral upper extremity tremors of approximately 1+ years duration with mild progression reported.  History and examination are supportive of mild essential tremor.  He does not have any signs of parkinsonism and is largely reassured in that regard today.  We talked about tremor disorders  and triggers as well as alleviating factors.  He is advised to continue to pursue a healthy lifestyle, good nutrition, good hydration with water and limit his caffeine intake.  Furthermore, we talked about symptomatic medication options including the use utilization of a beta-blocker.  He is already on metoprolol, low-dose but his pulse is on the lower end of normal spectrum.  I would not recommend adding a second beta-blocker or increasing his metoprolol at this time.  We talked about using Mysoline for symptomatic treatment.  We talked about this medication, its possible common side effects and limitations.  He is agreeable to trying a small dose of 50 mg strength half a pill at bedtime for now.  After 2 weeks he can increase this if tolerated to 1 pill at bedtime.  He was advised that it can be sedating and it can affect his balance so we are going to be cautious with increasing this.  He is advised to follow-up to see one of our nurse practitioners in about 3 to 4 months in this clinic.  He is encouraged to call or email [place] through [place] with any interim questions or concerns.  We will check a chemistry panel and TSH today and call him with the results.  He was provided a new prescription and written instructions.  I answered all the questions today and the patient and his daughter were in agreement with the plan.  Thank you very much for allowing me to participate in the care of this nice patient. If I can be of any further assistance to you please do not hesitate to call me at [phone].  Sincerely,   Star Age, MD, PhD

## 2021-07-21 NOTE — Patient Instructions (Signed)
You have a tremor of both hands, findings are on the milder side today but I do understand that your symptoms fluctuate.  You likely have what we call essential tremor. I do not see any signs or symptoms of parkinson's like disease or what we call parkinsonism.   For your tremor, for symptomatic control, we can try a medication called Mysoline (primidone) 50 mg strength: Take 1/2 pill each bedtime for 2 weeks, then 1 pill each bedtime thereafter. Common side effects reported are: Sleepiness, drowsiness, balance problems, confusion, and GI related symptoms.   We will also do some blood work today, a Multimedia programmer and thyroid screening test called TSH.  We will call you with the results.  I recommend that you have your vitamin D and vitamin B12 level checked with your primary care at the next blood draw.  Please remember, that any kind of tremor may be exacerbated by anxiety, anger, nervousness, excitement, dehydration, sleep deprivation, by caffeine, and low blood sugar values or blood sugar fluctuations.   As discussed, you are already on a medication called metoprolol which is a beta-blocker.  We do utilize a beta-blocker for tremor control as well but since your heart rate is on the lower end of normal, I would not recommend increasing your beta-blocker at this time.  Please follow-up routinely to see one of our nurse practitioners in 3 to 4 months.

## 2021-07-22 LAB — COMPREHENSIVE METABOLIC PANEL
ALT: 19 IU/L (ref 0–44)
AST: 21 IU/L (ref 0–40)
Albumin/Globulin Ratio: 2 (ref 1.2–2.2)
Albumin: 4.8 g/dL — ABNORMAL HIGH (ref 3.7–4.7)
Alkaline Phosphatase: 62 IU/L (ref 44–121)
BUN/Creatinine Ratio: 15 (ref 10–24)
BUN: 15 mg/dL (ref 8–27)
Bilirubin Total: 0.5 mg/dL (ref 0.0–1.2)
CO2: 25 mmol/L (ref 20–29)
Calcium: 9.8 mg/dL (ref 8.6–10.2)
Chloride: 102 mmol/L (ref 96–106)
Creatinine, Ser: 0.97 mg/dL (ref 0.76–1.27)
Globulin, Total: 2.4 g/dL (ref 1.5–4.5)
Glucose: 117 mg/dL — ABNORMAL HIGH (ref 65–99)
Potassium: 6.2 mmol/L — ABNORMAL HIGH (ref 3.5–5.2)
Sodium: 143 mmol/L (ref 134–144)
Total Protein: 7.2 g/dL (ref 6.0–8.5)
eGFR: 80 mL/min/{1.73_m2} (ref >59)

## 2021-07-22 LAB — TSH: TSH: 1.72 u[IU]/mL (ref 0.450–4.500)

## 2021-07-23 ENCOUNTER — Telehealth: Payer: Self-pay | Admitting: *Deleted

## 2021-07-23 NOTE — Telephone Encounter (Signed)
I spoke to the patient's wife on DPR. She verbalized understanding of the findings. States the patient would prefer to follow up with his PCP since they live in [place]. His lab results have been faxed and confirmed to Dr. Stana Bunting at [facility]. She will contact them today for repeat labs and further instructions.

## 2021-07-23 NOTE — Telephone Encounter (Signed)
-----   Message from Star Age, MD sent at 07/22/2021  6:08 PM EDT ----- Thyroid screening test was normal.  Kidney function was okay, slightly elevated blood sugar level at 117.  However, potassium level was quite elevated at 6.2.  This could be an error.  I recommend he either get it rechecked with [place], we could order a BMP which is a basic metabolic profile for repeat check or he can also get it done at his primary care office if he prefers.  We can send the test results to his primary care office for reference.  Please let me know if he wants to get it rechecked with our office and place an order for a BMP if needed.

## 2021-07-24 ENCOUNTER — Other Ambulatory Visit (INDEPENDENT_AMBULATORY_CARE_PROVIDER_SITE_OTHER): Payer: Self-pay

## 2021-07-24 ENCOUNTER — Other Ambulatory Visit: Payer: Self-pay | Admitting: *Deleted

## 2021-07-24 DIAGNOSIS — G250 Essential tremor: Secondary | ICD-10-CM

## 2021-07-24 DIAGNOSIS — Z0289 Encounter for other administrative examinations: Secondary | ICD-10-CM

## 2021-07-24 NOTE — Telephone Encounter (Signed)
[facility] tech, [place] stated pt here for repeat labs BMP.  Order placed.

## 2021-07-25 LAB — BASIC METABOLIC PANEL
BUN/Creatinine Ratio: 19 (ref 10–24)
BUN: 15 mg/dL (ref 8–27)
CO2: 23 mmol/L (ref 20–29)
Calcium: 9.3 mg/dL (ref 8.6–10.2)
Chloride: 98 mmol/L (ref 96–106)
Creatinine, Ser: 0.81 mg/dL (ref 0.76–1.27)
Glucose: 122 mg/dL — ABNORMAL HIGH (ref 65–99)
Potassium: 4.6 mmol/L (ref 3.5–5.2)
Sodium: 139 mmol/L (ref 134–144)
eGFR: 91 mL/min/{1.73_m2} (ref >59)

## 2021-07-28 ENCOUNTER — Telehealth: Payer: Self-pay | Admitting: *Deleted

## 2021-07-28 NOTE — Telephone Encounter (Signed)
-----   Message from Star Age, MD sent at 07/28/2021  4:42 PM EDT ----- Please call patient and advise him that his repeat labs showed a normal potassium level.

## 2021-07-28 NOTE — Telephone Encounter (Signed)
Spoke with patient and let him know his labs showed a normal potassium.  He verbalized understanding and appreciation for the call.

## 2021-07-30 DIAGNOSIS — E7849 Other hyperlipidemia: Secondary | ICD-10-CM | POA: Diagnosis not present

## 2021-07-30 DIAGNOSIS — E1140 Type 2 diabetes mellitus with diabetic neuropathy, unspecified: Secondary | ICD-10-CM | POA: Diagnosis not present

## 2021-07-30 DIAGNOSIS — G250 Essential tremor: Secondary | ICD-10-CM | POA: Diagnosis not present

## 2021-07-30 DIAGNOSIS — Z6838 Body mass index (BMI) 38.0-38.9, adult: Secondary | ICD-10-CM | POA: Diagnosis not present

## 2021-07-30 DIAGNOSIS — I10 Essential (primary) hypertension: Secondary | ICD-10-CM | POA: Diagnosis not present

## 2021-07-30 DIAGNOSIS — E1169 Type 2 diabetes mellitus with other specified complication: Secondary | ICD-10-CM | POA: Diagnosis not present

## 2021-08-20 DIAGNOSIS — E1140 Type 2 diabetes mellitus with diabetic neuropathy, unspecified: Secondary | ICD-10-CM | POA: Diagnosis not present

## 2021-08-20 DIAGNOSIS — I10 Essential (primary) hypertension: Secondary | ICD-10-CM | POA: Diagnosis not present

## 2021-08-20 DIAGNOSIS — E7849 Other hyperlipidemia: Secondary | ICD-10-CM | POA: Diagnosis not present

## 2021-08-20 DIAGNOSIS — Z7984 Long term (current) use of oral hypoglycemic drugs: Secondary | ICD-10-CM | POA: Diagnosis not present

## 2021-09-08 ENCOUNTER — Other Ambulatory Visit: Payer: Self-pay | Admitting: Urology

## 2021-09-15 ENCOUNTER — Encounter: Payer: Self-pay | Admitting: Urology

## 2021-09-15 ENCOUNTER — Other Ambulatory Visit: Payer: Self-pay

## 2021-09-15 ENCOUNTER — Ambulatory Visit (INDEPENDENT_AMBULATORY_CARE_PROVIDER_SITE_OTHER): Payer: Medicare Other | Admitting: Urology

## 2021-09-15 VITALS — BP 120/72 | HR 65

## 2021-09-15 DIAGNOSIS — R339 Retention of urine, unspecified: Secondary | ICD-10-CM | POA: Diagnosis not present

## 2021-09-15 DIAGNOSIS — N3021 Other chronic cystitis with hematuria: Secondary | ICD-10-CM

## 2021-09-15 MED ORDER — GEMTESA 75 MG PO TABS: 1.0000 | ORAL_TABLET | Freq: Every day | ORAL | 11 refills | Status: DC

## 2021-09-15 NOTE — Patient Instructions (Signed)
Urinary Tract Infection, Adult A urinary tract infection (UTI) is an infection of any part of the urinary tract. The urinary tract includes the kidneys, ureters, bladder, and urethra. These organs make, store, and get rid of urine in the body. An upper UTI affects the ureters and kidneys. A lower UTI affects the bladder and urethra. What are the causes? Most urinary tract infections are caused by bacteria in your genital area around your urethra, where urine leaves your body. These bacteria grow and cause inflammation of your urinary tract. What increases the risk? You are more likely to develop this condition if: You have a urinary catheter that stays in place. You are not able to control when you urinate or have a bowel movement (incontinence). You are male and you: Use a spermicide or diaphragm for birth control. Have low estrogen levels. Are pregnant. You have certain genes that increase your risk. You are sexually active. You take antibiotic medicines. You have a condition that causes your flow of urine to slow down, such as: An enlarged prostate, if you are male. Blockage in your urethra. A kidney stone. A nerve condition that affects your bladder control (neurogenic bladder). Not getting enough to drink, or not urinating often. You have certain medical conditions, such as: Diabetes. A weak disease-fighting system (immunesystem). Sickle cell disease. Gout. Spinal cord injury. What are the signs or symptoms? Symptoms of this condition include: Needing to urinate right away (urgency). Frequent urination. This may include small amounts of urine each time you urinate. Pain or burning with urination. Blood in the urine. Urine that smells bad or unusual. Trouble urinating. Cloudy urine. Vaginal discharge, if you are male. Pain in the abdomen or the lower back. You may also have: Vomiting or a decreased appetite. Confusion. Irritability or tiredness. A fever or  chills. Diarrhea. The first symptom in older adults may be confusion. In some cases, they may not have any symptoms until the infection has worsened. How is this diagnosed? This condition is diagnosed based on your medical history and a physical exam. You may also have other tests, including: Urine tests. Blood tests. Tests for STIs (sexually transmitted infections). If you have had more than one UTI, a cystoscopy or imaging studies may be done to determine the cause of the infections. How is this treated? Treatment for this condition includes: Antibiotic medicine. Over-the-counter medicines to treat discomfort. Drinking enough water to stay hydrated. If you have frequent infections or have other conditions such as a kidney stone, you may need to see a health care provider who specializes in the urinary tract (urologist). In rare cases, urinary tract infections can cause sepsis. Sepsis is a life-threatening condition that occurs when the body responds to an infection. Sepsis is treated in the hospital with IV antibiotics, fluids, and other medicines. Follow these instructions at home: Medicines Take over-the-counter and prescription medicines only as told by your health care provider. If you were prescribed an antibiotic medicine, take it as told by your health care provider. Do not stop using the antibiotic even if you start to feel better. General instructions Make sure you: Empty your bladder often and completely. Do not hold urine for long periods of time. Empty your bladder after sex. Wipe from front to back after urinating or having a bowel movement if you are male. Use each tissue only one time when you wipe. Drink enough fluid to keep your urine pale yellow. Keep all follow-up visits. This is important. Contact a health care provider   if: Your symptoms do not get better after 1-2 days. Your symptoms go away and then return. Get help right away if: You have severe pain in your  back or your lower abdomen. You have a fever or chills. You have nausea or vomiting. Summary A urinary tract infection (UTI) is an infection of any part of the urinary tract, which includes the kidneys, ureters, bladder, and urethra. Most urinary tract infections are caused by bacteria in your genital area. Treatment for this condition often includes antibiotic medicines. If you were prescribed an antibiotic medicine, take it as told by your health care provider. Do not stop using the antibiotic even if you start to feel better. Keep all follow-up visits. This is important. This information is not intended to replace advice given to you by your health care provider. Make sure you discuss any questions you have with your health care provider. Document Revised: 07/19/2020 Document Reviewed: 07/19/2020 Elsevier Patient Education  2022 Elsevier Inc.  

## 2021-09-15 NOTE — Progress Notes (Signed)
09/15/2021 2:49 PM   Marc Burns Oct 20, 1946 [id number]  Referring provider: Practice, [facility] [place],  [place] [address]  Followup urinary retention and recurrent UTI   HPI: Marc Burns is a 75yo here for followup for recurrent UTI and urinary retention. He has had 1 UTI since last visit. He has urinary incontinence around the foley catheter. He has unaware urinary continence. He is on gemtesa 75mg  daily.  He is changing the foley catheter every 2-3 weeks. No other complaints    PMH: Past Medical History:  Diagnosis Date   Arthritis    Bladder neck contracture    Complication of anesthesia    Difficult intubation   Difficult intubation    GERD (gastroesophageal reflux disease)    History of gastric ulcer    2007   History of kidney stones    History of prostate cancer    2004--  S/P PROSTATECTOMY   Hypertension    Urinary incontinence    Wears glasses     Surgical History: Past Surgical History:  Procedure Laterality Date   BALLOON DILATION N/A 09/12/2013   Procedure: CYSTO BALLOON DILATION/RETROGRADE URETHROGRAM;  Surgeon: Marc Packer, MD;  Location: [facility];  Service: Urology;  Laterality: N/A;   CYSTO/  URETHRAL DILATION/ BLADDER BX/ BILATERAL RETROGRADE PYELOGRAM  03-03-2010   CYSTO/ BALLOON DILATION AND VAPORIZATION BLADDER NECK & PROSTATIC FOSSA  04-28-2010   CYSTO/ BALLOON DILATION BLADDER NECK CONTRACTURE  07-23-2011;   11-17-2010;  08-08-2010   CYSTOSCOPY WITH DIRECT VISION INTERNAL URETHROTOMY N/A 04/24/2021   Procedure: CYSTOSCOPY WITH DIRECT VISION INTERNAL URETHROTOMY;  Surgeon: Marc Gustin, MD;  Location: [facility];  Service: Urology;  Laterality: N/A;   FULGURATION OF LESION  04/24/2021   Procedure: FULGURATION OF BLADDER NECK;  Surgeon: Marc Gustin, MD;  Location: [facility];  Service: Urology;;   MITOMYCIN C INJECTION  04/24/2021   Procedure: MITOMYCIN C INJECTION ANTERIOR BLADDER WALL;  Surgeon: Marc Gustin, MD;  Location: [facility];  Service: Urology;;   PROSTATECTOMY  2004   RADICAL   TONSILLECTOMY  AS CHILD   TRANSURETHRAL INCISION AND RESECTION OF BLADDER NECK CONTRACTURE  [id number]   UMBILICAL HERNIA REPAIR      Home Medications:  Allergies as of 09/15/2021       Reactions   Sulfa Antibiotics Itching, Rash        Medication List        Accurate as of September 15, 2021  2:49 PM. If you have any questions, ask your nurse or doctor.          amLODipine-benazepril 5-20 MG capsule Commonly known as: LOTREL Take 1 capsule by mouth every morning.   aspirin EC 81 MG tablet Take 81 mg by mouth daily. Swallow whole.   CENTRUM SILVER ADULT 50+ PO Take 1 tablet by mouth daily.   ciprofloxacin 250 MG tablet Commonly known as: CIPRO Take 1 tablet by mouth twice daily   empagliflozin 25 MG Tabs tablet Commonly known as: JARDIANCE Take 25 mg by mouth daily.   furosemide 20 MG tablet Commonly known as: LASIX   gabapentin 100 MG capsule Commonly known as: NEURONTIN Take 200 mg by mouth at bedtime.   Gemtesa 75 MG Tabs Generic drug: Vibegron Take 1 capsule by mouth daily.   metoprolol tartrate 25 MG tablet Commonly known as: LOPRESSOR Take 25 mg by mouth 2 (two) times daily.   omeprazole 20 MG capsule Commonly known as:  PRILOSEC Take 20 mg by mouth every morning.   oxybutynin 10 MG 24 hr tablet Commonly known as: DITROPAN-XL Take 10 mg by mouth daily.   Ozempic (0.25 or 0.5 MG/DOSE) 2 MG/1.5ML Sopn Generic drug: Semaglutide(0.25 or 0.5MG /DOS) Inject 0.5 mg into the skin every Sunday.   primidone 50 MG tablet Commonly known as: Mysoline Take 1 tablet (50 mg total) by mouth at bedtime. Follow titration instructions provided separately in writing.   rosuvastatin 10 MG tablet Commonly known as: CRESTOR Take 10 mg by mouth at bedtime.        Allergies:  Allergies  Allergen Reactions   Sulfa Antibiotics Itching and Rash    Family  History: Family History  Problem Relation Age of Onset   Heart attack Mother    Heart attack Father     Social History:  reports that he has never smoked. He has never used smokeless tobacco. He reports current alcohol use of about 6.0 standard drinks per week. He reports that he does not use drugs.  ROS: All other review of systems were reviewed and are negative except what is noted above in HPI  Physical Exam: BP 120/72   Pulse 65   Constitutional:  Alert and oriented, No acute distress. HEENT: [place] AT, moist mucus membranes.  Trachea midline, no masses. Cardiovascular: No clubbing, cyanosis, or edema. Respiratory: Normal respiratory effort, no increased work of breathing. GI: Abdomen is soft, nontender, nondistended, no abdominal masses GU: No CVA tenderness.  Lymph: No cervical or inguinal lymphadenopathy. Skin: No rashes, bruises or suspicious lesions. Neurologic: Grossly intact, no focal deficits, moving all 4 extremities. Psychiatric: Normal mood and affect.  Laboratory Data: Lab Results  Component Value Date   WBC 9.4 11/17/2010   HGB 15.0 09/12/2013   HCT 44.0 09/12/2013   MCV 87.8 11/17/2010   PLT 138 (L) 11/17/2010    Lab Results  Component Value Date   CREATININE 0.81 07/24/2021    No results found for: PSA  No results found for: TESTOSTERONE  Lab Results  Component Value Date   HGBA1C 7.2 (H) 04/22/2021    Urinalysis    Component Value Date/Time   COLORURINE YELLOW 02/26/2010 0930   APPEARANCEUR CLOUDY (A) 02/26/2010 0930   LABSPEC 1.019 02/26/2010 0930   PHURINE 5.5 02/26/2010 0930   GLUCOSEU NEGATIVE 02/26/2010 0930   HGBUR LARGE (A) 02/26/2010 0930   BILIRUBINUR NEGATIVE 02/26/2010 0930   KETONESUR NEGATIVE 02/26/2010 0930   PROTEINUR 30 (A) 02/26/2010 0930   UROBILINOGEN 0.2 02/26/2010 0930   NITRITE NEGATIVE 02/26/2010 0930   LEUKOCYTESUR SMALL (A) 02/26/2010 0930    Lab Results  Component Value Date   BACTERIA FEW (A) 02/26/2010     Pertinent Imaging:  No results found for this or any previous visit.  No results found for this or any previous visit.  No results found for this or any previous visit.  No results found for this or any previous visit.  Results for orders placed during the hospital encounter of 07/20/17  US Renal  Narrative CLINICAL DATA:  75 year old male with urinary retention. Self catheterization. Initial encounter.  EXAM: RENAL / URINARY TRACT ULTRASOUND COMPLETE  COMPARISON:  None.  FINDINGS: Right Kidney:  Length: 11.3 cm. Echogenicity within normal limits. No mass or hydronephrosis visualized.  Left Kidney:  Length: 11.8 cm. Echogenicity within normal limits. No mass or hydronephrosis visualized.  Bladder:  Decompressed and not adequately assessed  IMPRESSION: Kidneys unremarkable  Bladder decompressed and not assessed   Electronically  Signed By: Genia Del M.D. On: 07/20/2017 12:25  No results found for this or any previous visit.  No results found for this or any previous visit.  No results found for this or any previous visit.   Assessment & Plan:    1. Urinary retention -We will increase foley size to 20 [place] -If he continue to leak around foley we will likely pursue intravesical botox injection  2. Chronic cystitis with hematuria -self start cipro.    No follow-ups on file.  Nicolette Bang, MD  [facility] [place]

## 2021-09-15 NOTE — Progress Notes (Signed)
Urological Symptom Review  Patient is experiencing the following symptoms: Kidney stones   Review of Systems  Gastrointestinal (upper)  : Negative for upper GI symptoms  Gastrointestinal (lower) : Diarrhea  Constitutional : Negative for symptoms  Skin: Negative for skin symptoms  Eyes: Negative for eye symptoms  Ear/Nose/Throat : Sinus problems  Hematologic/Lymphatic: Negative for Hematologic/Lymphatic symptoms  Cardiovascular : Leg swelling  Respiratory : Negative for respiratory symptoms  Endocrine: Excessive thirst  Musculoskeletal: Back pain  Neurological: Negative for neurological symptoms  Psychologic: Negative for psychiatric symptoms

## 2021-09-23 DIAGNOSIS — Z20828 Contact with and (suspected) exposure to other viral communicable diseases: Secondary | ICD-10-CM | POA: Diagnosis not present

## 2021-09-23 DIAGNOSIS — Z23 Encounter for immunization: Secondary | ICD-10-CM | POA: Diagnosis not present

## 2021-10-16 DIAGNOSIS — Z23 Encounter for immunization: Secondary | ICD-10-CM | POA: Diagnosis not present

## 2021-10-17 ENCOUNTER — Other Ambulatory Visit: Payer: Self-pay

## 2021-10-17 ENCOUNTER — Encounter: Payer: Self-pay | Admitting: Urology

## 2021-10-17 ENCOUNTER — Ambulatory Visit (INDEPENDENT_AMBULATORY_CARE_PROVIDER_SITE_OTHER): Payer: Medicare Other | Admitting: Urology

## 2021-10-17 VITALS — BP 149/84 | HR 91

## 2021-10-17 DIAGNOSIS — N3942 Incontinence without sensory awareness: Secondary | ICD-10-CM | POA: Diagnosis not present

## 2021-10-17 NOTE — Progress Notes (Signed)
Patient presents today with "leaking around catheter tube" patient reports he attempted to upsize to a 72F catheter to help reduce leakage but that didn't change the leakage.  Patient has 67F back in today per pt. Reports increase of leakage over last several days. Urine still draining to bag but also around catheter.

## 2021-10-17 NOTE — Patient Instructions (Signed)

## 2021-10-17 NOTE — Progress Notes (Signed)
10/17/2021 10:56 AM   Marc Burns 1946-08-23 [id number]  Referring provider: Practice, [facility] [place],  [place] [address]  Urinary incontinence   HPI: Marc Burns is a 75yo here for followup for urinary incontinence. He has failed multiple anticholinergics and Beta3s and continues to have urinary incontinence. He uses 5-6 pads per day. He has an indwelling foley due to hx of bladder neck contracture. No other complaints today.    PMH: Past Medical History:  Diagnosis Date   Arthritis    Bladder neck contracture    Complication of anesthesia    Difficult intubation   Difficult intubation    GERD (gastroesophageal reflux disease)    History of gastric ulcer    2007   History of kidney stones    History of prostate cancer    2004--  S/P PROSTATECTOMY   Hypertension    Urinary incontinence    Wears glasses     Surgical History: Past Surgical History:  Procedure Laterality Date   BALLOON DILATION N/A 09/12/2013   Procedure: CYSTO BALLOON DILATION/RETROGRADE URETHROGRAM;  Surgeon: Reece Packer, MD;  Location: [facility];  Service: Urology;  Laterality: N/A;   CYSTO/  URETHRAL DILATION/ BLADDER BX/ BILATERAL RETROGRADE PYELOGRAM  03-03-2010   CYSTO/ BALLOON DILATION AND VAPORIZATION BLADDER NECK & PROSTATIC FOSSA  04-28-2010   CYSTO/ BALLOON DILATION BLADDER NECK CONTRACTURE  07-23-2011;   11-17-2010;  08-08-2010   CYSTOSCOPY WITH DIRECT VISION INTERNAL URETHROTOMY N/A 04/24/2021   Procedure: CYSTOSCOPY WITH DIRECT VISION INTERNAL URETHROTOMY;  Surgeon: Cleon Gustin, MD;  Location: [facility];  Service: Urology;  Laterality: N/A;   FULGURATION OF LESION  04/24/2021   Procedure: FULGURATION OF BLADDER NECK;  Surgeon: Cleon Gustin, MD;  Location: [facility];  Service: Urology;;   MITOMYCIN C INJECTION  04/24/2021   Procedure: MITOMYCIN C INJECTION ANTERIOR BLADDER WALL;  Surgeon: Cleon Gustin, MD;  Location: [facility];  Service:  Urology;;   PROSTATECTOMY  2004   RADICAL   TONSILLECTOMY  AS CHILD   TRANSURETHRAL INCISION AND RESECTION OF BLADDER NECK CONTRACTURE  [id number]   UMBILICAL HERNIA REPAIR      Home Medications:  Allergies as of 10/17/2021       Reactions   Sulfa Antibiotics Itching, Rash        Medication List        Accurate as of October 17, 2021 10:56 AM. If you have any questions, ask your nurse or doctor.          amLODipine-benazepril 5-20 MG capsule Commonly known as: LOTREL Take 1 capsule by mouth every morning.   aspirin EC 81 MG tablet Take 81 mg by mouth daily. Swallow whole.   CENTRUM SILVER ADULT 50+ PO Take 1 tablet by mouth daily.   ciprofloxacin 250 MG tablet Commonly known as: CIPRO Take 1 tablet by mouth twice daily   empagliflozin 25 MG Tabs tablet Commonly known as: JARDIANCE Take 25 mg by mouth daily.   furosemide 20 MG tablet Commonly known as: LASIX   gabapentin 100 MG capsule Commonly known as: NEURONTIN Take 200 mg by mouth at bedtime.   Gemtesa 75 MG Tabs Generic drug: Vibegron Take 1 capsule by mouth daily.   metoprolol tartrate 25 MG tablet Commonly known as: LOPRESSOR Take 25 mg by mouth 2 (two) times daily.   omeprazole 20 MG capsule Commonly known as: PRILOSEC Take 20 mg by mouth every morning.   Ozempic (0.25 or  0.5 MG/DOSE) 2 MG/1.5ML Sopn Generic drug: Semaglutide(0.25 or 0.5MG /DOS) Inject 0.5 mg into the skin every Sunday.   primidone 50 MG tablet Commonly known as: Mysoline Take 1 tablet (50 mg total) by mouth at bedtime. Follow titration instructions provided separately in writing.   rosuvastatin 10 MG tablet Commonly known as: CRESTOR Take 10 mg by mouth at bedtime.        Allergies:  Allergies  Allergen Reactions   Sulfa Antibiotics Itching and Rash    Family History: Family History  Problem Relation Age of Onset   Heart attack Mother    Heart attack Father     Social History:  reports that he has  never smoked. He has never used smokeless tobacco. He reports current alcohol use of about 6.0 standard drinks per week. He reports that he does not use drugs.  ROS: All other review of systems were reviewed and are negative except what is noted above in HPI  Physical Exam: BP (!) 149/84   Pulse 91   Constitutional:  Alert and oriented, No acute distress. HEENT: [place] AT, moist mucus membranes.  Trachea midline, no masses. Cardiovascular: No clubbing, cyanosis, or edema. Respiratory: Normal respiratory effort, no increased work of breathing. GI: Abdomen is soft, nontender, nondistended, no abdominal masses GU: No CVA tenderness.  Lymph: No cervical or inguinal lymphadenopathy. Skin: No rashes, bruises or suspicious lesions. Neurologic: Grossly intact, no focal deficits, moving all 4 extremities. Psychiatric: Normal mood and affect.  Laboratory Data: Lab Results  Component Value Date   WBC 9.4 11/17/2010   HGB 15.0 09/12/2013   HCT 44.0 09/12/2013   MCV 87.8 11/17/2010   PLT 138 (L) 11/17/2010    Lab Results  Component Value Date   CREATININE 0.81 07/24/2021    No results found for: PSA  No results found for: TESTOSTERONE  Lab Results  Component Value Date   HGBA1C 7.2 (H) 04/22/2021    Urinalysis    Component Value Date/Time   COLORURINE YELLOW 02/26/2010 0930   APPEARANCEUR CLOUDY (A) 02/26/2010 0930   LABSPEC 1.019 02/26/2010 0930   PHURINE 5.5 02/26/2010 0930   GLUCOSEU NEGATIVE 02/26/2010 0930   HGBUR LARGE (A) 02/26/2010 0930   BILIRUBINUR NEGATIVE 02/26/2010 0930   KETONESUR NEGATIVE 02/26/2010 0930   PROTEINUR 30 (A) 02/26/2010 0930   UROBILINOGEN 0.2 02/26/2010 0930   NITRITE NEGATIVE 02/26/2010 0930   LEUKOCYTESUR SMALL (A) 02/26/2010 0930    Lab Results  Component Value Date   BACTERIA FEW (A) 02/26/2010    Pertinent Imaging:  No results found for this or any previous visit.  No results found for this or any previous visit.  No results  found for this or any previous visit.  No results found for this or any previous visit.  Results for orders placed during the hospital encounter of 07/20/17  US Renal  Narrative CLINICAL DATA:  75 year old male with urinary retention. Self catheterization. Initial encounter.  EXAM: RENAL / URINARY TRACT ULTRASOUND COMPLETE  COMPARISON:  None.  FINDINGS: Right Kidney:  Length: 11.3 cm. Echogenicity within normal limits. No mass or hydronephrosis visualized.  Left Kidney:  Length: 11.8 cm. Echogenicity within normal limits. No mass or hydronephrosis visualized.  Bladder:  Decompressed and not adequately assessed  IMPRESSION: Kidneys unremarkable  Bladder decompressed and not assessed   Electronically Signed By: Genia Del M.D. On: 07/20/2017 12:25  No results found for this or any previous visit.  No results found for this or any previous visit.  No results found for this or any previous visit.   Assessment & Plan:    1. Urinary incontinence without sensory awareness -We will schedule for urodynamics   No follow-ups on file.  Nicolette Bang, MD  [facility] [place]

## 2021-10-28 ENCOUNTER — Other Ambulatory Visit: Payer: Self-pay | Admitting: Urology

## 2021-11-03 DIAGNOSIS — N3941 Urge incontinence: Secondary | ICD-10-CM | POA: Diagnosis not present

## 2021-11-11 ENCOUNTER — Other Ambulatory Visit: Payer: Self-pay | Admitting: Urology

## 2021-11-14 NOTE — Telephone Encounter (Signed)
I don't see why this was written. Does he need a refill? Looks like he has catheter in and needs urodynamics. I received request from pharmacy.

## 2021-11-17 ENCOUNTER — Ambulatory Visit: Payer: Medicare Other | Admitting: Urology

## 2021-11-17 ENCOUNTER — Other Ambulatory Visit: Payer: Self-pay

## 2021-11-17 DIAGNOSIS — N3021 Other chronic cystitis with hematuria: Secondary | ICD-10-CM

## 2021-11-17 MED ORDER — CIPROFLOXACIN HCL 250 MG PO TABS: 250.0000 mg | ORAL_TABLET | Freq: Two times a day (BID) | ORAL | 0 refills | Status: DC

## 2021-11-19 NOTE — Patient Instructions (Signed)
Below is our plan:  We will continue primidone 50mg  at bedtime. We will add 1/2 dose (25mg  or 1/2 tablet) during the day. If you tolerate it well you can increase dose to 50mg  in am and 50mg  in evening.   Please make sure you are staying well hydrated. I recommend 50-60 ounces daily. Well balanced diet and regular exercise encouraged. Consistent sleep schedule with 6-8 hours recommended.   Please continue follow up with care team as directed.   Follow up with me in 6 months   You may receive a survey regarding today's visit. I encourage you to leave honest feed back as I do use this information to improve patient care. Thank you for seeing me today!

## 2021-11-19 NOTE — Progress Notes (Signed)
Chief Complaint  Patient presents with   Follow-up    Pt alone, rm 11. Here for f/u overall stable. Shaking has not worsened since started meds      HISTORY OF PRESENT ILLNESS:  11/20/21 ALL:  Marc Burns is a 75 y.o. male here today for follow up for bilateral hand tremor. He seen in consult with Marc Burns 07/2021. Tremor felt to be more consistent with an essential tremor and he was started on primidone 60m at bedtime. He has tolerated medication well. No obvious adverse effects. He is not certain tremor has improved but does not feel it is worse. He has difficulty hold a plate or cup. It is worse when anxious or tired. No resting tremor noted. He is feeling well today and without concerns.    HISTORY (copied from Marc Burns note)  Dear Marc Burns   I saw your patient, Marc Burns upon your kind request in my neurologic clinic today for initial consultation of his hand tremor.  The patient is accompanied by his daughter, Marc Burns today.  As you know, Marc. Burns a 75year old right-handed gentleman with an underlying medical history of hypertension, diabetes, hyperlipidemia, pressure sore, reflux disease, history of gastric ulcer, history of kidney stones, prostate cancer, and obesity, who reports an approximately 1+ year history of bilateral hand tremors.  He has noted the tremor primarily when he tries to hold a cup or feed himself.  Tremor is bilateral and mostly with holding and doing something with fine motor skills involved, not at rest.  He has not noticed any tremor in the upper body or legs.  He is not aware of any family history of tremors.  He is an only child.  Parents lived to be in their mid to late 770sand both had heart problems.  He has 3 grown children, none with tremors.  There is no obvious trigger or alleviating factor.  He has not had any recent medication changes.  Some days the tremor is hardly noticeable and some other days it is quite significant  and bothersome.  He has been worried about having something like Parkinson's disease.  He limits his caffeine to 2 cups of coffee in the morning but does drink some soda 1 or 2 small bottles per day on average.  He tries to hydrate well.  He has tried to exercise his upper body with small weights, up to 5 pounds but did not notice any improvement in his tremor.  He denies sudden onset of one-sided weakness or numbness or tingling or droopy face or slurring of speech, denies any recurrent headaches. I reviewed your office note from 04/29/2021.  He has been on metoprolol.  He has been on gabapentin for diabetic neuropathy and is currently on 200 mg twice daily.  His last A1c in February 2022 was 7.4.   REVIEW OF SYSTEMS: Out of a complete 14 system review of symptoms, the patient complains only of the following symptoms, tremor and all other reviewed systems are negative.   ALLERGIES: Allergies  Allergen Reactions   Sulfa Antibiotics Itching and Rash     HOME MEDICATIONS: Outpatient Medications Prior to Visit  Medication Sig Dispense Refill   amLODipine-benazepril (LOTREL) 5-20 MG per capsule Take 1 capsule by mouth every morning.     aspirin EC 81 MG tablet Take 81 mg by mouth daily. Swallow whole.     ciprofloxacin (CIPRO) 250 MG tablet Take 1 tablet (250 mg total) by mouth  2 (two) times daily. 10 tablet 0   empagliflozin (JARDIANCE) 25 MG TABS tablet Take 25 mg by mouth daily.     furosemide (LASIX) 20 MG tablet      gabapentin (NEURONTIN) 100 MG capsule Take 200 mg by mouth at bedtime.     metoprolol tartrate (LOPRESSOR) 25 MG tablet Take 25 mg by mouth 2 (two) times daily.     Multiple Vitamins-Minerals (CENTRUM SILVER ADULT 50+ PO) Take 1 tablet by mouth daily.     omeprazole (PRILOSEC) 20 MG capsule Take 20 mg by mouth every morning.     OZEMPIC, 0.25 OR 0.5 MG/DOSE, 2 MG/1.5ML SOPN Inject 0.5 mg into the skin every Sunday.     rosuvastatin (CRESTOR) 10 MG tablet Take 10 mg by mouth at  bedtime.     Vibegron (GEMTESA) 75 MG TABS Take 1 capsule by mouth daily. 30 tablet 11   primidone (MYSOLINE) 50 MG tablet Take 1 tablet (50 mg total) by mouth at bedtime. Follow titration instructions provided separately in writing. 30 tablet 5   No facility-administered medications prior to visit.     PAST MEDICAL HISTORY: Past Medical History:  Diagnosis Date   Arthritis    Bladder neck contracture    Complication of anesthesia    Difficult intubation   Difficult intubation    GERD (gastroesophageal reflux disease)    History of gastric ulcer    2007   History of kidney stones    History of prostate cancer    2004--  S/P PROSTATECTOMY   Hypertension    Urinary incontinence    Wears glasses      PAST SURGICAL HISTORY: Past Surgical History:  Procedure Laterality Date   BALLOON DILATION N/A 09/12/2013   Procedure: CYSTO BALLOON DILATION/RETROGRADE URETHROGRAM;  Surgeon: Reece Packer, MD;  [facility];  Service: Urology;  Laterality: N/A;   CYSTO/  URETHRAL DILATION/ BLADDER BX/ BILATERAL RETROGRADE PYELOGRAM  03-03-2010   CYSTO/ BALLOON DILATION AND VAPORIZATION BLADDER NECK & PROSTATIC FOSSA  04-28-2010   CYSTO/ BALLOON DILATION BLADDER NECK CONTRACTURE  07-23-2011;   11-17-2010;  08-08-2010   CYSTOSCOPY WITH DIRECT VISION INTERNAL URETHROTOMY N/A 04/24/2021   Procedure: CYSTOSCOPY WITH DIRECT VISION INTERNAL URETHROTOMY;  Surgeon: Cleon Gustin, MD;  Location: [facility];  Service: Urology;  Laterality: N/A;   FULGURATION OF LESION  04/24/2021   Procedure: FULGURATION OF BLADDER NECK;  Surgeon: Cleon Gustin, MD;  Location: [facility];  Service: Urology;;   MITOMYCIN C INJECTION  04/24/2021   Procedure: MITOMYCIN C INJECTION ANTERIOR BLADDER WALL;  Surgeon: Cleon Gustin, MD;  Location: [facility];  Service: Urology;;   PROSTATECTOMY  2004   RADICAL   TONSILLECTOMY  AS CHILD   TRANSURETHRAL INCISION AND RESECTION OF BLADDER NECK  CONTRACTURE  [id number]   UMBILICAL HERNIA REPAIR       FAMILY HISTORY: Family History  Problem Relation Age of Onset   Heart attack Mother    Heart attack Father      SOCIAL HISTORY: Social History   Socioeconomic History   Marital status: Married    Spouse name: Arville Go   Number of children: Not on file   Years of education: Not on file   Highest education level: Not on file  Occupational History   Not on file  Tobacco Use   Smoking status: Never   Smokeless tobacco: Never  Substance and Sexual Activity   Alcohol use: Yes    Alcohol/week: 6.0 standard  drinks    Types: 6 Cans of beer per week   Drug use: No   Sexual activity: Not on file  Other Topics Concern   Not on file  Social History Narrative   Not on file   Social Determinants of Health   Financial Resource Strain: Not on file  Food Insecurity: Not on file  Transportation Needs: Not on file  Physical Activity: Not on file  Stress: Not on file  Social Connections: Not on file  Intimate Partner Violence: Not on file     PHYSICAL EXAM  Vitals:   11/20/21 1126  BP: (!) 147/77  Pulse: 69  Weight: 247 lb (112 kg)  Height: '5\' 8"'  (1.727 m)   Body mass index is 37.56 kg/m.  Generalized: Well developed, in no acute distress  Cardiology: normal rate and rhythm, no murmur auscultated  Respiratory: clear to auscultation bilaterally    Neurological examination  Mentation: Alert oriented to time, place, history taking. Follows all commands speech and language fluent Cranial nerve II-XII: Pupils were equal round reactive to light. Extraocular movements were full, visual field were full on confrontational test. Facial sensation and strength were normal. Head turning and shoulder shrug  were normal and symmetric. Motor: The motor testing reveals 5 over 5 strength of all 4 extremities. Good symmetric motor tone is noted throughout. Mild bilateral hand tremor noted with exam.  Gait and station: Gait is normal.     DIAGNOSTIC DATA (LABS, IMAGING, TESTING) - I reviewed patient records, labs, notes, testing and imaging myself where available.  Lab Results  Component Value Date   WBC 9.4 11/17/2010   HGB 15.0 09/12/2013   HCT 44.0 09/12/2013   MCV 87.8 11/17/2010   PLT 138 (L) 11/17/2010      Component Value Date/Time   NA 139 07/24/2021 1042   K 4.6 07/24/2021 1042   CL 98 07/24/2021 1042   CO2 23 07/24/2021 1042   GLUCOSE 122 (H) 07/24/2021 1042   GLUCOSE 127 (H) 04/22/2021 1514   BUN 15 07/24/2021 1042   CREATININE 0.81 07/24/2021 1042   CALCIUM 9.3 07/24/2021 1042   PROT 7.2 07/21/2021 1123   ALBUMIN 4.8 (H) 07/21/2021 1123   AST 21 07/21/2021 1123   ALT 19 07/21/2021 1123   ALKPHOS 62 07/21/2021 1123   BILITOT 0.5 07/21/2021 1123   GFRNONAA >60 04/22/2021 1514   GFRAA  11/17/2010 1403    >60        The eGFR has been calculated using the MDRD equation. This calculation has not been validated in all clinical situations. eGFR's persistently <60 mL/min signify possible Chronic Kidney Disease.   No results found for: CHOL, HDL, LDLCALC, LDLDIRECT, TRIG, CHOLHDL Lab Results  Component Value Date   HGBA1C 7.2 (H) 04/22/2021   No results found for: GURKYHCW23 Lab Results  Component Value Date   TSH 1.720 07/21/2021    No flowsheet data found.   No flowsheet data found.   ASSESSMENT AND PLAN  75 y.o. year old male  has a past medical history of Arthritis, Bladder neck contracture, Complication of anesthesia, Difficult intubation, GERD (gastroesophageal reflux disease), History of gastric ulcer, History of kidney stones, History of prostate cancer, Hypertension, Urinary incontinence, and Wears glasses. here with    Essential tremor - Plan: Primidone level  Yonah has tolerated primidone 4m daily at bedtime without adverse effects. Tremor is not worse. I will check primidone level to ensure tolerability. I will have him try taking 25-582min the  am and he will  continue 15m at bedtime. Possible side effects reviewed. He will call with any concerns. Healthy lifestyle habits encouraged. He will follow up with me in 6 months.    Orders Placed This Encounter  Procedures   Primidone level      Meds ordered this encounter  Medications   primidone (MYSOLINE) 50 MG tablet    Sig: Take 1 tablet (50 mg total) by mouth 2 (two) times daily. Follow titration instructions provided separately in writing.    Dispense:  60 tablet    Refill:  5    Order Specific Question:   Supervising Provider    Answer:   AMelvenia Beam[[[id number]      ADebbora Presto MSN, FNP-C 11/20/2021, 1:38 PM  [facility] [address] [place] [phone]

## 2021-11-20 ENCOUNTER — Ambulatory Visit (INDEPENDENT_AMBULATORY_CARE_PROVIDER_SITE_OTHER): Payer: Medicare Other | Admitting: Family Medicine

## 2021-11-20 ENCOUNTER — Other Ambulatory Visit: Payer: Self-pay

## 2021-11-20 ENCOUNTER — Encounter: Payer: Self-pay | Admitting: Family Medicine

## 2021-11-20 VITALS — BP 147/77 | HR 69 | Ht 68.0 in | Wt 247.0 lb

## 2021-11-20 DIAGNOSIS — G250 Essential tremor: Secondary | ICD-10-CM | POA: Diagnosis not present

## 2021-11-20 MED ORDER — PRIMIDONE 50 MG PO TABS: 50.0000 mg | ORAL_TABLET | Freq: Two times a day (BID) | ORAL | 5 refills | Status: DC

## 2021-11-21 LAB — PRIMIDONE, SERUM
Phenobarbital, Serum: NOT DETECTED ug/mL (ref 15–40)
Primidone Lvl: 1.3 ug/mL — ABNORMAL LOW (ref 5.0–12.0)

## 2021-11-24 ENCOUNTER — Telehealth: Payer: Self-pay | Admitting: *Deleted

## 2021-11-24 NOTE — Telephone Encounter (Signed)
Called and spoke w/ pt about results per AL,NP note. Pt verbalized understanding.

## 2021-11-24 NOTE — Telephone Encounter (Signed)
-----   Message from Debbora Presto, NP sent at 11/24/2021  7:23 AM EST ----- Please let him know that his primidone level looked good! He should be fine with increased dose of primidone as directed.

## 2021-11-28 ENCOUNTER — Ambulatory Visit: Payer: Medicare Other | Admitting: Urology

## 2021-12-19 ENCOUNTER — Encounter: Payer: Self-pay | Admitting: Urology

## 2021-12-19 ENCOUNTER — Other Ambulatory Visit: Payer: Self-pay | Admitting: Urology

## 2021-12-19 ENCOUNTER — Other Ambulatory Visit: Payer: Self-pay

## 2021-12-19 ENCOUNTER — Ambulatory Visit (INDEPENDENT_AMBULATORY_CARE_PROVIDER_SITE_OTHER): Payer: Medicare Other | Admitting: Urology

## 2021-12-19 VITALS — BP 136/78 | HR 79

## 2021-12-19 DIAGNOSIS — N3942 Incontinence without sensory awareness: Secondary | ICD-10-CM

## 2021-12-19 DIAGNOSIS — R339 Retention of urine, unspecified: Secondary | ICD-10-CM | POA: Diagnosis not present

## 2021-12-19 DIAGNOSIS — E1140 Type 2 diabetes mellitus with diabetic neuropathy, unspecified: Secondary | ICD-10-CM | POA: Diagnosis not present

## 2021-12-19 DIAGNOSIS — N3941 Urge incontinence: Secondary | ICD-10-CM

## 2021-12-19 DIAGNOSIS — Z7984 Long term (current) use of oral hypoglycemic drugs: Secondary | ICD-10-CM | POA: Diagnosis not present

## 2021-12-19 DIAGNOSIS — E7849 Other hyperlipidemia: Secondary | ICD-10-CM | POA: Diagnosis not present

## 2021-12-19 DIAGNOSIS — I10 Essential (primary) hypertension: Secondary | ICD-10-CM | POA: Diagnosis not present

## 2021-12-19 MED ORDER — CLOTRIMAZOLE-BETAMETHASONE 1-0.05 % EX CREA: 1.0000 "application " | TOPICAL_CREAM | Freq: Two times a day (BID) | CUTANEOUS | 0 refills | Status: DC

## 2021-12-19 NOTE — H&P (View-Only) (Signed)
12/19/2021 10:21 AM   Marc Burns 04-27-1946 [id number]  Referring provider: Practice, [facility] [place],  [place] [address]  Followup urinary incontinence   HPI: Marc Burns is a 75yo here for followup for urinary incontinence. He underwent UDS which showed capacity 195cc but he had multiple detrusor contractions at low volumes. He had pain with each contraction. First contraction 15cc. He has an indwelling foley catheter which he changes every 2-3 weeks.    PMH: Past Medical History:  Diagnosis Date   Arthritis    Bladder neck contracture    Complication of anesthesia    Difficult intubation   Difficult intubation    GERD (gastroesophageal reflux disease)    History of gastric ulcer    2007   History of kidney stones    History of prostate cancer    2004--  S/P PROSTATECTOMY   Hypertension    Urinary incontinence    Wears glasses     Surgical History: Past Surgical History:  Procedure Laterality Date   BALLOON DILATION N/A 09/12/2013   Procedure: CYSTO BALLOON DILATION/RETROGRADE URETHROGRAM;  Surgeon: Reece Packer, MD;  Location: [facility];  Service: Urology;  Laterality: N/A;   CYSTO/  URETHRAL DILATION/ BLADDER BX/ BILATERAL RETROGRADE PYELOGRAM  03-03-2010   CYSTO/ BALLOON DILATION AND VAPORIZATION BLADDER NECK & PROSTATIC FOSSA  04-28-2010   CYSTO/ BALLOON DILATION BLADDER NECK CONTRACTURE  07-23-2011;   11-17-2010;  08-08-2010   CYSTOSCOPY WITH DIRECT VISION INTERNAL URETHROTOMY N/A 04/24/2021   Procedure: CYSTOSCOPY WITH DIRECT VISION INTERNAL URETHROTOMY;  Surgeon: Cleon Gustin, MD;  Location: [facility];  Service: Urology;  Laterality: N/A;   FULGURATION OF LESION  04/24/2021   Procedure: FULGURATION OF BLADDER NECK;  Surgeon: Cleon Gustin, MD;  Location: [facility];  Service: Urology;;   MITOMYCIN C INJECTION  04/24/2021   Procedure: MITOMYCIN C INJECTION ANTERIOR BLADDER WALL;  Surgeon: Cleon Gustin, MD;   Location: [facility];  Service: Urology;;   PROSTATECTOMY  2004   RADICAL   TONSILLECTOMY  AS CHILD   TRANSURETHRAL INCISION AND RESECTION OF BLADDER NECK CONTRACTURE  [id number]   UMBILICAL HERNIA REPAIR      Home Medications:  Allergies as of 12/19/2021       Reactions   Sulfa Antibiotics Itching, Rash        Medication List        Accurate as of December 19, 2021 10:21 AM. If you have any questions, ask your nurse or doctor.          amLODipine-benazepril 5-20 MG capsule Commonly known as: LOTREL Take 1 capsule by mouth every morning.   aspirin EC 81 MG tablet Take 81 mg by mouth daily. Swallow whole.   CENTRUM SILVER ADULT 50+ PO Take 1 tablet by mouth daily.   ciprofloxacin 250 MG tablet Commonly known as: CIPRO Take 1 tablet (250 mg total) by mouth 2 (two) times daily.   ciprofloxacin 250 MG tablet Commonly known as: CIPRO Take 1 tablet by mouth twice daily   empagliflozin 25 MG Tabs tablet Commonly known as: JARDIANCE Take 25 mg by mouth daily.   furosemide 20 MG tablet Commonly known as: LASIX   gabapentin 100 MG capsule Commonly known as: NEURONTIN Take 200 mg by mouth at bedtime.   Gemtesa 75 MG Tabs Generic drug: Vibegron Take 1 capsule by mouth daily.   metoprolol tartrate 25 MG tablet Commonly known as: LOPRESSOR Take 25 mg by mouth 2 (  two) times daily.   omeprazole 20 MG capsule Commonly known as: PRILOSEC Take 20 mg by mouth every morning.   Ozempic (0.25 or 0.5 MG/DOSE) 2 MG/1.5ML Sopn Generic drug: Semaglutide(0.25 or 0.5MG /DOS) Inject 0.5 mg into the skin every Sunday.   primidone 50 MG tablet Commonly known as: Mysoline Take 1 tablet (50 mg total) by mouth 2 (two) times daily. Follow titration instructions provided separately in writing.   rosuvastatin 10 MG tablet Commonly known as: CRESTOR Take 10 mg by mouth at bedtime.        Allergies:  Allergies  Allergen Reactions   Sulfa Antibiotics Itching and Rash     Family History: Family History  Problem Relation Age of Onset   Heart attack Mother    Heart attack Father     Social History:  reports that he has never smoked. He has never used smokeless tobacco. He reports current alcohol use of about 6.0 standard drinks per week. He reports that he does not use drugs.  ROS: All other review of systems were reviewed and are negative except what is noted above in HPI  Physical Exam: BP 136/78    Pulse 79   Constitutional:  Alert and oriented, No acute distress. HEENT: [place] AT, moist mucus membranes.  Trachea midline, no masses. Cardiovascular: No clubbing, cyanosis, or edema. Respiratory: Normal respiratory effort, no increased work of breathing. GI: Abdomen is soft, nontender, nondistended, no abdominal masses GU: No CVA tenderness.  Lymph: No cervical or inguinal lymphadenopathy. Skin: No rashes, bruises or suspicious lesions. Neurologic: Grossly intact, no focal deficits, moving all 4 extremities. Psychiatric: Normal mood and affect.  Laboratory Data: Lab Results  Component Value Date   WBC 9.4 11/17/2010   HGB 15.0 09/12/2013   HCT 44.0 09/12/2013   MCV 87.8 11/17/2010   PLT 138 (L) 11/17/2010    Lab Results  Component Value Date   CREATININE 0.81 07/24/2021    No results found for: PSA  No results found for: TESTOSTERONE  Lab Results  Component Value Date   HGBA1C 7.2 (H) 04/22/2021    Urinalysis    Component Value Date/Time   COLORURINE YELLOW 02/26/2010 0930   APPEARANCEUR CLOUDY (A) 02/26/2010 0930   LABSPEC 1.019 02/26/2010 0930   PHURINE 5.5 02/26/2010 0930   GLUCOSEU NEGATIVE 02/26/2010 0930   HGBUR LARGE (A) 02/26/2010 0930   BILIRUBINUR NEGATIVE 02/26/2010 0930   KETONESUR NEGATIVE 02/26/2010 0930   PROTEINUR 30 (A) 02/26/2010 0930   UROBILINOGEN 0.2 02/26/2010 0930   NITRITE NEGATIVE 02/26/2010 0930   LEUKOCYTESUR SMALL (A) 02/26/2010 0930    Lab Results  Component Value Date   BACTERIA FEW (A)  02/26/2010    Pertinent Imaging: Urodynamics: Images reviewed and discussed with the patient  No results found for this or any previous visit.  No results found for this or any previous visit.  No results found for this or any previous visit.  No results found for this or any previous visit.  Results for orders placed during the hospital encounter of 07/20/17  US Renal  Narrative CLINICAL DATA:  75 year old male with urinary retention. Self catheterization. Initial encounter.  EXAM: RENAL / URINARY TRACT ULTRASOUND COMPLETE  COMPARISON:  None.  FINDINGS: Right Kidney:  Length: 11.3 cm. Echogenicity within normal limits. No mass or hydronephrosis visualized.  Left Kidney:  Length: 11.8 cm. Echogenicity within normal limits. No mass or hydronephrosis visualized.  Bladder:  Decompressed and not adequately assessed  IMPRESSION: Kidneys unremarkable  Bladder decompressed and  not assessed   Electronically Signed By: Genia Del M.D. On: 07/20/2017 12:25  No results found for this or any previous visit.  No results found for this or any previous visit.  No results found for this or any previous visit.   Assessment & Plan:    1.  Urinary incontinence without sensory awareness We discussed intravesical botox for his poor capacity bladder with unstable detrusor contractions. After discussing the procedure the patient wishes to proceed with surgery. Risks/benefits/alternatives discussed.    No follow-ups on file.  Nicolette Bang, MD  [facility] [place]

## 2021-12-19 NOTE — Patient Instructions (Signed)
Botulinum Toxin Bladder Injection A botulinum toxin bladder injection is a procedure to treat an overactive bladder. During the procedure, a drug called botulinum toxin is injected into the bladder through a long, thin needle. This drug relaxes the bladder muscles and reduces overactivity. You may need this procedure if your medicines are not working or you cannot take them. The procedure may be repeated as needed. The treatment is done once and it usually lasts for 6 months. Your health care provider will monitor you to see how well you respond. Tell a health care provider about: Any allergies you have. All medicines you are taking, including vitamins, herbs, eye drops, creams, and over-the-counter medicines. Any problems you or family members have had with anesthetic medicines. Any bleeding problems you have. Any surgeries you have had. Any medical conditions you have. Any previous reactions to a botulinum toxin injection. Any symptoms of urinary tract infection. These include chills, fever, a burning feeling when passing urine, and needing to pass urine often. Whether you are pregnant or may be pregnant. What are the risks? Generally this is a safe procedure. However, problems may occur, including: Not being able to pass urine. If this happens, you may need to have your bladder emptied with a thin tube (urinary catheter). Bleeding. Urinary tract infection. Allergic reaction to the botulinum toxin. Pain or burning when passing urine. Damage to nearby structures or organs. What happens before the procedure? When to stop eating and drinking Follow instructions from your health care provider about what you may eat and drink before your procedure. These may include: 8 hours before the procedure Stop eating most foods. Do not eat meat, fried foods, or fatty foods. Eat only light foods, such as toast or crackers. All liquids are okay except energy drinks and alcohol. 6 hours before the  procedure Stop eating. Drink only clear liquids, such as water, clear fruit juice, black coffee, plain tea, and sports drinks. Do not drink energy drinks or alcohol. 2 hours before the procedure Stop drinking all liquids. You may be allowed to take medicines with small sips of water. If you do not follow your health care provider's instructions, your procedure may be delayed or canceled. Medicines Ask your health care provider about: Changing or stopping your regular medicines. This is especially important if you are taking diabetes medicines or blood thinners. Taking medicines such as aspirin and ibuprofen. These medicines can thin your blood. Do not take these medicines unless your health care provider tells you to take them. Taking over-the-counter medicines, vitamins, herbs, and supplements. General instructions Ask your health care provider what steps will be taken to help prevent infection. These steps may include: Removing hair at the procedure site. Washing skin with a germ-killing soap. Taking antibiotic medicine. If you will be going home right after the procedure, plan to have a responsible adult: Take you home from the hospital or clinic. You will not be allowed to drive. Care for you for the time you are told. What happens during the procedure?  You will be asked to empty your bladder. An IV will be inserted into one of your veins. You will be given one or more of the following: A medicine to help you relax (sedative). A medicine to numb the area (local anesthetic). A medicine to make you fall asleep (general anesthetic). A long, thin scope called a cystoscope will be passed into your bladder through the part of the body that carries urine from your bladder (urethra). The cystoscope will  be used to fill your bladder with water. A long needle will be passed through the cystoscope and into the bladder. The botulinum toxin will be injected into your bladder. It may be  injected into multiple areas of your bladder. The cystoscope will be removed and your bladder will be emptied with a urinary catheter. The procedure may vary among health care providers and hospitals. What can I expect after the procedure? After your procedure, it is common to have: Blood-tinged urine. Burning or soreness when you pass urine. Follow these instructions at home: Medicines Take over-the-counter and prescription medicines only as told by your health care provider. If you were prescribed an antibiotic medicine, take it as told by your health care provider. Do not stop using the antibiotic even if you start to feel better. General instructions  If you were given a sedative during the procedure, it can affect you for several hours. Do not drive or operate machinery until your health care provider says that it is safe. Drink enough fluid to keep your urine pale yellow. Return to your normal activities as told by your health care provider. Ask your health care provider what activities are safe for you. Keep all follow-up visits. Contact a health care provider if you have: A fever or chills. Blood-tinged urine for more than one day after your procedure. Worsening pain or burning when you pass urine. Pain or burning when passing urine for more than two days after your procedure. Trouble emptying your bladder. Get help right away if you: Have bright red blood in your urine. Are unable to pass urine. Summary A botulinum toxin bladder injection is a procedure to treat an overactive bladder. This is generally a safe procedure. However, problems may occur, including not being able to pass urine, bleeding, infection, pain, and an allergic reaction to the botulinum toxin. You will be told when to stop eating and drinking, and what medicines to change or stop. Follow instructions carefully. After the procedure, it is common to have blood in your urine and to have soreness or burning when  passing urine. Contact a health care provider if you have a fever, blood in your urine for more than a few days, or trouble passing urine. Get help right away if you have bright red blood in your urine, or if you are unable to pass urine. This information is not intended to replace advice given to you by your health care provider. Make sure you discuss any questions you have with your health care provider. Document Revised: 06/13/2021 Document Reviewed: 06/13/2021 Elsevier Patient Education  [place].

## 2021-12-19 NOTE — Progress Notes (Signed)
Surgical Physician Order Form [facility] [place]  * Scheduling expectation : Next Available  *Length of Case: 30 minutes  *MD Preforming Case: Nicolette Bang, MD  *Assistant Needed: no  *Facility Preference: [name]  *Clearance needed: no  *Anticoagulation Instructions: Hold all anticoagulants  *Aspirin Instructions: Ok to continue Aspirin  -Admit type: OUTpatient  -Anesthesia: General  -Use Standing Orders:  NA  *Diagnosis:  Urinary incontinence, urge incontinence  *Procedure:      Cystoscopy with intravesical botox 100 units  Additional orders: Botox Injection 100 units  -Equipment:  NA -VTE Prophylaxis Standing Order SCDs       Other:   -Standing Lab Orders Per Anesthesia    Lab other: None  -Standing Test orders EKG/Chest x-ray per Anesthesia       Test other:   - Medications:  Ancef 2gm IV  -Other orders:  N/A  *Post-op visit Date/Instructions:  1-2 week follow up

## 2021-12-19 NOTE — Progress Notes (Signed)
12/19/2021 10:21 AM   [name] 18-Jun-1946 [id number]  Referring provider: Practice, [facility] [place],  [place] [address]  Followup urinary incontinence   HPI: Mr Hostetler is a 75yo here for followup for urinary incontinence. He underwent UDS which showed capacity 195cc but he had multiple detrusor contractions at low volumes. He had pain with each contraction. First contraction 15cc. He has an indwelling foley catheter which he changes every 2-3 weeks.    PMH: Past Medical History:  Diagnosis Date   Arthritis    Bladder neck contracture    Complication of anesthesia    Difficult intubation   Difficult intubation    GERD (gastroesophageal reflux disease)    History of gastric ulcer    2007   History of kidney stones    History of prostate cancer    2004--  S/P PROSTATECTOMY   Hypertension    Urinary incontinence    Wears glasses     Surgical History: Past Surgical History:  Procedure Laterality Date   BALLOON DILATION N/A 09/12/2013   Procedure: CYSTO BALLOON DILATION/RETROGRADE URETHROGRAM;  Surgeon: Reece Packer, MD;  Location: [facility];  Service: Urology;  Laterality: N/A;   CYSTO/  URETHRAL DILATION/ BLADDER BX/ BILATERAL RETROGRADE PYELOGRAM  03-03-2010   CYSTO/ BALLOON DILATION AND VAPORIZATION BLADDER NECK & PROSTATIC FOSSA  04-28-2010   CYSTO/ BALLOON DILATION BLADDER NECK CONTRACTURE  07-23-2011;   11-17-2010;  08-08-2010   CYSTOSCOPY WITH DIRECT VISION INTERNAL URETHROTOMY N/A 04/24/2021   Procedure: CYSTOSCOPY WITH DIRECT VISION INTERNAL URETHROTOMY;  Surgeon: Cleon Gustin, MD;  Location: [facility];  Service: Urology;  Laterality: N/A;   FULGURATION OF LESION  04/24/2021   Procedure: FULGURATION OF BLADDER NECK;  Surgeon: Cleon Gustin, MD;  Location: [facility];  Service: Urology;;   MITOMYCIN C INJECTION  04/24/2021   Procedure: MITOMYCIN C INJECTION ANTERIOR BLADDER WALL;  Surgeon: Cleon Gustin, MD;   Location: [facility];  Service: Urology;;   PROSTATECTOMY  2004   RADICAL   TONSILLECTOMY  AS CHILD   TRANSURETHRAL INCISION AND RESECTION OF BLADDER NECK CONTRACTURE  [id number]   UMBILICAL HERNIA REPAIR      Home Medications:  Allergies as of 12/19/2021       Reactions   Sulfa Antibiotics Itching, Rash        Medication List        Accurate as of December 19, 2021 10:21 AM. If you have any questions, ask your nurse or doctor.          amLODipine-benazepril 5-20 MG capsule Commonly known as: LOTREL Take 1 capsule by mouth every morning.   aspirin EC 81 MG tablet Take 81 mg by mouth daily. Swallow whole.   CENTRUM SILVER ADULT 50+ PO Take 1 tablet by mouth daily.   ciprofloxacin 250 MG tablet Commonly known as: CIPRO Take 1 tablet (250 mg total) by mouth 2 (two) times daily.   ciprofloxacin 250 MG tablet Commonly known as: CIPRO Take 1 tablet by mouth twice daily   empagliflozin 25 MG Tabs tablet Commonly known as: JARDIANCE Take 25 mg by mouth daily.   furosemide 20 MG tablet Commonly known as: LASIX   gabapentin 100 MG capsule Commonly known as: NEURONTIN Take 200 mg by mouth at bedtime.   Gemtesa 75 MG Tabs Generic drug: Vibegron Take 1 capsule by mouth daily.   metoprolol tartrate 25 MG tablet Commonly known as: LOPRESSOR Take 25 mg by mouth 2 (  two) times daily.   omeprazole 20 MG capsule Commonly known as: PRILOSEC Take 20 mg by mouth every morning.   Ozempic (0.25 or 0.5 MG/DOSE) 2 MG/1.5ML Sopn Generic drug: Semaglutide(0.25 or 0.5MG /DOS) Inject 0.5 mg into the skin every Sunday.   primidone 50 MG tablet Commonly known as: Mysoline Take 1 tablet (50 mg total) by mouth 2 (two) times daily. Follow titration instructions provided separately in writing.   rosuvastatin 10 MG tablet Commonly known as: CRESTOR Take 10 mg by mouth at bedtime.        Allergies:  Allergies  Allergen Reactions   Sulfa Antibiotics Itching and Rash     Family History: Family History  Problem Relation Age of Onset   Heart attack Mother    Heart attack Father     Social History:  reports that he has never smoked. He has never used smokeless tobacco. He reports current alcohol use of about 6.0 standard drinks per week. He reports that he does not use drugs.  ROS: All other review of systems were reviewed and are negative except what is noted above in HPI  Physical Exam: BP 136/78    Pulse 79   Constitutional:  Alert and oriented, No acute distress. HEENT:  AT, moist mucus membranes.  Trachea midline, no masses. Cardiovascular: No clubbing, cyanosis, or edema. Respiratory: Normal respiratory effort, no increased work of breathing. GI: Abdomen is soft, nontender, nondistended, no abdominal masses GU: No CVA tenderness.  Lymph: No cervical or inguinal lymphadenopathy. Skin: No rashes, bruises or suspicious lesions. Neurologic: Grossly intact, no focal deficits, moving all 4 extremities. Psychiatric: Normal mood and affect.  Laboratory Data: Lab Results  Component Value Date   WBC 9.4 11/17/2010   HGB 15.0 09/12/2013   HCT 44.0 09/12/2013   MCV 87.8 11/17/2010   PLT 138 (L) 11/17/2010    Lab Results  Component Value Date   CREATININE 0.81 07/24/2021    No results found for: PSA  No results found for: TESTOSTERONE  Lab Results  Component Value Date   HGBA1C 7.2 (H) 04/22/2021    Urinalysis    Component Value Date/Time   COLORURINE YELLOW 02/26/2010 0930   APPEARANCEUR CLOUDY (A) 02/26/2010 0930   LABSPEC 1.019 02/26/2010 0930   PHURINE 5.5 02/26/2010 0930   GLUCOSEU NEGATIVE 02/26/2010 0930   HGBUR LARGE (A) 02/26/2010 0930   BILIRUBINUR NEGATIVE 02/26/2010 0930   KETONESUR NEGATIVE 02/26/2010 0930   PROTEINUR 30 (A) 02/26/2010 0930   UROBILINOGEN 0.2 02/26/2010 0930   NITRITE NEGATIVE 02/26/2010 0930   LEUKOCYTESUR SMALL (A) 02/26/2010 0930    Lab Results  Component Value Date   BACTERIA FEW (A)  02/26/2010    Pertinent Imaging: Urodynamics: Images reviewed and discussed with the patient  No results found for this or any previous visit.  No results found for this or any previous visit.  No results found for this or any previous visit.  No results found for this or any previous visit.  Results for orders placed during the hospital encounter of 07/20/17  US Renal  Narrative CLINICAL DATA:  75 year old male with urinary retention. Self catheterization. Initial encounter.  EXAM: RENAL / URINARY TRACT ULTRASOUND COMPLETE  COMPARISON:  None.  FINDINGS: Right Kidney:  Length: 11.3 cm. Echogenicity within normal limits. No mass or hydronephrosis visualized.  Left Kidney:  Length: 11.8 cm. Echogenicity within normal limits. No mass or hydronephrosis visualized.  Bladder:  Decompressed and not adequately assessed  IMPRESSION: Kidneys unremarkable  Bladder decompressed and  not assessed   Electronically Signed By: Genia Del M.D. On: 07/20/2017 12:25  No results found for this or any previous visit.  No results found for this or any previous visit.  No results found for this or any previous visit.   Assessment & Plan:    1.  Urinary incontinence without sensory awareness We discussed intravesical botox for his poor capacity bladder with unstable detrusor contractions. After discussing the procedure the patient wishes to proceed with surgery. Risks/benefits/alternatives discussed.    No follow-ups on file.  Nicolette Bang, MD  [facility] [place]

## 2021-12-19 NOTE — Addendum Note (Signed)
Addended by: Cleon Gustin on: 12/19/2021 10:36 AM   Modules accepted: Orders

## 2021-12-19 NOTE — Progress Notes (Signed)

## 2021-12-23 ENCOUNTER — Other Ambulatory Visit: Payer: Self-pay | Admitting: Neurology

## 2021-12-23 NOTE — Progress Notes (Signed)
[facility] Posting Form   Surgery Date/Time: 01/08/2022  Surgeon: Dr. Nicolette Bang, MD  Surgery Location: Day Surgery  Inpt ( No  )   Outpt (Yes)   Obs ( No  )   Diagnosis: Urinary Incontinence, Urge Incontinence  -CPT: 29562, [id number]  Surgery: Cystoscopy with intravesical botox 100 units  Stop Anticoagulations: Yes, may continue ASA  Cardiac/Medical/Pulmonary Clearance needed: no  *Orders entered into EPIC  Date: 12/23/21   *Case booked in EPIC  Date: 12/19/2021  *Notified pt of Surgery: Date: 12/19/2021  *Placed into Prior Authorization Work Fabio Bering Date: 12/23/21   Assistant/laser/rep:No

## 2021-12-29 ENCOUNTER — Telehealth: Payer: Self-pay | Admitting: Family Medicine

## 2021-12-29 MED ORDER — PRIMIDONE 50 MG PO TABS: 50.0000 mg | ORAL_TABLET | Freq: Two times a day (BID) | ORAL | 5 refills | Status: DC

## 2021-12-29 NOTE — Addendum Note (Signed)
Addended by: Wyvonnia Lora on: 12/29/2021 03:21 PM   Modules accepted: Orders

## 2021-12-29 NOTE — Telephone Encounter (Signed)
Pt has called to report that since he has been increased to 2 a day of the :primidone (MYSOLINE) 50 MG tablet he has run out and his pharmacy will not fill before the 14th of January, please call.

## 2021-12-29 NOTE — Telephone Encounter (Signed)
Called and spoke with pt. Confirmed he is taking primidone 50mg  q am and pm. He is tolerating well and stable on therapy. Aware I will send in new script to pharmacy for him.

## 2021-12-29 NOTE — Telephone Encounter (Signed)
AL,NP last note from 11/20/21: "We will continue primidone 50mg  at bedtime. We will add 1/2 dose (25mg  or 1/2 tablet) during the day. If you tolerate it well you can increase dose to 50mg  in am and 50mg  in evening."

## 2022-01-01 NOTE — Patient Instructions (Signed)
Your procedure is scheduled on: 01/08/2022  Report to [facility] at  7:30   AM.  Call this number if you have problems the morning of surgery: [phone]   Remember:   Do not Eat or Drink after midnight         No Smoking the morning of surgery  :  Take these medicines the morning of surgery with A SIP OF WATER: Metoprolol, Gabapentin, omeprazole, primidone, and vibegron  No diabetic medications morning of surgery   Do not wear jewelry, make-up or nail polish.  Do not wear lotions, powders, or perfumes. You may wear deodorant.  Do not shave 48 hours prior to surgery. Men may shave face and neck.  Do not bring valuables to the hospital.  Contacts, dentures or bridgework may not be worn into surgery.  Leave suitcase in the car. After surgery it may be brought to your room.  For patients admitted to the hospital, checkout time is 11:00 AM the day of discharge.   Patients discharged the day of surgery will not be allowed to drive home.    Special Instructions: Shower using CHG night before surgery and shower the day of surgery use CHG.  Use special wash - you have one bottle of CHG for all showers.  You should use approximately 1/2 of the bottle for each shower.  How to Use Chlorhexidine for Bathing Chlorhexidine gluconate (CHG) is a germ-killing (antiseptic) solution that is used to clean the skin. It can get rid of the bacteria that normally live on the skin and can keep them away for about 24 hours. To clean your skin with CHG, you may be given: A CHG solution to use in the shower or as part of a sponge bath. A prepackaged cloth that contains CHG. Cleaning your skin with CHG may help lower the risk for infection: While you are staying in the intensive care unit of the hospital. If you have a vascular access, such as a central line, to provide short-term or long-term access to your veins. If you have a catheter to drain urine from your bladder. If you are on a  ventilator. A ventilator is a machine that helps you breathe by moving air in and out of your lungs. After surgery. What are the risks? Risks of using CHG include: A skin reaction. Hearing loss, if CHG gets in your ears and you have a perforated eardrum. Eye injury, if CHG gets in your eyes and is not rinsed out. The CHG product catching fire. Make sure that you avoid smoking and flames after applying CHG to your skin. Do not use CHG: If you have a chlorhexidine allergy or have previously reacted to chlorhexidine. On babies younger than 76 months of age. How to use CHG solution Use CHG only as told by your health care provider, and follow the instructions on the label. Use the full amount of CHG as directed. Usually, this is one bottle. During a shower Follow these steps when using CHG solution during a shower (unless your health care provider gives you different instructions): Start the shower. Use your normal soap and shampoo to wash your face and hair. Turn off the shower or move out of the shower stream. Pour the CHG onto a clean washcloth. Do not use any type of brush or rough-edged sponge. Starting at your neck, lather your body down to your toes. Make sure you follow these instructions: If you will be having surgery, pay special attention to  the part of your body where you will be having surgery. Scrub this area for at least 1 minute. Do not use CHG on your head or face. If the solution gets into your ears or eyes, rinse them well with water. Avoid your genital area. Avoid any areas of skin that have broken skin, cuts, or scrapes. Scrub your back and under your arms. Make sure to wash skin folds. Let the lather sit on your skin for 1-2 minutes or as long as told by your health care provider. Thoroughly rinse your entire body in the shower. Make sure that all body creases and crevices are rinsed well. Dry off with a clean towel. Do not put any substances on your body afterward--such  as powder, lotion, or perfume--unless you are told to do so by your health care provider. Only use lotions that are recommended by the manufacturer. Put on clean clothes or pajamas. If it is the night before your surgery, sleep in clean sheets.  During a sponge bath Follow these steps when using CHG solution during a sponge bath (unless your health care provider gives you different instructions): Use your normal soap and shampoo to wash your face and hair. Pour the CHG onto a clean washcloth. Starting at your neck, lather your body down to your toes. Make sure you follow these instructions: If you will be having surgery, pay special attention to the part of your body where you will be having surgery. Scrub this area for at least 1 minute. Do not use CHG on your head or face. If the solution gets into your ears or eyes, rinse them well with water. Avoid your genital area. Avoid any areas of skin that have broken skin, cuts, or scrapes. Scrub your back and under your arms. Make sure to wash skin folds. Let the lather sit on your skin for 1-2 minutes or as long as told by your health care provider. Using a different clean, wet washcloth, thoroughly rinse your entire body. Make sure that all body creases and crevices are rinsed well. Dry off with a clean towel. Do not put any substances on your body afterward--such as powder, lotion, or perfume--unless you are told to do so by your health care provider. Only use lotions that are recommended by the manufacturer. Put on clean clothes or pajamas. If it is the night before your surgery, sleep in clean sheets. How to use CHG prepackaged cloths Only use CHG cloths as told by your health care provider, and follow the instructions on the label. Use the CHG cloth on clean, dry skin. Do not use the CHG cloth on your head or face unless your health care provider tells you to. When washing with the CHG cloth: Avoid your genital area. Avoid any areas of skin  that have broken skin, cuts, or scrapes. Before surgery Follow these steps when using a CHG cloth to clean before surgery (unless your health care provider gives you different instructions): Using the CHG cloth, vigorously scrub the part of your body where you will be having surgery. Scrub using a back-and-forth motion for 3 minutes. The area on your body should be completely wet with CHG when you are done scrubbing. Do not rinse. Discard the cloth and let the area air-dry. Do not put any substances on the area afterward, such as powder, lotion, or perfume. Put on clean clothes or pajamas. If it is the night before your surgery, sleep in clean sheets.  For general bathing Follow these steps  when using CHG cloths for general bathing (unless your health care provider gives you different instructions). Use a separate CHG cloth for each area of your body. Make sure you wash between any folds of skin and between your fingers and toes. Wash your body in the following order, switching to a new cloth after each step: The front of your neck, shoulders, and chest. Both of your arms, under your arms, and your hands. Your stomach and groin area, avoiding the genitals. Your right leg and foot. Your left leg and foot. The back of your neck, your back, and your buttocks. Do not rinse. Discard the cloth and let the area air-dry. Do not put any substances on your body afterward--such as powder, lotion, or perfume--unless you are told to do so by your health care provider. Only use lotions that are recommended by the manufacturer. Put on clean clothes or pajamas. Contact a health care provider if: Your skin gets irritated after scrubbing. You have questions about using your solution or cloth. You swallow any chlorhexidine. Call your local poison control center (1-803-034-0016 in the [place] Get help right away if: Your eyes itch badly, or they become very red or swollen. Your skin itches badly and is red or  swollen. Your hearing changes. You have trouble seeing. You have swelling or tingling in your mouth or throat. You have trouble breathing. These symptoms may represent a serious problem that is an emergency. Do not wait to see if the symptoms will go away. Get medical help right away. Call your local emergency services (911 in the [place] Do not drive yourself to the hospital. Summary Chlorhexidine gluconate (CHG) is a germ-killing (antiseptic) solution that is used to clean the skin. Cleaning your skin with CHG may help to lower your risk for infection. You may be given CHG to use for bathing. It may be in a bottle or in a prepackaged cloth to use on your skin. Carefully follow your health care provider's instructions and the instructions on the product label. Do not use CHG if you have a chlorhexidine allergy. Contact your health care provider if your skin gets irritated after scrubbing. This information is not intended to replace advice given to you by your health care provider. Make sure you discuss any questions you have with your health care provider. Document Revised: 02/17/2021 Document Reviewed: 02/17/2021 Elsevier Patient Education  2022 [place]. Cystoscopy Cystoscopy is a procedure that is used to help diagnose and sometimes treat conditions that affect the lower urinary tract. The lower urinary tract includes the bladder and the urethra. The urethra is the tube that drains urine from the bladder. Cystoscopy is done using a thin, tube-shaped instrument with a light and camera at the end (cystoscope). The cystoscope may be hard or flexible, depending on the goal of the procedure. The cystoscope is inserted through the urethra, into the bladder. Cystoscopy may be recommended if you have: Urinary tract infections that keep coming back. Blood in the urine (hematuria). An inability to control when you urinate (urinary incontinence) or an overactive bladder. Unusual cells found in a  urine sample. A blockage in the urethra, such as a urinary stone. Painful urination. An abnormality in the bladder found during an intravenous pyelogram (IVP) or CT scan. Cystoscopy may also be done to remove a sample of tissue to be examined under a microscope (biopsy). Tell a health care provider about: Any allergies you have. All medicines you are taking, including vitamins, herbs, eye drops, creams, and  over-the-counter medicines. Any problems you or family members have had with anesthetic medicines. Any blood disorders you have. Any surgeries you have had. Any medical conditions you have. Whether you are pregnant or may be pregnant. What are the risks? Generally, this is a safe procedure. However, problems may occur, including: Infection. Bleeding. Allergic reactions to medicines. Damage to other structures or organs. What happens before the procedure? Medicines Ask your health care provider about: Changing or stopping your regular medicines. This is especially important if you are taking diabetes medicines or blood thinners. Taking medicines such as aspirin and ibuprofen. These medicines can thin your blood. Do not take these medicines unless your health care provider tells you to take them. Taking over-the-counter medicines, vitamins, herbs, and supplements. Tests You may have an exam or testing, such as: X-rays of the bladder, urethra, or kidneys. CT scan of the abdomen or pelvis. Urine tests to check for signs of infection. General instructions Follow instructions from your health care provider about eating or drinking restrictions. Ask your health care provider what steps will be taken to help prevent infection. These steps may include: Washing skin with a germ-killing soap. Taking antibiotic medicine. Plan to have a responsible adult take you home from the hospital or clinic. What happens during the procedure?  You will be given one or more of the following: A  medicine to help you relax (sedative). A medicine to numb the area (local anesthetic). The area around the opening of your urethra will be cleaned. The cystoscope will be passed through your urethra into your bladder. Germ-free (sterile) fluid will flow through the cystoscope to fill your bladder. The fluid will stretch your bladder so that your health care provider can clearly examine your bladder walls. Your doctor will look at the urethra and bladder. Your doctor may take a biopsy or remove stones. The cystoscope will be removed, and your bladder will be emptied. The procedure may vary among health care providers and hospitals. What can I expect after the procedure? After the procedure, it is common to have: Some soreness or pain in your abdomen and urethra. Urinary symptoms. These include: Mild pain or burning when you urinate. Pain should stop within a few minutes after you urinate. This may last for up to 1 week. A small amount of blood in your urine for several days. Feeling like you need to urinate but producing only a small amount of urine. Follow these instructions at home: Medicines Take over-the-counter and prescription medicines only as told by your health care provider. If you were prescribed an antibiotic medicine, take it as told by your health care provider. Do not stop taking the antibiotic even if you start to feel better. General instructions Return to your normal activities as told by your health care provider. Ask your health care provider what activities are safe for you. If you were given a sedative during the procedure, it can affect you for several hours. Do not drive or operate machinery until your health care provider says that it is safe. Watch for any blood in your urine. If the amount of blood in your urine increases, call your health care provider. Follow instructions from your health care provider about eating or drinking restrictions. If a tissue sample was  removed for testing (biopsy) during your procedure, it is up to you to get your test results. Ask your health care provider, or the department that is doing the test, when your results will be ready. Drink enough fluid  to keep your urine pale yellow. Keep all follow-up visits. This is important. Contact a health care provider if: You have pain that gets worse or does not get better with medicine, especially pain when you urinate. You have trouble urinating. You have more blood in your urine. Get help right away if: You have blood clots in your urine. You have abdominal pain. You have a fever or chills. You are unable to urinate. Summary Cystoscopy is a procedure that is used to help diagnose and sometimes treat conditions that affect the lower urinary tract. Cystoscopy is done using a thin, tube-shaped instrument with a light and camera at the end. After the procedure, it is common to have some soreness or pain in your abdomen and urethra. Watch for any blood in your urine. If the amount of blood in your urine increases, call your health care provider. If you were prescribed an antibiotic medicine, take it as told by your health care provider. Do not stop taking the antibiotic even if you start to feel better. This information is not intended to replace advice given to you by your health care provider. Make sure you discuss any questions you have with your health care provider. Document Revised: 08/20/2021 Document Reviewed: 07/19/2020 Elsevier Patient Education  [place] Anesthesia, Adult, Care After This sheet gives you information about how to care for yourself after your procedure. Your health care provider may also give you more specific instructions. If you have problems or questions, contact your health care provider. What can I expect after the procedure? After the procedure, the following side effects are common: Pain or discomfort at the IV  site. Nausea. Vomiting. Sore throat. Trouble concentrating. Feeling cold or chills. Feeling weak or tired. Sleepiness and fatigue. Soreness and body aches. These side effects can affect parts of the body that were not involved in surgery. Follow these instructions at home: For the time period you were told by your health care provider:  Rest. Do not participate in activities where you could fall or become injured. Do not drive or use machinery. Do not drink alcohol. Do not take sleeping pills or medicines that cause drowsiness. Do not make important decisions or sign legal documents. Do not take care of children on your own. Eating and drinking Follow any instructions from your health care provider about eating or drinking restrictions. When you feel hungry, start by eating small amounts of foods that are soft and easy to digest (bland), such as toast. Gradually return to your regular diet. Drink enough fluid to keep your urine pale yellow. If you vomit, rehydrate by drinking water, juice, or clear broth. General instructions If you have sleep apnea, surgery and certain medicines can increase your risk for breathing problems. Follow instructions from your health care provider about wearing your sleep device: Anytime you are sleeping, including during daytime naps. While taking prescription pain medicines, sleeping medicines, or medicines that make you drowsy. Have a responsible adult stay with you for the time you are told. It is important to have someone help care for you until you are awake and alert. Return to your normal activities as told by your health care provider. Ask your health care provider what activities are safe for you. Take over-the-counter and prescription medicines only as told by your health care provider. If you smoke, do not smoke without supervision. Keep all follow-up visits as told by your health care provider. This is important. Contact a health care  provider if:  You have nausea or vomiting that does not get better with medicine. You cannot eat or drink without vomiting. You have pain that does not get better with medicine. You are unable to pass urine. You develop a skin rash. You have a fever. You have redness around your IV site that gets worse. Get help right away if: You have difficulty breathing. You have chest pain. You have blood in your urine or stool, or you vomit blood. Summary After the procedure, it is common to have a sore throat or nausea. It is also common to feel tired. Have a responsible adult stay with you for the time you are told. It is important to have someone help care for you until you are awake and alert. When you feel hungry, start by eating small amounts of foods that are soft and easy to digest (bland), such as toast. Gradually return to your regular diet. Drink enough fluid to keep your urine pale yellow. Return to your normal activities as told by your health care provider. Ask your health care provider what activities are safe for you. This information is not intended to replace advice given to you by your health care provider. Make sure you discuss any questions you have with your health care provider. Document Revised: 08/22/2020 Document Reviewed: 03/21/2020 Elsevier Patient Education  2022 [employer].

## 2022-01-06 ENCOUNTER — Encounter (HOSPITAL_COMMUNITY): Payer: Self-pay

## 2022-01-06 ENCOUNTER — Encounter (HOSPITAL_COMMUNITY)
Admission: RE | Admit: 2022-01-06 | Discharge: 2022-01-06 | Disposition: A | Discharge status: Home / Self Care | Payer: Medicare Other | Source: Ambulatory Visit | Attending: Urology | Admitting: Urology

## 2022-01-06 VITALS — BP 129/61 | HR 62 | Temp 97.5°F | Ht 68.0 in | Wt 246.9 lb

## 2022-01-06 DIAGNOSIS — Z79899 Other long term (current) drug therapy: Secondary | ICD-10-CM | POA: Insufficient documentation

## 2022-01-06 DIAGNOSIS — E119 Type 2 diabetes mellitus without complications: Secondary | ICD-10-CM | POA: Insufficient documentation

## 2022-01-06 DIAGNOSIS — Z01818 Encounter for other preprocedural examination: Secondary | ICD-10-CM | POA: Insufficient documentation

## 2022-01-06 LAB — BASIC METABOLIC PANEL
Anion gap: 12 (ref 5–15)
BUN: 20 mg/dL (ref 8–23)
CO2: 23 mmol/L (ref 22–32)
Calcium: 9.1 mg/dL (ref 8.9–10.3)
Chloride: 104 mmol/L (ref 98–111)
Creatinine, Ser: 0.81 mg/dL (ref 0.61–1.24)
GFR, Estimated: >60 mL/min (ref >60)
Glucose, Bld: 123 mg/dL — ABNORMAL HIGH (ref 70–99)
Potassium: 4.4 mmol/L (ref 3.5–5.1)
Sodium: 139 mmol/L (ref 135–145)

## 2022-01-06 NOTE — Pre-Procedure Instructions (Signed)
Spoke with Garret, RpH about botox. Release order am of surgery and he will make sure it is in stock.

## 2022-01-08 ENCOUNTER — Encounter (HOSPITAL_COMMUNITY)
Admission: RE | Disposition: A | Discharge status: Home / Self Care | Payer: Self-pay | Source: Home / Self Care | Attending: Urology

## 2022-01-08 ENCOUNTER — Ambulatory Visit (HOSPITAL_COMMUNITY): Payer: Medicare Other | Admitting: Anesthesiology

## 2022-01-08 ENCOUNTER — Other Ambulatory Visit: Payer: Self-pay

## 2022-01-08 ENCOUNTER — Ambulatory Visit (HOSPITAL_COMMUNITY)
Admission: RE | Admit: 2022-01-08 | Discharge: 2022-01-08 | Disposition: A | Discharge status: Home / Self Care | Payer: Medicare Other | Attending: Urology | Admitting: Urology

## 2022-01-08 ENCOUNTER — Encounter (HOSPITAL_COMMUNITY): Payer: Self-pay | Admitting: Urology

## 2022-01-08 DIAGNOSIS — I10 Essential (primary) hypertension: Secondary | ICD-10-CM | POA: Insufficient documentation

## 2022-01-08 DIAGNOSIS — N3941 Urge incontinence: Secondary | ICD-10-CM

## 2022-01-08 DIAGNOSIS — E119 Type 2 diabetes mellitus without complications: Secondary | ICD-10-CM | POA: Diagnosis not present

## 2022-01-08 DIAGNOSIS — N3942 Incontinence without sensory awareness: Secondary | ICD-10-CM

## 2022-01-08 DIAGNOSIS — N3281 Overactive bladder: Secondary | ICD-10-CM | POA: Insufficient documentation

## 2022-01-08 DIAGNOSIS — N319 Neuromuscular dysfunction of bladder, unspecified: Secondary | ICD-10-CM | POA: Insufficient documentation

## 2022-01-08 DIAGNOSIS — R32 Unspecified urinary incontinence: Secondary | ICD-10-CM | POA: Diagnosis present

## 2022-01-08 DIAGNOSIS — K219 Gastro-esophageal reflux disease without esophagitis: Secondary | ICD-10-CM | POA: Diagnosis not present

## 2022-01-08 HISTORY — PX: BOTOX INJECTION: SHX5754

## 2022-01-08 HISTORY — PX: CYSTOSCOPY: SHX5120

## 2022-01-08 LAB — GLUCOSE, CAPILLARY
Glucose-Capillary: 129 mg/dL — ABNORMAL HIGH (ref 70–99)
Glucose-Capillary: 134 mg/dL — ABNORMAL HIGH (ref 70–99)

## 2022-01-08 SURGERY — CYSTOSCOPY
Anesthesia: General | Site: Bladder

## 2022-01-08 MED ORDER — FENTANYL CITRATE PF 50 MCG/ML IJ SOSY: 25.0000 ug | PREFILLED_SYRINGE | INTRAMUSCULAR | Status: DC | PRN

## 2022-01-08 MED ORDER — EPHEDRINE SULFATE-NACL 50-0.9 MG/10ML-% IV SOSY
PREFILLED_SYRINGE | INTRAVENOUS | Status: DC | PRN
  Administered 2022-01-08: 5 mg via INTRAVENOUS

## 2022-01-08 MED ORDER — DEXAMETHASONE SODIUM PHOSPHATE 4 MG/ML IJ SOLN
INTRAMUSCULAR | Status: DC | PRN
Start: 2022-01-08 — End: 2022-01-08
  Administered 2022-01-08: 8 mg via INTRAVENOUS

## 2022-01-08 MED ORDER — SODIUM CHLORIDE 0.9 % IV SOLN
INTRAVENOUS | Status: DC | PRN
  Administered 2022-01-08: 20 mL via INTRAMUSCULAR

## 2022-01-08 MED ORDER — ONABOTULINUMTOXINA 100 UNITS IJ SOLR
INTRAMUSCULAR | Status: DC | PRN
  Administered 2022-01-08: 100 [IU] via INTRAMUSCULAR

## 2022-01-08 MED ORDER — EPHEDRINE 5 MG/ML INJ
INTRAVENOUS | Status: AC
  Filled 2022-01-08: qty 5

## 2022-01-08 MED ORDER — ONABOTULINUMTOXINA 100 UNITS IJ SOLR: 100.0000 [IU] | Freq: Once | INTRAMUSCULAR | Status: DC

## 2022-01-08 MED ORDER — ONABOTULINUMTOXINA 100 UNITS IJ SOLR
INTRAMUSCULAR | Status: AC
  Filled 2022-01-08: qty 100

## 2022-01-08 MED ORDER — LIDOCAINE HCL (PF) 2 % IJ SOLN
INTRAMUSCULAR | Status: AC
  Filled 2022-01-08: qty 10

## 2022-01-08 MED ORDER — LACTATED RINGERS IV SOLN: INTRAVENOUS | Status: DC

## 2022-01-08 MED ORDER — CHLORHEXIDINE GLUCONATE 0.12 % MT SOLN
15.0000 mL | Freq: Once | OROMUCOSAL | Status: AC
  Administered 2022-01-08: 15 mL via OROMUCOSAL

## 2022-01-08 MED ORDER — SODIUM CHLORIDE (PF) 0.9 % IJ SOLN
INTRAMUSCULAR | Status: AC
  Filled 2022-01-08: qty 20

## 2022-01-08 MED ORDER — ETOMIDATE 2 MG/ML IV SOLN
INTRAVENOUS | Status: AC
  Filled 2022-01-08: qty 10

## 2022-01-08 MED ORDER — FENTANYL CITRATE (PF) 100 MCG/2ML IJ SOLN
INTRAMUSCULAR | Status: AC
  Filled 2022-01-08: qty 2

## 2022-01-08 MED ORDER — DEXAMETHASONE SODIUM PHOSPHATE 10 MG/ML IJ SOLN
INTRAMUSCULAR | Status: AC
  Filled 2022-01-08: qty 2

## 2022-01-08 MED ORDER — PROPOFOL 10 MG/ML IV BOLUS
INTRAVENOUS | Status: DC | PRN
  Administered 2022-01-08: 70 mg via INTRAVENOUS
  Administered 2022-01-08: 130 mg via INTRAVENOUS

## 2022-01-08 MED ORDER — ONDANSETRON HCL 4 MG/2ML IJ SOLN: 4.0000 mg | Freq: Once | INTRAMUSCULAR | Status: DC | PRN

## 2022-01-08 MED ORDER — CEFAZOLIN SODIUM-DEXTROSE 2-4 GM/100ML-% IV SOLN
2.0000 g | INTRAVENOUS | Status: AC
  Administered 2022-01-08: 2 g via INTRAVENOUS
  Filled 2022-01-08: qty 100

## 2022-01-08 MED ORDER — PHENYLEPHRINE 40 MCG/ML (10ML) SYRINGE FOR IV PUSH (FOR BLOOD PRESSURE SUPPORT)
PREFILLED_SYRINGE | INTRAVENOUS | Status: AC
  Filled 2022-01-08: qty 10

## 2022-01-08 MED ORDER — FENTANYL CITRATE (PF) 100 MCG/2ML IJ SOLN
INTRAMUSCULAR | Status: DC | PRN
  Administered 2022-01-08 (×2): 50 ug via INTRAVENOUS

## 2022-01-08 MED ORDER — PROPOFOL 10 MG/ML IV BOLUS
INTRAVENOUS | Status: AC
  Filled 2022-01-08: qty 20

## 2022-01-08 MED ORDER — ORAL CARE MOUTH RINSE: 15.0000 mL | Freq: Once | OROMUCOSAL | Status: AC

## 2022-01-08 MED ORDER — ONDANSETRON HCL 4 MG/2ML IJ SOLN
INTRAMUSCULAR | Status: DC | PRN
Start: 2022-01-08 — End: 2022-01-08
  Administered 2022-01-08: 4 mg via INTRAVENOUS

## 2022-01-08 MED ORDER — LIDOCAINE HCL (CARDIAC) PF 100 MG/5ML IV SOSY
PREFILLED_SYRINGE | INTRAVENOUS | Status: DC | PRN
  Administered 2022-01-08: 80 mg via INTRATRACHEAL

## 2022-01-08 MED ORDER — ONDANSETRON HCL 4 MG/2ML IJ SOLN
INTRAMUSCULAR | Status: AC
  Filled 2022-01-08: qty 2

## 2022-01-08 MED ORDER — STERILE WATER FOR IRRIGATION IR SOLN
Status: DC | PRN
  Administered 2022-01-08: 3000 mL

## 2022-01-08 SURGICAL SUPPLY — 20 items
BAG DRAIN URO TABLE W/ADPT NS (BAG) ×2 IMPLANT
BAG DRN 8 ADPR NS SKTRN CSTL (BAG) ×1
BAG DRN RND TRDRP ANRFLXCHMBR (UROLOGICAL SUPPLIES) ×1
BAG HAMPER (MISCELLANEOUS) ×2 IMPLANT
BAG URINE DRAIN 2000ML AR STRL (UROLOGICAL SUPPLIES) ×1 IMPLANT
CATH FOLEY 2WAY SLVR  5CC 20FR (CATHETERS) ×1
CATH FOLEY 2WAY SLVR 5CC 20FR (CATHETERS) IMPLANT
CLOTH BEACON ORANGE TIMEOUT ST (SAFETY) ×2 IMPLANT
GLOVE SURG POLYISO LF SZ8 (GLOVE) ×2 IMPLANT
GLOVE SURG UNDER POLY LF SZ7 (GLOVE) ×3 IMPLANT
GOWN STRL REUS W/TWL LRG LVL3 (GOWN DISPOSABLE) ×4 IMPLANT
GOWN STRL REUS W/TWL XL LVL3 (GOWN DISPOSABLE) ×2 IMPLANT
KIT TURNOVER CYSTO (KITS) ×2 IMPLANT
MANIFOLD NEPTUNE II (INSTRUMENTS) ×2 IMPLANT
PACK CYSTO (CUSTOM PROCEDURE TRAY) ×2 IMPLANT
PAD ARMBOARD 7.5X6 YLW CONV (MISCELLANEOUS) ×2 IMPLANT
SYR 10ML LL (SYRINGE) ×1 IMPLANT
TOWEL NATURAL 4PK STERILE (DISPOSABLE) ×2 IMPLANT
WATER STERILE IRR 3000ML UROMA (IV SOLUTION) ×2 IMPLANT
WATER STERILE IRR 500ML POUR (IV SOLUTION) ×2 IMPLANT

## 2022-01-08 NOTE — Op Note (Signed)
Preoperative diagnosis: neurogenic bladder, overactive bladder  Postoperative diagnosis: same  Procedure: 1 cystoscopy 2. Intravesical botox injection 100 units  Attending: Nicolette Bang  Anesthesia: General  Estimated blood loss: Minimal  Drains: 20 french foley  Specimens: none  Antibiotics: ancef  Findings:  Ureteral orifices in normal anatomic location.  No masses/lesions in the bladder  Indications: Patient is a 76 year old male with a history of neurogenic bladder and urinary leakage refractory to anticholinergic therapy.  After discussing treatment options, they decided proceed with intravesical botox injection.  Procedure her in detail: The patient was brought to the operating room and a brief timeout was done to ensure correct patient, correct procedure, correct site.  General anesthesia was administered patient was placed in dorsal lithotomy position.  Their genitalia was then prepped and draped in usual sterile fashion.  A rigid 47 French cystoscope was passed in the urethra and the bladder.  Bladder was inspected and we noted no masses or lesions.  the ureteral orifices were in the normal orthotopic locations. Using a deflux injection needle we proceed to inject 100 units in a grid pattern between the ureteral orifices starting at the trigone to the mid posterior wall. We injected a total of 20 sites with 5 units per injection. The bladder was then drained and this concluded the procedure which was well tolerated by patient.  Complications: None  Condition: Stable, extubated, transferred to PACU  Plan: Patient is to be discharge home. They will followup in 1 month

## 2022-01-08 NOTE — Interval H&P Note (Signed)
History and Physical Interval Note:  01/08/2022 9:12 AM  Marc Burns  has presented today for surgery, with the diagnosis of urinary incontinence,urge incontinence.  The various methods of treatment have been discussed with the patient and family. After consideration of risks, benefits and other options for treatment, the patient has consented to  Procedure(s): CYSTOSCOPY WITH INTRAVESICAL BOTOX 100 UNITS (N/A) BOTOX INJECTION (N/A) as a surgical intervention.  The patient's history has been reviewed, patient examined, no change in status, stable for surgery.  I have reviewed the patient's chart and labs.  Questions were answered to the patient's satisfaction.     Nicolette Bang

## 2022-01-08 NOTE — Anesthesia Procedure Notes (Signed)
Procedure Name: LMA Insertion Date/Time: 01/08/2022 9:43 AM Performed by: Minerva Ends, CRNA Pre-anesthesia Checklist: Patient identified, Emergency Drugs available, Suction available and Patient being monitored Patient Re-evaluated:Patient Re-evaluated prior to induction Oxygen Delivery Method: Circle system utilized Preoxygenation: Pre-oxygenation with 100% oxygen Induction Type: IV induction LMA: LMA inserted LMA Size: 4.0 Tube type: Oral Number of attempts: 1 Placement Confirmation: positive ETCO2 and breath sounds checked- equal and bilateral Tube secured with: Tape Dental Injury: Teeth and Oropharynx as per pre-operative assessment

## 2022-01-08 NOTE — Anesthesia Postprocedure Evaluation (Signed)
Anesthesia Post Note  Patient: Marc Burns  Procedure(s) Performed: CYSTOSCOPY WITH INTRAVESICAL BOTOX 100 UNITS (Bladder) BOTOX INJECTION (Bladder)  Patient location during evaluation: PACU Anesthesia Type: General Level of consciousness: awake and alert Pain management: pain level controlled Vital Signs Assessment: post-procedure vital signs reviewed and stable Respiratory status: spontaneous breathing, nonlabored ventilation, respiratory function stable and patient connected to nasal cannula oxygen Cardiovascular status: blood pressure returned to baseline and stable Postop Assessment: no apparent nausea or vomiting Anesthetic complications: no   No notable events documented.   Last Vitals:  Vitals:   01/08/22 1100 01/08/22 1120  BP: (!) 147/76 (!) 160/73  Pulse: 74 66  Resp: 11 14  Temp:  36.9 C  SpO2: 96% 94%    Last Pain:  Vitals:   01/08/22 1120  TempSrc: Oral  PainSc: 5                  Trixie Rude

## 2022-01-08 NOTE — Transfer of Care (Signed)
Immediate Anesthesia Transfer of Care Note  Patient: Marc Burns  Procedure(s) Performed: CYSTOSCOPY WITH INTRAVESICAL BOTOX 100 UNITS (Bladder) BOTOX INJECTION (Bladder)  Patient Location: PACU  Anesthesia Type:General  Level of Consciousness: awake, alert  and oriented  Airway & Oxygen Therapy: Patient Spontanous Breathing  Post-op Assessment: Report given to RN and Post -op Vital signs reviewed and stable  Post vital signs: Reviewed and stable  Last Vitals:  Vitals Value Taken Time  BP 141/71 01/08/22 1021  Temp    Pulse 77 01/08/22 1022  Resp 15 01/08/22 1022  SpO2 91 % 01/08/22 1022  Vitals shown include unvalidated device data.  Last Pain:  Vitals:   01/08/22 0734  TempSrc: Oral         Complications: No notable events documented.

## 2022-01-08 NOTE — Anesthesia Preprocedure Evaluation (Signed)
Anesthesia Evaluation  Patient identified by MRN, date of birth, ID band Patient awake    Reviewed: Allergy & Precautions, NPO status , Patient's Chart, lab work & pertinent test results  History of Anesthesia Complications (+) DIFFICULT AIRWAY and history of anesthetic complications  Airway Mallampati: I  TM Distance: >3 FB Neck ROM: Full    Dental no notable dental hx. (+)  Many missing:   Pulmonary neg pulmonary ROS,    Pulmonary exam normal        Cardiovascular hypertension, negative cardio ROS Normal cardiovascular exam     Neuro/Psych negative neurological ROS     GI/Hepatic negative GI ROS, Neg liver ROS, GERD  ,  Endo/Other  negative endocrine ROS  Renal/GU negative Renal ROS     Musculoskeletal negative musculoskeletal ROS (+) Arthritis ,   Abdominal   Peds  Hematology negative hematology ROS (+)   Anesthesia Other Findings   Reproductive/Obstetrics                             Anesthesia Physical Anesthesia Plan  ASA: 2  Anesthesia Plan: General   Post-op Pain Management:    Induction: Intravenous  PONV Risk Score and Plan:   Airway Management Planned: LMA  Additional Equipment:   Intra-op Plan:   Post-operative Plan:   Informed Consent: I have reviewed the patients History and Physical, chart, labs and discussed the procedure including the risks, benefits and alternatives for the proposed anesthesia with the patient or authorized representative who has indicated his/her understanding and acceptance.     Dental advisory given  Plan Discussed with: CRNA  Anesthesia Plan Comments:         Anesthesia Quick Evaluation

## 2022-01-09 ENCOUNTER — Encounter (HOSPITAL_COMMUNITY): Payer: Self-pay | Admitting: Urology

## 2022-01-23 ENCOUNTER — Encounter: Payer: Self-pay | Admitting: Urology

## 2022-01-23 ENCOUNTER — Ambulatory Visit (INDEPENDENT_AMBULATORY_CARE_PROVIDER_SITE_OTHER): Payer: Medicare Other | Admitting: Urology

## 2022-01-23 ENCOUNTER — Other Ambulatory Visit: Payer: Self-pay

## 2022-01-23 VITALS — BP 121/70 | HR 61

## 2022-01-23 DIAGNOSIS — N3942 Incontinence without sensory awareness: Secondary | ICD-10-CM

## 2022-01-23 NOTE — Patient Instructions (Signed)
Botulinum Toxin Bladder Injection A botulinum toxin bladder injection is a procedure to treat an overactive bladder. During the procedure, a drug called botulinum toxin is injected into the bladder through a long, thin needle. This drug relaxes the bladder muscles and reduces overactivity. You may need this procedure if your medicines are not working or you cannot take them. The procedure may be repeated as needed. The treatment is done once and it usually lasts for 6 months. Your health care provider will monitor you to see how well you respond. Tell a health care provider about: Any allergies you have. All medicines you are taking, including vitamins, herbs, eye drops, creams, and over-the-counter medicines. Any problems you or family members have had with anesthetic medicines. Any bleeding problems you have. Any surgeries you have had. Any medical conditions you have. Any previous reactions to a botulinum toxin injection. Any symptoms of urinary tract infection. These include chills, fever, a burning feeling when passing urine, and needing to pass urine often. Whether you are pregnant or may be pregnant. What are the risks? Generally this is a safe procedure. However, problems may occur, including: Not being able to pass urine. If this happens, you may need to have your bladder emptied with a thin tube (urinary catheter). Bleeding. Urinary tract infection. Allergic reaction to the botulinum toxin. Pain or burning when passing urine. Damage to nearby structures or organs. What happens before the procedure? When to stop eating and drinking Follow instructions from your health care provider about what you may eat and drink before your procedure. These may include: 8 hours before the procedure Stop eating most foods. Do not eat meat, fried foods, or fatty foods. Eat only light foods, such as toast or crackers. All liquids are okay except energy drinks and alcohol. 6 hours before the  procedure Stop eating. Drink only clear liquids, such as water, clear fruit juice, black coffee, plain tea, and sports drinks. Do not drink energy drinks or alcohol. 2 hours before the procedure Stop drinking all liquids. You may be allowed to take medicines with small sips of water. If you do not follow your health care provider's instructions, your procedure may be delayed or canceled. Medicines Ask your health care provider about: Changing or stopping your regular medicines. This is especially important if you are taking diabetes medicines or blood thinners. Taking medicines such as aspirin and ibuprofen. These medicines can thin your blood. Do not take these medicines unless your health care provider tells you to take them. Taking over-the-counter medicines, vitamins, herbs, and supplements. General instructions Ask your health care provider what steps will be taken to help prevent infection. These steps may include: Removing hair at the procedure site. Washing skin with a germ-killing soap. Taking antibiotic medicine. If you will be going home right after the procedure, plan to have a responsible adult: Take you home from the hospital or clinic. You will not be allowed to drive. Care for you for the time you are told. What happens during the procedure?  You will be asked to empty your bladder. An IV will be inserted into one of your veins. You will be given one or more of the following: A medicine to help you relax (sedative). A medicine to numb the area (local anesthetic). A medicine to make you fall asleep (general anesthetic). A long, thin scope called a cystoscope will be passed into your bladder through the part of the body that carries urine from your bladder (urethra). The cystoscope will  be used to fill your bladder with water. A long needle will be passed through the cystoscope and into the bladder. The botulinum toxin will be injected into your bladder. It may be  injected into multiple areas of your bladder. The cystoscope will be removed and your bladder will be emptied with a urinary catheter. The procedure may vary among health care providers and hospitals. What can I expect after the procedure? After your procedure, it is common to have: Blood-tinged urine. Burning or soreness when you pass urine. Follow these instructions at home: Medicines Take over-the-counter and prescription medicines only as told by your health care provider. If you were prescribed an antibiotic medicine, take it as told by your health care provider. Do not stop using the antibiotic even if you start to feel better. General instructions  If you were given a sedative during the procedure, it can affect you for several hours. Do not drive or operate machinery until your health care provider says that it is safe. Drink enough fluid to keep your urine pale yellow. Return to your normal activities as told by your health care provider. Ask your health care provider what activities are safe for you. Keep all follow-up visits. Contact a health care provider if you have: A fever or chills. Blood-tinged urine for more than one day after your procedure. Worsening pain or burning when you pass urine. Pain or burning when passing urine for more than two days after your procedure. Trouble emptying your bladder. Get help right away if you: Have bright red blood in your urine. Are unable to pass urine. Summary A botulinum toxin bladder injection is a procedure to treat an overactive bladder. This is generally a safe procedure. However, problems may occur, including not being able to pass urine, bleeding, infection, pain, and an allergic reaction to the botulinum toxin. You will be told when to stop eating and drinking, and what medicines to change or stop. Follow instructions carefully. After the procedure, it is common to have blood in your urine and to have soreness or burning when  passing urine. Contact a health care provider if you have a fever, blood in your urine for more than a few days, or trouble passing urine. Get help right away if you have bright red blood in your urine, or if you are unable to pass urine. This information is not intended to replace advice given to you by your health care provider. Make sure you discuss any questions you have with your health care provider. Document Revised: 06/13/2021 Document Reviewed: 06/13/2021 Elsevier Patient Education  [place].

## 2022-01-23 NOTE — Progress Notes (Signed)

## 2022-01-23 NOTE — Progress Notes (Signed)
01/23/2022 9:31 AM   Marc Burns Feb 10, 1946 [id number]  Referring provider: Practice, [facility] [place],  [place] [address]  Followuop urinary incontinence   HPI: Marc Burns is a 76yo here for followup for urinary incontinence. He underwent intravesical botox 100 units 2 weeks ago. His incontintencne has significantly improved and he uses 1 pad per day. No dysuria or pelvic pain. He continues to have an indwelling foley. No other complaints todsy   PMH: Past Medical History:  Diagnosis Date   Arthritis    Bladder neck contracture    Complication of anesthesia    Difficult intubation   Difficult intubation    GERD (gastroesophageal reflux disease)    History of gastric ulcer    2007   History of kidney stones    History of prostate cancer    2004--  S/P PROSTATECTOMY   Hypertension    Urinary incontinence    Wears glasses     Surgical History: Past Surgical History:  Procedure Laterality Date   BALLOON DILATION N/A 09/12/2013   Procedure: CYSTO BALLOON DILATION/RETROGRADE URETHROGRAM;  Surgeon: Reece Packer, MD;  Location: [facility];  Service: Urology;  Laterality: N/A;   BOTOX INJECTION N/A 01/08/2022   Procedure: BOTOX INJECTION;  Surgeon: Cleon Gustin, MD;  Location: [facility];  Service: Urology;  Laterality: N/A;   CYSTO/  URETHRAL DILATION/ BLADDER BX/ BILATERAL RETROGRADE PYELOGRAM  03-03-2010   CYSTO/ BALLOON DILATION AND VAPORIZATION BLADDER NECK & PROSTATIC FOSSA  04-28-2010   CYSTO/ BALLOON DILATION BLADDER NECK CONTRACTURE  07-23-2011;   11-17-2010;  08-08-2010   CYSTOSCOPY N/A 01/08/2022   Procedure: CYSTOSCOPY WITH INTRAVESICAL BOTOX 100 UNITS;  Surgeon: Cleon Gustin, MD;  Location: [facility];  Service: Urology;  Laterality: N/A;   CYSTOSCOPY WITH DIRECT VISION INTERNAL URETHROTOMY N/A 04/24/2021   Procedure: CYSTOSCOPY WITH DIRECT VISION INTERNAL URETHROTOMY;  Surgeon: Cleon Gustin, MD;  Location: [facility];   Service: Urology;  Laterality: N/A;   FULGURATION OF LESION  04/24/2021   Procedure: FULGURATION OF BLADDER NECK;  Surgeon: Cleon Gustin, MD;  Location: [facility];  Service: Urology;;   MITOMYCIN C INJECTION  04/24/2021   Procedure: MITOMYCIN C INJECTION ANTERIOR BLADDER WALL;  Surgeon: Cleon Gustin, MD;  Location: [facility];  Service: Urology;;   PROSTATECTOMY  2004   RADICAL   TONSILLECTOMY  AS CHILD   TRANSURETHRAL INCISION AND RESECTION OF BLADDER NECK CONTRACTURE  [phone]   UMBILICAL HERNIA REPAIR      Home Medications:  Allergies as of 01/23/2022       Reactions   Sulfa Antibiotics Itching, Rash        Medication List        Accurate as of January 23, 2022  9:31 AM. If you have any questions, ask your nurse or doctor.          amLODipine-benazepril 5-20 MG capsule Commonly known as: LOTREL Take 1 capsule by mouth every morning.   aspirin EC 81 MG tablet Take 81 mg by mouth daily. Swallow whole.   CENTRUM SILVER ADULT 50+ PO Take 1 tablet by mouth daily.   clotrimazole-betamethasone cream Commonly known as: Lotrisone Apply 1 application topically 2 (two) times daily. What changed:  when to take this reasons to take this   empagliflozin 25 MG Tabs tablet Commonly known as: JARDIANCE Take 25 mg by mouth daily.   furosemide 20 MG tablet Commonly known as: LASIX Take 20 mg by  mouth daily.   gabapentin 300 MG capsule Commonly known as: NEURONTIN Take 600 mg by mouth 2 (two) times daily.   Gemtesa 75 MG Tabs Generic drug: Vibegron Take 1 capsule by mouth daily.   ibuprofen 200 MG tablet Commonly known as: ADVIL Take 400 mg by mouth every 6 (six) hours as needed for moderate pain or headache.   metoprolol tartrate 25 MG tablet Commonly known as: LOPRESSOR Take 25 mg by mouth 2 (two) times daily.   mupirocin ointment 2 % Commonly known as: BACTROBAN Apply 1 application topically 2 (two) times daily as needed for wound care.   omeprazole 20  MG capsule Commonly known as: PRILOSEC Take 20 mg by mouth 2 (two) times daily.   Ozempic (0.25 or 0.5 MG/DOSE) 2 MG/1.5ML Sopn Generic drug: Semaglutide(0.25 or 0.5MG /DOS) Inject 0.5 mg into the skin every Sunday.   primidone 50 MG tablet Commonly known as: MYSOLINE Take 1 tablet (50 mg total) by mouth 2 (two) times daily.   rosuvastatin 10 MG tablet Commonly known as: CRESTOR Take 10 mg by mouth at bedtime.        Allergies:  Allergies  Allergen Reactions   Sulfa Antibiotics Itching and Rash    Family History: Family History  Problem Relation Age of Onset   Heart attack Mother    Heart attack Father     Social History:  reports that he has never smoked. He has never used smokeless tobacco. He reports current alcohol use of about 6.0 standard drinks per week. He reports that he does not use drugs.  ROS: All other review of systems were reviewed and are negative except what is noted above in HPI  Physical Exam: BP 121/70    Pulse 61   Constitutional:  Alert and oriented, No acute distress. HEENT: [place] AT, moist mucus membranes.  Trachea midline, no masses. Cardiovascular: No clubbing, cyanosis, or edema. Respiratory: Normal respiratory effort, no increased work of breathing. GI: Abdomen is soft, nontender, nondistended, no abdominal masses GU: No CVA tenderness.  Lymph: No cervical or inguinal lymphadenopathy. Skin: No rashes, bruises or suspicious lesions. Neurologic: Grossly intact, no focal deficits, moving all 4 extremities. Psychiatric: Normal mood and affect.  Laboratory Data: Lab Results  Component Value Date   WBC 9.4 11/17/2010   HGB 15.0 09/12/2013   HCT 44.0 09/12/2013   MCV 87.8 11/17/2010   PLT 138 (L) 11/17/2010    Lab Results  Component Value Date   CREATININE 0.81 01/06/2022    No results found for: PSA  No results found for: TESTOSTERONE  Lab Results  Component Value Date   HGBA1C 7.2 (H) 04/22/2021    Urinalysis    Component  Value Date/Time   COLORURINE YELLOW 02/26/2010 0930   APPEARANCEUR CLOUDY (A) 02/26/2010 0930   LABSPEC 1.019 02/26/2010 0930   PHURINE 5.5 02/26/2010 0930   GLUCOSEU NEGATIVE 02/26/2010 0930   HGBUR LARGE (A) 02/26/2010 0930   BILIRUBINUR NEGATIVE 02/26/2010 0930   KETONESUR NEGATIVE 02/26/2010 0930   PROTEINUR 30 (A) 02/26/2010 0930   UROBILINOGEN 0.2 02/26/2010 0930   NITRITE NEGATIVE 02/26/2010 0930   LEUKOCYTESUR SMALL (A) 02/26/2010 0930    Lab Results  Component Value Date   BACTERIA FEW (A) 02/26/2010    Pertinent Imaging:  No results found for this or any previous visit.  No results found for this or any previous visit.  No results found for this or any previous visit.  No results found for this or any previous visit.  Results for orders placed during the hospital encounter of 07/20/17  US Renal  Narrative CLINICAL DATA:  76 year old male with urinary retention. Self catheterization. Initial encounter.  EXAM: RENAL / URINARY TRACT ULTRASOUND COMPLETE  COMPARISON:  None.  FINDINGS: Right Kidney:  Length: 11.3 cm. Echogenicity within normal limits. No mass or hydronephrosis visualized.  Left Kidney:  Length: 11.8 cm. Echogenicity within normal limits. No mass or hydronephrosis visualized.  Bladder:  Decompressed and not adequately assessed  IMPRESSION: Kidneys unremarkable  Bladder decompressed and not assessed   Electronically Signed By: Genia Del M.D. On: 07/20/2017 12:25  No results found for this or any previous visit.  No results found for this or any previous visit.  No results found for this or any previous visit.   Assessment & Plan:    1. Urinary incontinence without sensory awareness -RTC 3-4 months  - Urinalysis, Routine w reflex microscopic   No follow-ups on file.  Nicolette Bang, MD  [facility] [place]

## 2022-01-26 DIAGNOSIS — E7800 Pure hypercholesterolemia, unspecified: Secondary | ICD-10-CM | POA: Diagnosis not present

## 2022-01-26 DIAGNOSIS — E7849 Other hyperlipidemia: Secondary | ICD-10-CM | POA: Diagnosis not present

## 2022-01-26 DIAGNOSIS — E7801 Familial hypercholesterolemia: Secondary | ICD-10-CM | POA: Diagnosis not present

## 2022-01-26 DIAGNOSIS — E1169 Type 2 diabetes mellitus with other specified complication: Secondary | ICD-10-CM | POA: Diagnosis not present

## 2022-01-26 DIAGNOSIS — E1165 Type 2 diabetes mellitus with hyperglycemia: Secondary | ICD-10-CM | POA: Diagnosis not present

## 2022-01-26 DIAGNOSIS — E782 Mixed hyperlipidemia: Secondary | ICD-10-CM | POA: Diagnosis not present

## 2022-01-26 DIAGNOSIS — Z125 Encounter for screening for malignant neoplasm of prostate: Secondary | ICD-10-CM | POA: Diagnosis not present

## 2022-01-26 DIAGNOSIS — K219 Gastro-esophageal reflux disease without esophagitis: Secondary | ICD-10-CM | POA: Diagnosis not present

## 2022-01-26 DIAGNOSIS — Z1329 Encounter for screening for other suspected endocrine disorder: Secondary | ICD-10-CM | POA: Diagnosis not present

## 2022-01-29 DIAGNOSIS — Z6838 Body mass index (BMI) 38.0-38.9, adult: Secondary | ICD-10-CM | POA: Diagnosis not present

## 2022-01-29 DIAGNOSIS — E782 Mixed hyperlipidemia: Secondary | ICD-10-CM | POA: Diagnosis not present

## 2022-01-29 DIAGNOSIS — I10 Essential (primary) hypertension: Secondary | ICD-10-CM | POA: Diagnosis not present

## 2022-01-29 DIAGNOSIS — Z1389 Encounter for screening for other disorder: Secondary | ICD-10-CM | POA: Diagnosis not present

## 2022-01-29 DIAGNOSIS — Z0001 Encounter for general adult medical examination with abnormal findings: Secondary | ICD-10-CM | POA: Diagnosis not present

## 2022-01-29 DIAGNOSIS — G250 Essential tremor: Secondary | ICD-10-CM | POA: Diagnosis not present

## 2022-01-29 DIAGNOSIS — E1140 Type 2 diabetes mellitus with diabetic neuropathy, unspecified: Secondary | ICD-10-CM | POA: Diagnosis not present

## 2022-01-29 DIAGNOSIS — Z6839 Body mass index (BMI) 39.0-39.9, adult: Secondary | ICD-10-CM | POA: Diagnosis not present

## 2022-01-29 DIAGNOSIS — E1169 Type 2 diabetes mellitus with other specified complication: Secondary | ICD-10-CM | POA: Diagnosis not present

## 2022-01-29 DIAGNOSIS — E7849 Other hyperlipidemia: Secondary | ICD-10-CM | POA: Diagnosis not present

## 2022-01-29 DIAGNOSIS — E7800 Pure hypercholesterolemia, unspecified: Secondary | ICD-10-CM | POA: Diagnosis not present

## 2022-01-29 DIAGNOSIS — Z1331 Encounter for screening for depression: Secondary | ICD-10-CM | POA: Diagnosis not present

## 2022-03-20 DIAGNOSIS — E7849 Other hyperlipidemia: Secondary | ICD-10-CM | POA: Diagnosis not present

## 2022-03-20 DIAGNOSIS — E1140 Type 2 diabetes mellitus with diabetic neuropathy, unspecified: Secondary | ICD-10-CM | POA: Diagnosis not present

## 2022-03-20 DIAGNOSIS — I10 Essential (primary) hypertension: Secondary | ICD-10-CM | POA: Diagnosis not present

## 2022-04-28 ENCOUNTER — Encounter: Payer: Self-pay | Admitting: Urology

## 2022-04-28 ENCOUNTER — Ambulatory Visit (INDEPENDENT_AMBULATORY_CARE_PROVIDER_SITE_OTHER): Payer: Medicare Other | Admitting: Urology

## 2022-04-28 VITALS — BP 138/79 | HR 63

## 2022-04-28 DIAGNOSIS — R32 Unspecified urinary incontinence: Secondary | ICD-10-CM

## 2022-04-28 MED ORDER — CLOTRIMAZOLE-BETAMETHASONE 1-0.05 % EX CREA: 1.0000 "application " | TOPICAL_CREAM | Freq: Two times a day (BID) | CUTANEOUS | 0 refills | Status: DC

## 2022-04-28 MED ORDER — CLOTRIMAZOLE-BETAMETHASONE 1-0.05 % EX CREA: 1.0000 "application " | TOPICAL_CREAM | Freq: Two times a day (BID) | CUTANEOUS | 3 refills | Status: DC | PRN

## 2022-04-28 NOTE — Progress Notes (Signed)
? ?04/28/2022 ?9:18 AM  ? ?Marc Burns ?04/12/1946 ?[id number] ? ?Referring provider: Practice, [facility] ?[place] ?[place],  [place] [address] ? ?Urinary incontinence ? ? ?HPI: ?Marc Burns is a 76yo here for followup for urinary incontinence. He underwent intravesical botox 4 months ago. He has occasional urinary incontinence. He uses 3 pads per day. He has occasional leakage around the catheter. Bladder spasms resolved. Since botox. No other complaints today.  ? ? ?PMH: ?Past Medical History:  ?Diagnosis Date  ? Arthritis   ? Bladder neck contracture   ? Complication of anesthesia   ? Difficult intubation  ? Difficult intubation   ? GERD (gastroesophageal reflux disease)   ? History of gastric ulcer   ? 2007  ? History of kidney stones   ? History of prostate cancer   ? 2004--  S/P PROSTATECTOMY  ? Hypertension   ? Urinary incontinence   ? Wears glasses   ? ? ?Surgical History: ?Past Surgical History:  ?Procedure Laterality Date  ? BALLOON DILATION N/A 09/12/2013  ? Procedure: CYSTO BALLOON DILATION/RETROGRADE URETHROGRAM;  Surgeon: Reece Packer, MD;  [facility];  Service: Urology;  Laterality: N/A;  ? BOTOX INJECTION N/A 01/08/2022  ? Procedure: BOTOX INJECTION;  Surgeon: Cleon Gustin, MD;  Location: [facility];  Service: Urology;  Laterality: N/A;  ? CYSTO/  URETHRAL DILATION/ BLADDER BX/ BILATERAL RETROGRADE PYELOGRAM  03-03-2010  ? CYSTO/ BALLOON DILATION AND VAPORIZATION BLADDER NECK & PROSTATIC FOSSA  04-28-2010  ? CYSTO/ BALLOON DILATION BLADDER NECK CONTRACTURE  07-23-2011;   11-17-2010;  08-08-2010  ? CYSTOSCOPY N/A 01/08/2022  ? Procedure: CYSTOSCOPY WITH INTRAVESICAL BOTOX 100 UNITS;  Surgeon: Cleon Gustin, MD;  Location: [facility];  Service: Urology;  Laterality: N/A;  ? CYSTOSCOPY WITH DIRECT VISION INTERNAL URETHROTOMY N/A 04/24/2021  ? Procedure: CYSTOSCOPY WITH DIRECT VISION INTERNAL URETHROTOMY;  Surgeon: Cleon Gustin, MD;  Location: [facility];  Service:  Urology;  Laterality: N/A;  ? FULGURATION OF LESION  04/24/2021  ? Procedure: FULGURATION OF BLADDER NECK;  Surgeon: Cleon Gustin, MD;  Location: [facility];  Service: Urology;;  ? MITOMYCIN C INJECTION  04/24/2021  ? Procedure: MITOMYCIN C INJECTION ANTERIOR BLADDER WALL;  Surgeon: Cleon Gustin, MD;  Location: [facility];  Service: Urology;;  ? PROSTATECTOMY  2004  ? RADICAL  ? TONSILLECTOMY  AS CHILD  ? TRANSURETHRAL INCISION AND RESECTION OF BLADDER NECK CONTRACTURE  09-01-2010  ? UMBILICAL HERNIA REPAIR    ? ? ?Home Medications:  ?Allergies as of 04/28/2022   ? ?   Reactions  ? Sulfa Antibiotics Itching, Rash  ? ?  ? ?  ?Medication List  ?  ? ?  ? Accurate as of Apr 28, 2022  9:18 AM. If you have any questions, ask your nurse or doctor.  ?  ?  ? ?  ? ?amLODipine-benazepril 5-20 MG capsule ?Commonly known as: LOTREL ?Take 1 capsule by mouth every morning. ?  ?aspirin EC 81 MG tablet ?Take 81 mg by mouth daily. Swallow whole. ?  ?CENTRUM SILVER ADULT 50+ PO ?Take 1 tablet by mouth daily. ?  ?clotrimazole-betamethasone cream ?Commonly known as: Lotrisone ?Apply 1 application topically 2 (two) times daily. ?What changed:  ?when to take this ?reasons to take this ?  ?empagliflozin 25 MG Tabs tablet ?Commonly known as: JARDIANCE ?Take 25 mg by mouth daily. ?  ?furosemide 20 MG tablet ?Commonly known as: LASIX ?Take 20 mg by mouth daily. ?  ?  gabapentin 300 MG capsule ?Commonly known as: NEURONTIN ?Take 600 mg by mouth 2 (two) times daily. ?  ?Gemtesa 75 MG Tabs ?Generic drug: Vibegron ?Take 1 capsule by mouth daily. ?  ?ibuprofen 200 MG tablet ?Commonly known as: ADVIL ?Take 400 mg by mouth every 6 (six) hours as needed for moderate pain or headache. ?  ?metoprolol tartrate 25 MG tablet ?Commonly known as: LOPRESSOR ?Take 25 mg by mouth 2 (two) times daily. ?  ?mupirocin ointment 2 % ?Commonly known as: BACTROBAN ?Apply 1 application topically 2 (two) times daily as needed for wound care. ?  ?omeprazole 20 MG  capsule ?Commonly known as: PRILOSEC ?Take 20 mg by mouth 2 (two) times daily. ?  ?oxybutynin 10 MG 24 hr tablet ?Commonly known as: DITROPAN-XL ?Take by mouth. ?  ?Ozempic (0.25 or 0.5 MG/DOSE) 2 MG/1.5ML Sopn ?Generic drug: Semaglutide(0.25 or 0.'5MG'$ /DOS) ?Inject 0.5 mg into the skin every Sunday. ?  ?primidone 50 MG tablet ?Commonly known as: MYSOLINE ?Take 1 tablet (50 mg total) by mouth 2 (two) times daily. ?  ?rosuvastatin 10 MG tablet ?Commonly known as: CRESTOR ?Take 10 mg by mouth at bedtime. ?  ? ?  ? ? ?Allergies:  ?Allergies  ?Allergen Reactions  ? Sulfa Antibiotics Itching and Rash  ? ? ?Family History: ?Family History  ?Problem Relation Age of Onset  ? Heart attack Mother   ? Heart attack Father   ? ? ?Social History:  reports that he has never smoked. He has never used smokeless tobacco. He reports current alcohol use of about 6.0 standard drinks per week. He reports that he does not use drugs. ? ?ROS: ?All other review of systems were reviewed and are negative except what is noted above in HPI ? ?Physical Exam: ?BP 138/79   Pulse 63   ?Constitutional:  Alert and oriented, No acute distress. ?HEENT: [place] AT, moist mucus membranes.  Trachea midline, no masses. ?Cardiovascular: No clubbing, cyanosis, or edema. ?Respiratory: Normal respiratory effort, no increased work of breathing. ?GI: Abdomen is soft, nontender, nondistended, no abdominal masses ?GU: No CVA tenderness.  ?Lymph: No cervical or inguinal lymphadenopathy. ?Skin: No rashes, bruises or suspicious lesions. ?Neurologic: Grossly intact, no focal deficits, moving all 4 extremities. ?Psychiatric: Normal mood and affect. ? ?Laboratory Data: ?Lab Results  ?Component Value Date  ? WBC 9.4 11/17/2010  ? HGB 15.0 09/12/2013  ? HCT 44.0 09/12/2013  ? MCV 87.8 11/17/2010  ? PLT 138 (L) 11/17/2010  ? ? ?Lab Results  ?Component Value Date  ? CREATININE 0.81 01/06/2022  ? ? ?No results found for: PSA ? ?No results found for: TESTOSTERONE ? ?Lab Results   ?Component Value Date  ? HGBA1C 7.2 (H) 04/22/2021  ? ? ?Urinalysis ?   ?Component Value Date/Time  ? COLORURINE YELLOW 02/26/2010 0930  ? APPEARANCEUR CLOUDY (A) 02/26/2010 0930  ? LABSPEC 1.019 02/26/2010 0930  ? PHURINE 5.5 02/26/2010 0930  ? GLUCOSEU NEGATIVE 02/26/2010 0930  ? HGBUR LARGE (A) 02/26/2010 0930  ? BILIRUBINUR NEGATIVE 02/26/2010 0930  ? KETONESUR NEGATIVE 02/26/2010 0930  ? PROTEINUR 30 (A) 02/26/2010 0930  ? UROBILINOGEN 0.2 02/26/2010 0930  ? NITRITE NEGATIVE 02/26/2010 0930  ? LEUKOCYTESUR SMALL (A) 02/26/2010 0930  ? ? ?Lab Results  ?Component Value Date  ? BACTERIA FEW (A) 02/26/2010  ? ? ?Pertinent Imaging: ? ?No results found for this or any previous visit. ? ?No results found for this or any previous visit. ? ?No results found for this or any previous visit. ? ?  No results found for this or any previous visit. ? ?Results for orders placed during the hospital encounter of 07/20/17 ? ?US Renal ? ?Narrative ?CLINICAL DATA:  76 year old male with urinary retention. Self ?catheterization. Initial encounter. ? ?EXAM: ?RENAL / URINARY TRACT ULTRASOUND COMPLETE ? ?COMPARISON:  None. ? ?FINDINGS: ?Right Kidney: ? ?Length: 11.3 cm. Echogenicity within normal limits. No mass or ?hydronephrosis visualized. ? ?Left Kidney: ? ?Length: 11.8 cm. Echogenicity within normal limits. No mass or ?hydronephrosis visualized. ? ?Bladder: ? ?Decompressed and not adequately assessed ? ?IMPRESSION: ?Kidneys unremarkable ? ?Bladder decompressed and not assessed ? ? ?Electronically Signed ?By: Genia Del M.D. ?On: 07/20/2017 12:25 ? ?No results found for this or any previous visit. ? ?No results found for this or any previous visit. ? ?No results found for this or any previous visit. ? ? ?Assessment & Plan:   ? ?1. Urinary incontinence, unspecified type ?-RTC 2 ?- Urinalysis, Routine w reflex microscopic ? ? ?No follow-ups on file. ? ?Nicolette Bang, MD ? ?[facility] [place] ?  ?

## 2022-04-28 NOTE — Patient Instructions (Signed)
Urinary Incontinence Urinary incontinence refers to a condition in which a person is unable to control where and when to pass urine. A person with this condition will urinate involuntarily. This means that the person urinates when he or she does not mean to. What are the causes? This condition may be caused by: Medicines. Infections. Constipation. Overactive bladder muscles. Weak bladder muscles. Weak pelvic floor muscles. These muscles provide support for the bladder, intestine, and, in women, the uterus. Enlarged prostate in men. The prostate is a gland near the bladder. When it gets too big, it can pinch the urethra. With the urethra blocked, the bladder can weaken and lose the ability to empty properly. Surgery. Emotional factors, such as anxiety, stress, or post-traumatic stress disorder (PTSD). Spinal cord injury, nerve injury, or other neurological conditions. Pelvic organ prolapse. This happens in women when organs move out of place and into the vagina. This movement can prevent the bladder and urethra from working properly. What increases the risk? The following factors may make you more likely to develop this condition: Age. The older you are, the higher the risk. Obesity. Being physically inactive. Pregnancy and childbirth. Menopause. Diseases that affect the nerves or spinal cord. Long-term, or chronic, coughing. This can increase pressure on the bladder and pelvic floor muscles. What are the signs or symptoms? Symptoms may vary depending on the type of urinary incontinence you have. They include: A sudden urge to urinate, and passing urine involuntarily before you can get to a bathroom (urge incontinence). Suddenly passing urine when doing activities that force urine to pass, such as coughing, laughing, exercising, or sneezing (stress incontinence). Needing to urinate often but urinating only a small amount, or constantly dribbling urine (overflow incontinence). Urinating  because you cannot get to the bathroom in time due to a physical disability, such as arthritis or injury, or due to a communication or thinking problem, such as Alzheimer's disease (functional incontinence). How is this diagnosed? This condition may be diagnosed based on: Your medical history. A physical exam. Tests, such as: Urine tests. X-rays of your kidney and bladder. Ultrasound. CT scan. Cystoscopy. In this procedure, a health care provider inserts a tube with a light and camera (cystoscope) through the urethra and into the bladder to check for problems. Urodynamic testing. These tests assess how well the bladder, urethra, and sphincter can store and release urine. There are different types of urodynamic tests, and they vary depending on what the test is measuring. To help diagnose your condition, your health care provider may recommend that you keep a log of when you urinate and how much you urinate. How is this treated? Treatment for this condition depends on the type of incontinence that you have and its cause. Treatment may include: Lifestyle changes, such as: Quitting smoking. Maintaining a healthy weight. Staying active. Try to get 150 minutes of moderate-intensity exercise every week. Ask your health care provider which activities are safe for you. Eating a healthy diet. Avoid high-fat foods, like fried foods. Avoid refined carbohydrates like white bread and white rice. Limit how much alcohol and caffeine you drink. Increase your fiber intake. Healthy sources of fiber include beans, whole grains, and fresh fruits and vegetables. Behavioral changes, such as: Pelvic floor muscle exercises. Bladder training, such as lengthening the amount of time between bathroom breaks, or using the bathroom at regular intervals. Using techniques to suppress bladder urges. This can include distraction techniques or controlled breathing exercises. Medicines, such as: Medicines to relax the  bladder   muscles and prevent bladder spasms. Medicines to help slow or prevent the growth of a man's prostate. Botox injections. These can help relax the bladder muscles. Treatments, such as: Using pulses of electricity to help change bladder reflexes (electrical nerve stimulation). For women, using a medical device to prevent urine leaks. This is a small, tampon-like, disposable device that is inserted into the urethra. Injecting collagen or carbon beads (bulking agents) into the urinary sphincter. These can help thicken tissue and close the bladder opening. Surgery. Follow these instructions at home: Lifestyle Limit alcohol and caffeine. These can fill your bladder quickly and irritate it. Keep yourself clean to help prevent odors and skin damage. Ask your health care provider about special skin creams and cleansers that can protect the skin from urine. Consider wearing pads or adult diapers. Make sure to change them regularly, and always change them right after experiencing incontinence. General instructions Take over-the-counter and prescription medicines only as told by your health care provider. Use the bathroom about every 3-4 hours, even if you do not feel the need to urinate. Try to empty your bladder completely every time. After urinating, wait a minute. Then try to urinate again. Make sure you are in a relaxed position while urinating. If your incontinence is caused by nerve problems, keep a log of the medicines you take and the times you go to the bathroom. Keep all follow-up visits. This is important. Where to find more information National Institute of Diabetes and Digestive and Kidney Diseases: www.niddk.nih.gov American Urology Association: www.urologyhealth.org Contact a health care provider if: You have pain that gets worse. Your incontinence gets worse. Get help right away if: You have a fever or chills. You are unable to urinate. You have redness in your groin area or  down your legs. Summary Urinary incontinence refers to a condition in which a person is unable to control where and when to pass urine. This condition may be caused by medicines, infection, weak bladder muscles, weak pelvic floor muscles, enlargement of the prostate (in men), or surgery. Factors such as older age, obesity, pregnancy and childbirth, menopause, neurological diseases, and chronic coughing may increase your risk for developing this condition. Types of urinary incontinence include urge incontinence, stress incontinence, overflow incontinence, and functional incontinence. This condition is usually treated first with lifestyle and behavioral changes, such as quitting smoking, eating a healthier diet, and doing regular pelvic floor exercises. Other treatment options include medicines, bulking agents, medical devices, electrical nerve stimulation, or surgery. This information is not intended to replace advice given to you by your health care provider. Make sure you discuss any questions you have with your health care provider. Document Revised: 07/12/2020 Document Reviewed: 07/12/2020 Elsevier Patient Education  2023 [employer] Inc.  

## 2022-05-14 DIAGNOSIS — Z23 Encounter for immunization: Secondary | ICD-10-CM | POA: Diagnosis not present

## 2022-05-21 NOTE — Patient Instructions (Signed)
Below is our plan:  We will continue primidone '50mg'$  twice daily.   Please make sure you are staying well hydrated. I recommend 50-60 ounces daily. Well balanced diet and regular exercise encouraged. Consistent sleep schedule with 6-8 hours recommended.   Please continue follow up with care team as directed.   Follow up with me in 1 year   You may receive a survey regarding today's visit. I encourage you to leave honest feed back as I do use this information to improve patient care. Thank you for seeing me today!

## 2022-05-21 NOTE — Progress Notes (Signed)
Chief Complaint  Patient presents with   Follow-up    Rm 10, alone. Here for 6 month f/u for tremor. Pt reports no change since last OV.       HISTORY OF PRESENT ILLNESS:  05/25/22 ALL:  Marc Burns returns for follow up for essential tremor. He was seen last 11/2021 and primidone was increased to 25-52m in am and 54mat bedtime. Since, he reports doing well. He is tolerating primidone without obvious adverse effects. Tremor waxes and wanes but he feels it is fairly mild and stable on current dose of primidone. Labs are followed by PCP.   11/20/2021 ALL:  Marc GULINOs a [age]. [age] here today for follow up for bilateral hand tremor. He seen in consult with Dr AtRexene Alberts/2022. Tremor felt to be more consistent with an essential tremor and he was started on primidone 5064mt bedtime. He has tolerated medication well. No obvious adverse effects. He is not certain tremor has improved but does not feel it is worse. He has difficulty hold a plate or cup. It is worse when anxious or tired. No resting tremor noted. He is feeling well today and without concerns.    HISTORY (copied from Dr AthGuadelupe Sabinevious note)  Dear Dr. WilJimmye Norman I saw your patient, Marc Burns your kind request in my neurologic clinic today for initial consultation of his hand tremor.  The patient is accompanied by his daughter, SusManuela Schwartzoday.  As you know, Mr. HarGaede a [age] old right-handed gentleman with an underlying medical history of hypertension, diabetes, hyperlipidemia, pressure sore, reflux disease, history of gastric ulcer, history of kidney stones, prostate cancer, and obesity, who reports an approximately 1+ year history of bilateral hand tremors.  He has noted the tremor primarily when he tries to hold a cup or feed himself.  Tremor is bilateral and mostly with holding and doing something with fine motor skills involved, not at rest.  He has not noticed any tremor in the upper body or legs.  He is not  aware of any family history of tremors.  He is an only child.  Parents lived to be in their mid to late 70s39sd both had heart problems.  He has 3 grown children, none with tremors.  There is no obvious trigger or alleviating factor.  He has not had any recent medication changes.  Some days the tremor is hardly noticeable and some other days it is quite significant and bothersome.  He has been worried about having something like Parkinson's disease.  He limits his caffeine to 2 cups of coffee in the morning but does drink some soda 1 or 2 small bottles per day on average.  He tries to hydrate well.  He has tried to exercise his upper body with small weights, up to 5 pounds but did not notice any improvement in his tremor.  He denies sudden onset of one-sided weakness or numbness or tingling or droopy face or slurring of speech, denies any recurrent headaches. I reviewed your office note from 04/29/2021.  He has been on metoprolol.  He has been on gabapentin for diabetic neuropathy and is currently on 200 mg twice daily.  His last A1c in February 2022 was 7.4.   REVIEW OF SYSTEMS: Out of a complete 14 system review of symptoms, the patient complains only of the following symptoms, tremor and all other reviewed systems are negative.   ALLERGIES: Allergies  Allergen Reactions   Sulfa Antibiotics  Itching and Rash     HOME MEDICATIONS: Outpatient Medications Prior to Visit  Medication Sig Dispense Refill   amLODipine-benazepril (LOTREL) 5-20 MG per capsule Take 1 capsule by mouth every morning.     aspirin EC 81 MG tablet Take 81 mg by mouth daily. Swallow whole.     clotrimazole-betamethasone (LOTRISONE) cream Apply 1 application. topically 2 (two) times daily. 30 g 0   clotrimazole-betamethasone (LOTRISONE) cream Apply 1 application. topically 2 (two) times daily as needed (itching). 30 g 3   empagliflozin (JARDIANCE) 25 MG TABS tablet Take 25 mg by mouth daily.     furosemide (LASIX) 20 MG tablet  Take 20 mg by mouth daily.     gabapentin (NEURONTIN) 300 MG capsule Take 600 mg by mouth 2 (two) times daily.     ibuprofen (ADVIL) 200 MG tablet Take 400 mg by mouth every 6 (six) hours as needed for moderate pain or headache.     metoprolol tartrate (LOPRESSOR) 25 MG tablet Take 25 mg by mouth 2 (two) times daily.     Multiple Vitamins-Minerals (CENTRUM SILVER ADULT 50+ PO) Take 1 tablet by mouth daily.     mupirocin ointment (BACTROBAN) 2 % Apply 1 application topically 2 (two) times daily as needed for wound care.     omeprazole (PRILOSEC) 20 MG capsule Take 20 mg by mouth 2 (two) times daily.     oxybutynin (DITROPAN-XL) 10 MG 24 hr tablet Take by mouth.     OZEMPIC, 0.25 OR 0.5 MG/DOSE, 2 MG/1.5ML SOPN Inject 0.5 mg into the skin every Sunday.     rosuvastatin (CRESTOR) 10 MG tablet Take 10 mg by mouth at bedtime.     primidone (MYSOLINE) 50 MG tablet Take 1 tablet (50 mg total) by mouth 2 (two) times daily. 60 tablet 5   Vibegron (GEMTESA) 75 MG TABS Take 1 capsule by mouth daily. 30 tablet 11   No facility-administered medications prior to visit.     PAST MEDICAL HISTORY: Past Medical History:  Diagnosis Date   Arthritis    Bladder neck contracture    Complication of anesthesia    Difficult intubation   Difficult intubation    GERD (gastroesophageal reflux disease)    History of gastric ulcer    2007   History of kidney stones    History of prostate cancer    2004--  S/P PROSTATECTOMY   Hypertension    Urinary incontinence    Wears glasses      PAST SURGICAL HISTORY: Past Surgical History:  Procedure Laterality Date   BALLOON DILATION N/A 09/12/2013   Procedure: CYSTO BALLOON DILATION/RETROGRADE URETHROGRAM;  Surgeon: Reece Packer, MD;  [facility];  Service: Urology;  Laterality: N/A;   BOTOX INJECTION N/A 01/08/2022   Procedure: BOTOX INJECTION;  Surgeon: Cleon Gustin, MD;  Location: [facility];  Service: Urology;  Laterality: N/A;    CYSTO/  URETHRAL DILATION/ BLADDER BX/ BILATERAL RETROGRADE PYELOGRAM  03-03-2010   CYSTO/ BALLOON DILATION AND VAPORIZATION BLADDER NECK & PROSTATIC FOSSA  04-28-2010   CYSTO/ BALLOON DILATION BLADDER NECK CONTRACTURE  07-23-2011;   11-17-2010;  08-08-2010   CYSTOSCOPY N/A 01/08/2022   Procedure: CYSTOSCOPY WITH INTRAVESICAL BOTOX 100 UNITS;  Surgeon: Cleon Gustin, MD;  Location: [facility];  Service: Urology;  Laterality: N/A;   CYSTOSCOPY WITH DIRECT VISION INTERNAL URETHROTOMY N/A 04/24/2021   Procedure: CYSTOSCOPY WITH DIRECT VISION INTERNAL URETHROTOMY;  Surgeon: Cleon Gustin, MD;  Location: [facility];  Service:  Urology;  Laterality: N/A;   FULGURATION OF LESION  04/24/2021   Procedure: FULGURATION OF BLADDER NECK;  Surgeon: Cleon Gustin, MD;  Location: [facility];  Service: Urology;;   MITOMYCIN C INJECTION  04/24/2021   Procedure: MITOMYCIN C INJECTION ANTERIOR BLADDER WALL;  Surgeon: Cleon Gustin, MD;  Location: [facility];  Service: Urology;;   PROSTATECTOMY  2004   RADICAL   TONSILLECTOMY  AS CHILD   TRANSURETHRAL INCISION AND RESECTION OF BLADDER NECK CONTRACTURE  23-30-0762   UMBILICAL HERNIA REPAIR       FAMILY HISTORY: Family History  Problem Relation Age of Onset   Heart attack Mother    Heart attack Father      SOCIAL HISTORY: Social History   Socioeconomic History   Marital status: Married    Spouse name: Arville Go   Number of children: Not on file   Years of education: Not on file   Highest education level: Not on file  Occupational History   Not on file  Tobacco Use   Smoking status: Never   Smokeless tobacco: Never  Substance and Sexual Activity   Alcohol use: Yes    Alcohol/week: 6.0 standard drinks    Types: 6 Cans of beer per week   Drug use: No   Sexual activity: Not on file  Other Topics Concern   Not on file  Social History Narrative   Not on file   Social Determinants of Health   Financial Resource Strain: Not on file  Food  Insecurity: Not on file  Transportation Needs: Not on file  Physical Activity: Not on file  Stress: Not on file  Social Connections: Not on file  Intimate Partner Violence: Not on file     PHYSICAL EXAM  Vitals:   05/25/22 1049  BP: (!) 157/68  Pulse: 65  Weight: 260 lb 8 oz (118.2 kg)  Height: '5\' 8"'  (1.727 m)    Body mass index is 39.61 kg/m.  Generalized: Well developed, in no acute distress  Cardiology: normal rate and rhythm, no murmur auscultated  Respiratory: clear to auscultation bilaterally    Neurological examination  Mentation: Alert oriented to time, place, history taking. Follows all commands speech and language fluent Cranial nerve II-XII: Pupils were equal round reactive to light. Extraocular movements were full, visual field were full on confrontational test. Facial sensation and strength were normal. Head turning and shoulder shrug  were normal and symmetric. Motor: The motor testing reveals 5 over 5 strength of all 4 extremities. Good symmetric motor tone is noted throughout. Mild bilateral hand tremor noted with exam.  Gait and station: Gait is normal.    DIAGNOSTIC DATA (LABS, IMAGING, TESTING) - I reviewed patient records, labs, notes, testing and imaging myself where available.  Lab Results  Component Value Date   WBC 9.4 11/17/2010   HGB 15.0 09/12/2013   HCT 44.0 09/12/2013   MCV 87.8 11/17/2010   PLT 138 (L) 11/17/2010      Component Value Date/Time   NA 139 01/06/2022 1158   NA 139 07/24/2021 1042   K 4.4 01/06/2022 1158   CL 104 01/06/2022 1158   CO2 23 01/06/2022 1158   GLUCOSE 123 (H) 01/06/2022 1158   BUN 20 01/06/2022 1158   BUN 15 07/24/2021 1042   CREATININE 0.81 01/06/2022 1158   CALCIUM 9.1 01/06/2022 1158   PROT 7.2 07/21/2021 1123   ALBUMIN 4.8 (H) 07/21/2021 1123   AST 21 07/21/2021 1123   ALT 19 07/21/2021 1123  ALKPHOS 62 07/21/2021 1123   BILITOT 0.5 07/21/2021 1123   GFRNONAA >60 01/06/2022 1158   GFRAA   11/17/2010 1403    >60        The eGFR has been calculated using the MDRD equation. This calculation has not been validated in all clinical situations. eGFR's persistently <60 mL/min signify possible Chronic Kidney Disease.   No results found for: CHOL, HDL, LDLCALC, LDLDIRECT, TRIG, CHOLHDL Lab Results  Component Value Date   HGBA1C 7.2 (H) 04/22/2021   No results found for: [id number] Lab Results  Component Value Date   TSH 1.720 07/21/2021        View : No data to display.               View : No data to display.           ASSESSMENT AND PLAN  76 y.o. year old male  has a past medical history of Arthritis, Bladder neck contracture, Complication of anesthesia, Difficult intubation, GERD (gastroesophageal reflux disease), History of gastric ulcer, History of kidney stones, History of prostate cancer, Hypertension, Urinary incontinence, and Wears glasses. here with    Essential tremor  Marc Burns has tolerated primidone 3m rwice daily without adverse effects. Tremor is stable. He will continue current treatment plan. Healthy lifestyle habits encouraged. He will continue follow up with PCP as directed. Labs reviewed in Epic for today's visit. He will follow up with me in 6 months.    No orders of the defined types were placed in this encounter.     Meds ordered this encounter  Medications   primidone (MYSOLINE) 50 MG tablet    Sig: Take 1 tablet (50 mg total) by mouth 2 (two) times daily.    Dispense:  180 tablet    Refill:  3    Order Specific Question:   Supervising Provider    Answer:   AMelvenia Beam[[[id number]      ADebbora Presto MSN, FNP-C 05/25/2022, 11:16 AM  [facility] [address] [place]  [phone]

## 2022-05-25 ENCOUNTER — Ambulatory Visit (INDEPENDENT_AMBULATORY_CARE_PROVIDER_SITE_OTHER): Payer: Medicare Other | Admitting: Family Medicine

## 2022-05-25 ENCOUNTER — Encounter: Payer: Self-pay | Admitting: Family Medicine

## 2022-05-25 VITALS — BP 157/68 | HR 65 | Ht 68.0 in | Wt 260.5 lb

## 2022-05-25 DIAGNOSIS — G250 Essential tremor: Secondary | ICD-10-CM

## 2022-05-25 MED ORDER — PRIMIDONE 50 MG PO TABS: 50.0000 mg | ORAL_TABLET | Freq: Two times a day (BID) | ORAL | 3 refills | Status: DC

## 2022-06-11 DIAGNOSIS — H2513 Age-related nuclear cataract, bilateral: Secondary | ICD-10-CM | POA: Diagnosis not present

## 2022-06-30 ENCOUNTER — Other Ambulatory Visit: Payer: Self-pay | Admitting: Urology

## 2022-07-23 DIAGNOSIS — E7801 Familial hypercholesterolemia: Secondary | ICD-10-CM | POA: Diagnosis not present

## 2022-07-23 DIAGNOSIS — K219 Gastro-esophageal reflux disease without esophagitis: Secondary | ICD-10-CM | POA: Diagnosis not present

## 2022-07-23 DIAGNOSIS — E1169 Type 2 diabetes mellitus with other specified complication: Secondary | ICD-10-CM | POA: Diagnosis not present

## 2022-07-23 DIAGNOSIS — R739 Hyperglycemia, unspecified: Secondary | ICD-10-CM | POA: Diagnosis not present

## 2022-07-23 DIAGNOSIS — E039 Hypothyroidism, unspecified: Secondary | ICD-10-CM | POA: Diagnosis not present

## 2022-07-23 DIAGNOSIS — D519 Vitamin B12 deficiency anemia, unspecified: Secondary | ICD-10-CM | POA: Diagnosis not present

## 2022-07-23 DIAGNOSIS — E7800 Pure hypercholesterolemia, unspecified: Secondary | ICD-10-CM | POA: Diagnosis not present

## 2022-07-23 DIAGNOSIS — E7849 Other hyperlipidemia: Secondary | ICD-10-CM | POA: Diagnosis not present

## 2022-07-23 DIAGNOSIS — I10 Essential (primary) hypertension: Secondary | ICD-10-CM | POA: Diagnosis not present

## 2022-07-23 DIAGNOSIS — E782 Mixed hyperlipidemia: Secondary | ICD-10-CM | POA: Diagnosis not present

## 2022-07-23 DIAGNOSIS — E559 Vitamin D deficiency, unspecified: Secondary | ICD-10-CM | POA: Diagnosis not present

## 2022-07-28 DIAGNOSIS — I10 Essential (primary) hypertension: Secondary | ICD-10-CM | POA: Diagnosis not present

## 2022-07-28 DIAGNOSIS — H6122 Impacted cerumen, left ear: Secondary | ICD-10-CM | POA: Diagnosis not present

## 2022-07-28 DIAGNOSIS — E1165 Type 2 diabetes mellitus with hyperglycemia: Secondary | ICD-10-CM | POA: Diagnosis not present

## 2022-07-28 DIAGNOSIS — G250 Essential tremor: Secondary | ICD-10-CM | POA: Diagnosis not present

## 2022-07-28 DIAGNOSIS — E785 Hyperlipidemia, unspecified: Secondary | ICD-10-CM | POA: Diagnosis not present

## 2022-07-28 DIAGNOSIS — E1140 Type 2 diabetes mellitus with diabetic neuropathy, unspecified: Secondary | ICD-10-CM | POA: Diagnosis not present

## 2022-07-28 DIAGNOSIS — Z6839 Body mass index (BMI) 39.0-39.9, adult: Secondary | ICD-10-CM | POA: Diagnosis not present

## 2022-07-31 ENCOUNTER — Ambulatory Visit: Payer: Medicare Other | Admitting: Urology

## 2022-08-04 ENCOUNTER — Ambulatory Visit (INDEPENDENT_AMBULATORY_CARE_PROVIDER_SITE_OTHER): Payer: Medicare Other | Admitting: Urology

## 2022-08-04 ENCOUNTER — Encounter: Payer: Self-pay | Admitting: Urology

## 2022-08-04 VITALS — BP 159/76 | HR 65

## 2022-08-04 DIAGNOSIS — Z87440 Personal history of urinary (tract) infections: Secondary | ICD-10-CM | POA: Diagnosis not present

## 2022-08-04 DIAGNOSIS — N390 Urinary tract infection, site not specified: Secondary | ICD-10-CM

## 2022-08-04 DIAGNOSIS — R32 Unspecified urinary incontinence: Secondary | ICD-10-CM | POA: Diagnosis not present

## 2022-08-04 MED ORDER — CIPROFLOXACIN HCL 250 MG PO TABS: 250.0000 mg | ORAL_TABLET | Freq: Two times a day (BID) | ORAL | 7 refills | Status: DC

## 2022-08-04 NOTE — Addendum Note (Signed)
Addended by: Audie Box on: 08/04/2022 04:41 PM   Modules accepted: Orders

## 2022-08-04 NOTE — H&P (View-Only) (Signed)
08/04/2022 9:57 AM   Marc Burns 01-15-1946 [id number]  Referring provider: Practice, [facility],  [place] [address]  Followup urge incontinence   HPI: Marc Burns is a 76yo here for followup for urinary incontinence and recurrent UTI. Starting 1 month ago the incontinence worsened. He had 100 units intravesical botox 7 months ago. He used his last dose of cipro 2 weeks ago. No dysuria currently   PMH: Past Medical History:  Diagnosis Date   Arthritis    Bladder neck contracture    Complication of anesthesia    Difficult intubation   Difficult intubation    GERD (gastroesophageal reflux disease)    History of gastric ulcer    2007   History of kidney stones    History of prostate cancer    2004--  S/P PROSTATECTOMY   Hypertension    Urinary incontinence    Wears glasses     Surgical History: Past Surgical History:  Procedure Laterality Date   BALLOON DILATION N/A 09/12/2013   Procedure: CYSTO BALLOON DILATION/RETROGRADE URETHROGRAM;  Surgeon: Reece Packer, MD;  Location: [facility];  Service: Urology;  Laterality: N/A;   BOTOX INJECTION N/A 01/08/2022   Procedure: BOTOX INJECTION;  Surgeon: Cleon Gustin, MD;  Location: [facility];  Service: Urology;  Laterality: N/A;   CYSTO/  URETHRAL DILATION/ BLADDER BX/ BILATERAL RETROGRADE PYELOGRAM  03-03-2010   CYSTO/ BALLOON DILATION AND VAPORIZATION BLADDER NECK & PROSTATIC FOSSA  04-28-2010   CYSTO/ BALLOON DILATION BLADDER NECK CONTRACTURE  07-23-2011;   11-17-2010;  08-08-2010   CYSTOSCOPY N/A 01/08/2022   Procedure: CYSTOSCOPY WITH INTRAVESICAL BOTOX 100 UNITS;  Surgeon: Cleon Gustin, MD;  Location: [facility];  Service: Urology;  Laterality: N/A;   CYSTOSCOPY WITH DIRECT VISION INTERNAL URETHROTOMY N/A 04/24/2021   Procedure: CYSTOSCOPY WITH DIRECT VISION INTERNAL URETHROTOMY;  Surgeon: Cleon Gustin, MD;  Location: [facility];  Service: Urology;  Laterality: N/A;    FULGURATION OF LESION  04/24/2021   Procedure: FULGURATION OF BLADDER NECK;  Surgeon: Cleon Gustin, MD;  Location: [facility];  Service: Urology;;   MITOMYCIN C INJECTION  04/24/2021   Procedure: MITOMYCIN C INJECTION ANTERIOR BLADDER WALL;  Surgeon: Cleon Gustin, MD;  Location: [facility];  Service: Urology;;   PROSTATECTOMY  2004   RADICAL   TONSILLECTOMY  AS CHILD   TRANSURETHRAL INCISION AND RESECTION OF BLADDER NECK CONTRACTURE  [id number]   UMBILICAL HERNIA REPAIR      Home Medications:  Allergies as of 08/04/2022       Reactions   Sulfa Antibiotics Itching, Rash        Medication List        Accurate as of August 04, 2022  9:57 AM. If you have any questions, ask your nurse or doctor.          amLODipine-benazepril 5-20 MG capsule Commonly known as: LOTREL Take 1 capsule by mouth every morning.   aspirin EC 81 MG tablet Take 81 mg by mouth daily. Swallow whole.   CENTRUM SILVER ADULT 50+ PO Take 1 tablet by mouth daily.   clotrimazole-betamethasone cream Commonly known as: Lotrisone Apply 1 application. topically 2 (two) times daily.   clotrimazole-betamethasone cream Commonly known as: Lotrisone Apply 1 application. topically 2 (two) times daily as needed (itching).   empagliflozin 25 MG Tabs tablet Commonly known as: JARDIANCE Take 25 mg by mouth daily.   furosemide 20 MG tablet Commonly known as: LASIX Take  20 mg by mouth daily.   gabapentin 300 MG capsule Commonly known as: NEURONTIN Take 600 mg by mouth 2 (two) times daily.   ibuprofen 200 MG tablet Commonly known as: ADVIL Take 400 mg by mouth every 6 (six) hours as needed for moderate pain or headache.   metoprolol tartrate 25 MG tablet Commonly known as: LOPRESSOR Take 25 mg by mouth 2 (two) times daily.   mupirocin ointment 2 % Commonly known as: BACTROBAN Apply 1 application topically 2 (two) times daily as needed for wound care.   omeprazole 20 MG capsule Commonly known as:  PRILOSEC Take 20 mg by mouth 2 (two) times daily.   oxybutynin 10 MG 24 hr tablet Commonly known as: DITROPAN-XL Take by mouth.   Ozempic (0.25 or 0.5 MG/DOSE) 2 MG/1.5ML Sopn Generic drug: Semaglutide(0.25 or 0.'5MG'$ /DOS) Inject 0.5 mg into the skin every Sunday.   primidone 50 MG tablet Commonly known as: MYSOLINE Take 1 tablet (50 mg total) by mouth 2 (two) times daily.   rosuvastatin 10 MG tablet Commonly known as: CRESTOR Take 10 mg by mouth at bedtime.        Allergies:  Allergies  Allergen Reactions   Sulfa Antibiotics Itching and Rash    Family History: Family History  Problem Relation Age of Onset   Heart attack Mother    Heart attack Father     Social History:  reports that he has never smoked. He has never used smokeless tobacco. He reports current alcohol use of about 6.0 standard drinks of alcohol per week. He reports that he does not use drugs.  ROS: All other review of systems were reviewed and are negative except what is noted above in HPI  Physical Exam: BP (!) 159/76   Pulse 65   Constitutional:  Alert and oriented, No acute distress. HEENT: [place] AT, moist mucus membranes.  Trachea midline, no masses. Cardiovascular: No clubbing, cyanosis, or edema. Respiratory: Normal respiratory effort, no increased work of breathing. GI: Abdomen is soft, nontender, nondistended, no abdominal masses GU: No CVA tenderness.  Lymph: No cervical or inguinal lymphadenopathy. Skin: No rashes, bruises or suspicious lesions. Neurologic: Grossly intact, no focal deficits, moving all 4 extremities. Psychiatric: Normal mood and affect.  Laboratory Data: Lab Results  Component Value Date   WBC 9.4 11/17/2010   HGB 15.0 09/12/2013   HCT 44.0 09/12/2013   MCV 87.8 11/17/2010   PLT 138 (L) 11/17/2010    Lab Results  Component Value Date   CREATININE 0.81 01/06/2022    No results found for: "PSA"  No results found for: "TESTOSTERONE"  Lab Results  Component  Value Date   HGBA1C 7.2 (H) 04/22/2021    Urinalysis    Component Value Date/Time   COLORURINE YELLOW 02/26/2010 0930   APPEARANCEUR CLOUDY (A) 02/26/2010 0930   LABSPEC 1.019 02/26/2010 0930   PHURINE 5.5 02/26/2010 0930   GLUCOSEU NEGATIVE 02/26/2010 0930   HGBUR LARGE (A) 02/26/2010 0930   BILIRUBINUR NEGATIVE 02/26/2010 0930   KETONESUR NEGATIVE 02/26/2010 0930   PROTEINUR 30 (A) 02/26/2010 0930   UROBILINOGEN 0.2 02/26/2010 0930   NITRITE NEGATIVE 02/26/2010 0930   LEUKOCYTESUR SMALL (A) 02/26/2010 0930    Lab Results  Component Value Date   BACTERIA FEW (A) 02/26/2010    Pertinent Imaging:  No results found for this or any previous visit.  No results found for this or any previous visit.  No results found for this or any previous visit.  No results found for  this or any previous visit.  Results for orders placed during the hospital encounter of 07/20/17  US Renal  Narrative CLINICAL DATA:  76 year old male with urinary retention. Self catheterization. Initial encounter.  EXAM: RENAL / URINARY TRACT ULTRASOUND COMPLETE  COMPARISON:  None.  FINDINGS: Right Kidney:  Length: 11.3 cm. Echogenicity within normal limits. No mass or hydronephrosis visualized.  Left Kidney:  Length: 11.8 cm. Echogenicity within normal limits. No mass or hydronephrosis visualized.  Bladder:  Decompressed and not adequately assessed  IMPRESSION: Kidneys unremarkable  Bladder decompressed and not assessed   Electronically Signed By: Genia Del M.D. On: 07/20/2017 12:25  No results found for this or any previous visit.  No results found for this or any previous visit.  No results found for this or any previous visit.   Assessment & Plan:    1. Urinary incontinence, unspecified type Schedule for intravesical botox 200 units - Urinalysis, Routine w reflex microscopic  2. Recurrent UTI -Self start cipro '250mg'$ . Rx for antibiotic given    No follow-ups  on file.  Nicolette Bang, MD  [facility] [place]

## 2022-08-04 NOTE — Patient Instructions (Signed)

## 2022-08-04 NOTE — Progress Notes (Signed)
08/04/2022 9:57 AM   Marc Burns [birth year] [id number]  Referring provider: Practice, [facility] [place],  [place] [address]  Followup urge incontinence   HPI: Marc Burns is a 76yo here for followup for urinary incontinence and recurrent UTI. Starting 1 month ago the incontinence worsened. He had 100 units intravesical botox 7 months ago. He used his last dose of cipro 2 weeks ago. No dysuria currently   PMH: Past Medical History:  Diagnosis Date   Arthritis    Bladder neck contracture    Complication of anesthesia    Difficult intubation   Difficult intubation    GERD (gastroesophageal reflux disease)    History of gastric ulcer    2007   History of kidney stones    History of prostate cancer    2004--  S/P PROSTATECTOMY   Hypertension    Urinary incontinence    Wears glasses     Surgical History: Past Surgical History:  Procedure Laterality Date   BALLOON DILATION N/A 09/12/2013   Procedure: CYSTO BALLOON DILATION/RETROGRADE URETHROGRAM;  Surgeon: Reece Packer, MD;  Location: [facility];  Service: Urology;  Laterality: N/A;   BOTOX INJECTION N/A 01/08/2022   Procedure: BOTOX INJECTION;  Surgeon: Cleon Gustin, MD;  Location: [facility];  Service: Urology;  Laterality: N/A;   CYSTO/  URETHRAL DILATION/ BLADDER BX/ BILATERAL RETROGRADE PYELOGRAM  03-03-2010   CYSTO/ BALLOON DILATION AND VAPORIZATION BLADDER NECK & PROSTATIC FOSSA  04-28-2010   CYSTO/ BALLOON DILATION BLADDER NECK CONTRACTURE  07-23-2011;   11-17-2010;  08-08-2010   CYSTOSCOPY N/A 01/08/2022   Procedure: CYSTOSCOPY WITH INTRAVESICAL BOTOX 100 UNITS;  Surgeon: Cleon Gustin, MD;  Location: [facility];  Service: Urology;  Laterality: N/A;   CYSTOSCOPY WITH DIRECT VISION INTERNAL URETHROTOMY N/A 04/24/2021   Procedure: CYSTOSCOPY WITH DIRECT VISION INTERNAL URETHROTOMY;  Surgeon: Cleon Gustin, MD;  Location: [facility];  Service: Urology;  Laterality: N/A;    FULGURATION OF LESION  04/24/2021   Procedure: FULGURATION OF BLADDER NECK;  Surgeon: Cleon Gustin, MD;  Location: [facility];  Service: Urology;;   MITOMYCIN C INJECTION  04/24/2021   Procedure: MITOMYCIN C INJECTION ANTERIOR BLADDER WALL;  Surgeon: Cleon Gustin, MD;  Location: [facility];  Service: Urology;;   PROSTATECTOMY  2004   RADICAL   TONSILLECTOMY  AS CHILD   TRANSURETHRAL INCISION AND RESECTION OF BLADDER NECK CONTRACTURE  [id number]   UMBILICAL HERNIA REPAIR      Home Medications:  Allergies as of 08/04/2022       Reactions   Sulfa Antibiotics Itching, Rash        Medication List        Accurate as of August 04, 2022  9:57 AM. If you have any questions, ask your nurse or doctor.          amLODipine-benazepril 5-20 MG capsule Commonly known as: LOTREL Take 1 capsule by mouth every morning.   aspirin EC 81 MG tablet Take 81 mg by mouth daily. Swallow whole.   CENTRUM SILVER ADULT 50+ PO Take 1 tablet by mouth daily.   clotrimazole-betamethasone cream Commonly known as: Lotrisone Apply 1 application. topically 2 (two) times daily.   clotrimazole-betamethasone cream Commonly known as: Lotrisone Apply 1 application. topically 2 (two) times daily as needed (itching).   empagliflozin 25 MG Tabs tablet Commonly known as: JARDIANCE Take 25 mg by mouth daily.   furosemide 20 MG tablet Commonly known as: LASIX Take  20 mg by mouth daily.   gabapentin 300 MG capsule Commonly known as: NEURONTIN Take 600 mg by mouth 2 (two) times daily.   ibuprofen 200 MG tablet Commonly known as: ADVIL Take 400 mg by mouth every 6 (six) hours as needed for moderate pain or headache.   metoprolol tartrate 25 MG tablet Commonly known as: LOPRESSOR Take 25 mg by mouth 2 (two) times daily.   mupirocin ointment 2 % Commonly known as: BACTROBAN Apply 1 application topically 2 (two) times daily as needed for wound care.   omeprazole 20 MG capsule Commonly known as:  PRILOSEC Take 20 mg by mouth 2 (two) times daily.   oxybutynin 10 MG 24 hr tablet Commonly known as: DITROPAN-XL Take by mouth.   Ozempic (0.25 or 0.5 MG/DOSE) 2 MG/1.5ML Sopn Generic drug: Semaglutide(0.25 or 0.'5MG'$ /DOS) Inject 0.5 mg into the skin every Sunday.   primidone 50 MG tablet Commonly known as: MYSOLINE Take 1 tablet (50 mg total) by mouth 2 (two) times daily.   rosuvastatin 10 MG tablet Commonly known as: CRESTOR Take 10 mg by mouth at bedtime.        Allergies:  Allergies  Allergen Reactions   Sulfa Antibiotics Itching and Rash    Family History: Family History  Problem Relation Age of Onset   Heart attack Mother    Heart attack Father     Social History:  reports that he has never smoked. He has never used smokeless tobacco. He reports current alcohol use of about 6.0 standard drinks of alcohol per week. He reports that he does not use drugs.  ROS: All other review of systems were reviewed and are negative except what is noted above in HPI  Physical Exam: BP (!) 159/76   Pulse 65   Constitutional:  Alert and oriented, No acute distress. HEENT: [place] AT, moist mucus membranes.  Trachea midline, no masses. Cardiovascular: No clubbing, cyanosis, or edema. Respiratory: Normal respiratory effort, no increased work of breathing. GI: Abdomen is soft, nontender, nondistended, no abdominal masses GU: No CVA tenderness.  Lymph: No cervical or inguinal lymphadenopathy. Skin: No rashes, bruises or suspicious lesions. Neurologic: Grossly intact, no focal deficits, moving all 4 extremities. Psychiatric: Normal mood and affect.  Laboratory Data: Lab Results  Component Value Date   WBC 9.4 11/17/2010   HGB 15.0 09/12/2013   HCT 44.0 09/12/2013   MCV 87.8 11/17/2010   PLT 138 (L) 11/17/2010    Lab Results  Component Value Date   CREATININE 0.81 01/06/2022    No results found for: "PSA"  No results found for: "TESTOSTERONE"  Lab Results  Component  Value Date   HGBA1C 7.2 (H) 04/22/2021    Urinalysis    Component Value Date/Time   COLORURINE YELLOW 02/26/2010 0930   APPEARANCEUR CLOUDY (A) 02/26/2010 0930   LABSPEC 1.019 02/26/2010 0930   PHURINE 5.5 02/26/2010 0930   GLUCOSEU NEGATIVE 02/26/2010 0930   HGBUR LARGE (A) 02/26/2010 0930   BILIRUBINUR NEGATIVE 02/26/2010 0930   KETONESUR NEGATIVE 02/26/2010 0930   PROTEINUR 30 (A) 02/26/2010 0930   UROBILINOGEN 0.2 02/26/2010 0930   NITRITE NEGATIVE 02/26/2010 0930   LEUKOCYTESUR SMALL (A) 02/26/2010 0930    Lab Results  Component Value Date   BACTERIA FEW (A) 02/26/2010    Pertinent Imaging:  No results found for this or any previous visit.  No results found for this or any previous visit.  No results found for this or any previous visit.  No results found for  this or any previous visit.  Results for orders placed during the hospital encounter of 07/20/17  US Renal  Narrative CLINICAL DATA:  76 year old male with urinary retention. Self catheterization. Initial encounter.  EXAM: RENAL / URINARY TRACT ULTRASOUND COMPLETE  COMPARISON:  None.  FINDINGS: Right Kidney:  Length: 11.3 cm. Echogenicity within normal limits. No mass or hydronephrosis visualized.  Left Kidney:  Length: 11.8 cm. Echogenicity within normal limits. No mass or hydronephrosis visualized.  Bladder:  Decompressed and not adequately assessed  IMPRESSION: Kidneys unremarkable  Bladder decompressed and not assessed   Electronically Signed By: Genia Del M.D. On: 07/20/2017 12:25  No results found for this or any previous visit.  No results found for this or any previous visit.  No results found for this or any previous visit.   Assessment & Plan:    1. Urinary incontinence, unspecified type Schedule for intravesical botox 200 units - Urinalysis, Routine w reflex microscopic  2. Recurrent UTI -Self start cipro '250mg'$ . Rx for antibiotic given    No follow-ups  on file.  Nicolette Bang, MD  [facility] [place]

## 2022-08-07 NOTE — Patient Instructions (Signed)
Marc Burns  08/07/2022     '@PREFPERIOPPHARMACY'$ @   Your procedure is scheduled on  08/13/2022.   Report to [name] A.M.   Call this number if you have problems the morning of surgery:  [phone]   Remember:  Do not eat or drink after midnight.      Take these medicines the morning of surgery with A SIP OF WATER         neurontin, gemtesa, metoprolol, prilosec, ditropan.     Do not wear jewelry, make-up or nail polish.  Do not wear lotions, powders, or perfumes, or deodorant.  Do not shave 48 hours prior to surgery.  Men may shave face and neck.  Do not bring valuables to the hospital.  [name] is not responsible for any belongings or valuables.  Contacts, dentures or bridgework may not be worn into surgery.  Leave your suitcase in the car.  After surgery it may be brought to your room.  For patients admitted to the hospital, discharge time will be determined by your treatment team.  Patients discharged the day of surgery will not be allowed to drive home and must have someone with them for 24 hours.    Special instructions:   DO NOT smoke tobacco or vape for 24 hours before your procedure.  Please read over the following fact sheets that you were given. Coughing and Deep Breathing, Surgical Site Infection Prevention, Anesthesia Post-op Instructions, and Care and Recovery After Surgery      Botulinum Toxin Bladder Injection  A botulinum toxin bladder injection is a procedure to treat an overactive bladder. During the procedure, a drug called botulinum toxin is injected into the bladder through a long, thin needle. This drug relaxes the bladder muscles and reduces overactivity. You may need this procedure if your medicines are not working or you cannot take them. The procedure may be repeated as needed. The treatment is done once and it usually lasts for 6 months. Your health care provider will monitor you to see how well you respond. Tell a  health care provider about: Any allergies you have. All medicines you are taking, including vitamins, herbs, eye drops, creams, and over-the-counter medicines. Any problems you or family members have had with anesthetic medicines. Any bleeding problems you have. Any surgeries you have had. Any medical conditions you have. Any previous reactions to a botulinum toxin injection. Any symptoms of urinary tract infection. These include chills, fever, a burning feeling when passing urine, and needing to pass urine often. Whether you are pregnant or may be pregnant. What are the risks? Generally this is a safe procedure. However, problems may occur, including: Not being able to pass urine. If this happens, you may need to have your bladder emptied with a thin tube (urinary catheter). Bleeding. Urinary tract infection. Allergic reaction to the botulinum toxin. Pain or burning when passing urine. Damage to nearby structures or organs. What happens before the procedure? When to stop eating and drinking Follow instructions from your health care provider about what you may eat and drink before your procedure. These may include: 8 hours before the procedure Stop eating most foods. Do not eat meat, fried foods, or fatty foods. Eat only light foods, such as toast or crackers. All liquids are okay except energy drinks and alcohol. 6 hours before the procedure Stop eating. Drink only clear liquids, such as water, clear fruit juice, black coffee, plain tea, and sports  drinks. Do not drink energy drinks or alcohol. 2 hours before the procedure Stop drinking all liquids. You may be allowed to take medicines with small sips of water. If you do not follow your health care provider's instructions, your procedure may be delayed or canceled. Medicines Ask your health care provider about: Changing or stopping your regular medicines. This is especially important if you are taking diabetes medicines or blood  thinners. Taking medicines such as aspirin and ibuprofen. These medicines can thin your blood. Do not take these medicines unless your health care provider tells you to take them. Taking over-the-counter medicines, vitamins, herbs, and supplements. General instructions Ask your health care provider what steps will be taken to help prevent infection. These steps may include: Removing hair at the procedure site. Washing skin with a germ-killing soap. Taking antibiotic medicine. If you will be going home right after the procedure, plan to have a responsible adult: Take you home from the hospital or clinic. You will not be allowed to drive. Care for you for the time you are told. What happens during the procedure?  You will be asked to empty your bladder. An IV will be inserted into one of your veins. You will be given one or more of the following: A medicine to help you relax (sedative). A medicine to numb the area (local anesthetic). A medicine to make you fall asleep (general anesthetic). A long, thin scope called a cystoscope will be passed into your bladder through the part of the body that carries urine from your bladder (urethra). The cystoscope will be used to fill your bladder with water. A long needle will be passed through the cystoscope and into the bladder. The botulinum toxin will be injected into your bladder. It may be injected into multiple areas of your bladder. The cystoscope will be removed and your bladder will be emptied with a urinary catheter. The procedure may vary among health care providers and hospitals. What can I expect after the procedure? After your procedure, it is common to have: Blood-tinged urine. Burning or soreness when you pass urine. Follow these instructions at home: Medicines Take over-the-counter and prescription medicines only as told by your health care provider. If you were prescribed an antibiotic medicine, take it as told by your health care  provider. Do not stop using the antibiotic even if you start to feel better. General instructions  If you were given a sedative during the procedure, it can affect you for several hours. Do not drive or operate machinery until your health care provider says that it is safe. Drink enough fluid to keep your urine pale yellow. Return to your normal activities as told by your health care provider. Ask your health care provider what activities are safe for you. Keep all follow-up visits. Contact a health care provider if you have: A fever or chills. Blood-tinged urine for more than one day after your procedure. Worsening pain or burning when you pass urine. Pain or burning when passing urine for more than two days after your procedure. Trouble emptying your bladder. Get help right away if you: Have bright red blood in your urine. Are unable to pass urine. Summary A botulinum toxin bladder injection is a procedure to treat an overactive bladder. This is generally a safe procedure. However, problems may occur, including not being able to pass urine, bleeding, infection, pain, and an allergic reaction to the botulinum toxin. You will be told when to stop eating and drinking, and what medicines  to change or stop. Follow instructions carefully. After the procedure, it is common to have blood in your urine and to have soreness or burning when passing urine. Contact a health care provider if you have a fever, blood in your urine for more than a few days, or trouble passing urine. Get help right away if you have bright red blood in your urine, or if you are unable to pass urine. This information is not intended to replace advice given to you by your health care provider. Make sure you discuss any questions you have with your health care provider. Document Revised: 06/13/2021 Document Reviewed: 06/13/2021 Elsevier Patient Education  [place] Anesthesia, Adult, Care After The following  information offers guidance on how to care for yourself after your procedure. Your health care provider may also give you more specific instructions. If you have problems or questions, contact your health care provider. What can I expect after the procedure? After the procedure, it is common for people to: Have pain or discomfort at the IV site. Have nausea or vomiting. Have a sore throat or hoarseness. Have trouble concentrating. Feel cold or chills. Feel weak, sleepy, or tired (fatigue). Have soreness and body aches. These can affect parts of the body that were not involved in surgery. Follow these instructions at home: For the time period you were told by your health care provider:  Rest. Do not participate in activities where you could fall or become injured. Do not drive or use machinery. Do not drink alcohol. Do not take sleeping pills or medicines that cause drowsiness. Do not make important decisions or sign legal documents. Do not take care of children on your own. General instructions Drink enough fluid to keep your urine pale yellow. If you have sleep apnea, surgery and certain medicines can increase your risk for breathing problems. Follow instructions from your health care provider about wearing your sleep device: Anytime you are sleeping, including during daytime naps. While taking prescription pain medicines, sleeping medicines, or medicines that make you drowsy. Return to your normal activities as told by your health care provider. Ask your health care provider what activities are safe for you. Take over-the-counter and prescription medicines only as told by your health care provider. Do not use any products that contain nicotine or tobacco. These products include cigarettes, chewing tobacco, and vaping devices, such as e-cigarettes. These can delay incision healing after surgery. If you need help quitting, ask your health care provider. Contact a health care provider  if: You have nausea or vomiting that does not get better with medicine. You vomit every time you eat or drink. You have pain that does not get better with medicine. You cannot urinate or have bloody urine. You develop a skin rash. You have a fever. Get help right away if: You have trouble breathing. You have chest pain. You vomit blood. These symptoms may be an emergency. Get help right away. Call 911. Do not wait to see if the symptoms will go away. Do not drive yourself to the hospital. Summary After the procedure, it is common to have a sore throat, hoarseness, nausea, vomiting, or to feel weak, sleepy, or fatigue. For the time period you were told by your health care provider, do not drive or use machinery. Get help right away if you have difficulty breathing, have chest pain, or vomit blood. These symptoms may be an emergency. This information is not intended to replace advice given to you by your health  care provider. Make sure you discuss any questions you have with your health care provider. Document Revised: 03/06/2022 Document Reviewed: 03/06/2022 Elsevier Patient Education  [place].

## 2022-08-11 ENCOUNTER — Encounter (HOSPITAL_COMMUNITY)
Admission: RE | Admit: 2022-08-11 | Discharge: 2022-08-11 | Disposition: A | Discharge status: Home / Self Care | Payer: Medicare Other | Source: Ambulatory Visit | Attending: Urology | Admitting: Urology

## 2022-08-11 ENCOUNTER — Other Ambulatory Visit: Payer: Self-pay

## 2022-08-11 ENCOUNTER — Encounter (HOSPITAL_COMMUNITY): Payer: Self-pay

## 2022-08-11 VITALS — BP 122/60 | HR 56 | Temp 98.3°F | Resp 18 | Ht 68.0 in | Wt 250.0 lb

## 2022-08-11 DIAGNOSIS — Z01812 Encounter for preprocedural laboratory examination: Secondary | ICD-10-CM | POA: Diagnosis not present

## 2022-08-11 DIAGNOSIS — E119 Type 2 diabetes mellitus without complications: Secondary | ICD-10-CM | POA: Diagnosis not present

## 2022-08-11 HISTORY — DX: Presence of other specified devices: Z97.8

## 2022-08-11 HISTORY — DX: Type 2 diabetes mellitus without complications: E11.9

## 2022-08-11 LAB — BASIC METABOLIC PANEL
Anion gap: 8 (ref 5–15)
BUN: 18 mg/dL (ref 8–23)
CO2: 24 mmol/L (ref 22–32)
Calcium: 9.2 mg/dL (ref 8.9–10.3)
Chloride: 103 mmol/L (ref 98–111)
Creatinine, Ser: 0.95 mg/dL (ref 0.61–1.24)
GFR, Estimated: >60 mL/min (ref >60)
Glucose, Bld: 125 mg/dL — ABNORMAL HIGH (ref 70–99)
Potassium: 4.2 mmol/L (ref 3.5–5.1)
Sodium: 135 mmol/L (ref 135–145)

## 2022-08-11 LAB — HEMOGLOBIN A1C
Hgb A1c MFr Bld: 7.1 % — ABNORMAL HIGH (ref 4.8–5.6)
Mean Plasma Glucose: 157.07 mg/dL

## 2022-08-11 NOTE — Pre-Procedure Instructions (Signed)
Dr Adalberto Ill is aware that Ozempic was taken on 8/19. He is okay to proceed.

## 2022-08-12 MED ORDER — ONABOTULINUMTOXINA 200 UNITS IJ SOLR
200.0000 [IU] | Freq: Once | INTRAMUSCULAR | Status: DC
  Filled 2022-08-12: qty 200

## 2022-08-13 ENCOUNTER — Ambulatory Visit (HOSPITAL_COMMUNITY)
Admission: RE | Admit: 2022-08-13 | Discharge: 2022-08-13 | Disposition: A | Discharge status: Home / Self Care | Payer: Medicare Other | Attending: Urology | Admitting: Urology

## 2022-08-13 ENCOUNTER — Ambulatory Visit (HOSPITAL_BASED_OUTPATIENT_CLINIC_OR_DEPARTMENT_OTHER): Payer: Medicare Other | Admitting: Anesthesiology

## 2022-08-13 ENCOUNTER — Other Ambulatory Visit: Payer: Self-pay

## 2022-08-13 ENCOUNTER — Encounter (HOSPITAL_COMMUNITY): Payer: Self-pay | Admitting: Urology

## 2022-08-13 ENCOUNTER — Ambulatory Visit (HOSPITAL_COMMUNITY): Payer: Medicare Other | Admitting: Anesthesiology

## 2022-08-13 ENCOUNTER — Encounter (HOSPITAL_COMMUNITY)
Admission: RE | Disposition: A | Discharge status: Home / Self Care | Payer: Self-pay | Source: Home / Self Care | Attending: Urology

## 2022-08-13 DIAGNOSIS — N3281 Overactive bladder: Secondary | ICD-10-CM

## 2022-08-13 DIAGNOSIS — Z87440 Personal history of urinary (tract) infections: Secondary | ICD-10-CM | POA: Diagnosis not present

## 2022-08-13 DIAGNOSIS — E119 Type 2 diabetes mellitus without complications: Secondary | ICD-10-CM | POA: Diagnosis not present

## 2022-08-13 DIAGNOSIS — K219 Gastro-esophageal reflux disease without esophagitis: Secondary | ICD-10-CM | POA: Diagnosis not present

## 2022-08-13 DIAGNOSIS — I10 Essential (primary) hypertension: Secondary | ICD-10-CM | POA: Diagnosis not present

## 2022-08-13 DIAGNOSIS — N319 Neuromuscular dysfunction of bladder, unspecified: Secondary | ICD-10-CM | POA: Diagnosis not present

## 2022-08-13 DIAGNOSIS — R32 Unspecified urinary incontinence: Secondary | ICD-10-CM | POA: Insufficient documentation

## 2022-08-13 HISTORY — PX: CYSTOSCOPY WITH INJECTION: SHX1424

## 2022-08-13 HISTORY — PX: BOTOX INJECTION: SHX5754

## 2022-08-13 LAB — GLUCOSE, CAPILLARY: Glucose-Capillary: 135 mg/dL — ABNORMAL HIGH (ref 70–99)

## 2022-08-13 SURGERY — CYSTOSCOPY, WITH INJECTION OF BLADDER NECK OR BLADDER WALL
Anesthesia: General | Site: Bladder

## 2022-08-13 MED ORDER — CEFAZOLIN SODIUM-DEXTROSE 2-4 GM/100ML-% IV SOLN
INTRAVENOUS | Status: AC
  Filled 2022-08-13: qty 100

## 2022-08-13 MED ORDER — EPHEDRINE 5 MG/ML INJ
INTRAVENOUS | Status: AC
  Filled 2022-08-13: qty 5

## 2022-08-13 MED ORDER — ONDANSETRON HCL 4 MG/2ML IJ SOLN
INTRAMUSCULAR | Status: AC
  Filled 2022-08-13: qty 2

## 2022-08-13 MED ORDER — FENTANYL CITRATE (PF) 100 MCG/2ML IJ SOLN
INTRAMUSCULAR | Status: AC
  Filled 2022-08-13: qty 2

## 2022-08-13 MED ORDER — LACTATED RINGERS IV SOLN: INTRAVENOUS | Status: DC

## 2022-08-13 MED ORDER — CHLORHEXIDINE GLUCONATE 0.12 % MT SOLN: 15.0000 mL | Freq: Once | OROMUCOSAL | Status: DC

## 2022-08-13 MED ORDER — SODIUM CHLORIDE (PF) 0.9 % IJ SOLN
INTRAMUSCULAR | Status: AC
  Filled 2022-08-13: qty 20

## 2022-08-13 MED ORDER — FENTANYL CITRATE PF 50 MCG/ML IJ SOSY: 25.0000 ug | PREFILLED_SYRINGE | INTRAMUSCULAR | Status: DC | PRN

## 2022-08-13 MED ORDER — DEXAMETHASONE SODIUM PHOSPHATE 10 MG/ML IJ SOLN
INTRAMUSCULAR | Status: AC
  Filled 2022-08-13: qty 1

## 2022-08-13 MED ORDER — WATER FOR IRRIGATION, STERILE IR SOLN
Status: DC | PRN
  Administered 2022-08-13: 500 mL
  Administered 2022-08-13: 3000 mL

## 2022-08-13 MED ORDER — ONDANSETRON HCL 4 MG/2ML IJ SOLN
INTRAMUSCULAR | Status: DC | PRN
  Administered 2022-08-13: 4 mg via INTRAVENOUS

## 2022-08-13 MED ORDER — PROPOFOL 10 MG/ML IV BOLUS
INTRAVENOUS | Status: DC | PRN
  Administered 2022-08-13: 120 mg via INTRAVENOUS

## 2022-08-13 MED ORDER — FENTANYL CITRATE (PF) 100 MCG/2ML IJ SOLN
INTRAMUSCULAR | Status: DC | PRN
Start: 2022-08-13 — End: 2022-08-13
  Administered 2022-08-13 (×2): 50 ug via INTRAVENOUS

## 2022-08-13 MED ORDER — DEXAMETHASONE SODIUM PHOSPHATE 10 MG/ML IJ SOLN
INTRAMUSCULAR | Status: DC | PRN
  Administered 2022-08-13: 8 mg via INTRAVENOUS

## 2022-08-13 MED ORDER — ONDANSETRON HCL 4 MG/2ML IJ SOLN: 4.0000 mg | Freq: Once | INTRAMUSCULAR | Status: DC | PRN

## 2022-08-13 MED ORDER — ONABOTULINUMTOXINA 100 UNITS IJ SOLR
INTRAMUSCULAR | Status: DC | PRN
  Administered 2022-08-13: 200 [IU] via INTRAMUSCULAR

## 2022-08-13 MED ORDER — ORAL CARE MOUTH RINSE: 15.0000 mL | Freq: Once | OROMUCOSAL | Status: DC

## 2022-08-13 MED ORDER — CEFAZOLIN SODIUM-DEXTROSE 2-4 GM/100ML-% IV SOLN
2.0000 g | INTRAVENOUS | Status: AC
  Administered 2022-08-13: 2 g via INTRAVENOUS

## 2022-08-13 MED ORDER — MIDAZOLAM HCL 2 MG/2ML IJ SOLN
INTRAMUSCULAR | Status: AC
  Filled 2022-08-13: qty 2

## 2022-08-13 MED ORDER — MIDAZOLAM HCL 5 MG/5ML IJ SOLN
INTRAMUSCULAR | Status: DC | PRN
  Administered 2022-08-13 (×2): 1 mg via INTRAVENOUS

## 2022-08-13 MED ORDER — SODIUM CHLORIDE (PF) 0.9 % IJ SOLN
INTRAMUSCULAR | Status: DC | PRN
  Administered 2022-08-13: 20 mL via INTRAVENOUS

## 2022-08-13 MED ORDER — EPHEDRINE SULFATE (PRESSORS) 50 MG/ML IJ SOLN
INTRAMUSCULAR | Status: DC | PRN
  Administered 2022-08-13 (×2): 10 mg via INTRAVENOUS

## 2022-08-13 MED ORDER — PROPOFOL 10 MG/ML IV BOLUS
INTRAVENOUS | Status: AC
  Filled 2022-08-13: qty 20

## 2022-08-13 SURGICAL SUPPLY — 25 items
BAG DRAIN URO TABLE W/ADPT NS (BAG) ×1 IMPLANT
BAG DRN 8 ADPR NS SKTRN CSTL (BAG) ×1
BAG DRN RND TRDRP ANRFLXCHMBR (UROLOGICAL SUPPLIES) ×1
BAG HAMPER (MISCELLANEOUS) ×1 IMPLANT
BAG URINE DRAIN 2000ML AR STRL (UROLOGICAL SUPPLIES) IMPLANT
CATH FOLEY 2WAY SLVR  5CC 20FR (CATHETERS) ×1
CATH FOLEY 2WAY SLVR 5CC 20FR (CATHETERS) IMPLANT
CLOTH BEACON ORANGE TIMEOUT ST (SAFETY) ×1 IMPLANT
GLOVE BIO SURGEON STRL SZ8 (GLOVE) ×1 IMPLANT
GLOVE BIOGEL PI IND STRL 7.0 (GLOVE) ×2 IMPLANT
GLOVE BIOGEL PI INDICATOR 7.0 (GLOVE) ×2
GOWN STRL REUS W/TWL LRG LVL3 (GOWN DISPOSABLE) ×1 IMPLANT
GOWN STRL REUS W/TWL XL LVL3 (GOWN DISPOSABLE) ×1 IMPLANT
KIT TURNOVER CYSTO (KITS) ×1 IMPLANT
MANIFOLD NEPTUNE II (INSTRUMENTS) ×1 IMPLANT
NDL ASPIRATION 22 (NEEDLE) ×1 IMPLANT
NDL HYPO 18GX1.5 BLUNT FILL (NEEDLE) ×1 IMPLANT
NEEDLE ASPIRATION 22 (NEEDLE) ×1 IMPLANT
NEEDLE HYPO 18GX1.5 BLUNT FILL (NEEDLE) ×1 IMPLANT
PACK CYSTO (CUSTOM PROCEDURE TRAY) ×1 IMPLANT
PAD ARMBOARD 7.5X6 YLW CONV (MISCELLANEOUS) ×1 IMPLANT
SYR 30ML LL (SYRINGE) ×1 IMPLANT
SYR CONTROL 10ML LL (SYRINGE) ×1 IMPLANT
WATER STERILE IRR 3000ML UROMA (IV SOLUTION) ×1 IMPLANT
WATER STERILE IRR 500ML POUR (IV SOLUTION) ×1 IMPLANT

## 2022-08-13 NOTE — Anesthesia Preprocedure Evaluation (Signed)
Anesthesia Evaluation  Patient identified by MRN, date of birth, ID band Patient awake    Reviewed: Allergy & Precautions, NPO status , Patient's Chart, lab work & pertinent test results  History of Anesthesia Complications (+) DIFFICULT AIRWAY and history of anesthetic complications  Airway Mallampati: I  TM Distance: >3 FB Neck ROM: Full    Dental no notable dental hx. (+)  Many missing:   Pulmonary neg pulmonary ROS,    Pulmonary exam normal        Cardiovascular hypertension, negative cardio ROS Normal cardiovascular exam     Neuro/Psych negative neurological ROS     GI/Hepatic negative GI ROS, Neg liver ROS, GERD  ,  Endo/Other  negative endocrine ROSdiabetes  Renal/GU negative Renal ROS     Musculoskeletal negative musculoskeletal ROS (+) Arthritis ,   Abdominal   Peds  Hematology negative hematology ROS (+)   Anesthesia Other Findings   Reproductive/Obstetrics                             Anesthesia Physical  Anesthesia Plan  ASA: 2  Anesthesia Plan: General   Post-op Pain Management:    Induction: Intravenous  PONV Risk Score and Plan:   Airway Management Planned: LMA  Additional Equipment:   Intra-op Plan:   Post-operative Plan:   Informed Consent: I have reviewed the patients History and Physical, chart, labs and discussed the procedure including the risks, benefits and alternatives for the proposed anesthesia with the patient or authorized representative who has indicated his/her understanding and acceptance.     Dental advisory given  Plan Discussed with: CRNA  Anesthesia Plan Comments:         Anesthesia Quick Evaluation

## 2022-08-13 NOTE — Anesthesia Procedure Notes (Signed)
Procedure Name: LMA Insertion Date/Time: 08/13/2022 8:01 AM  Performed by: Jonna Munro, CRNAPre-anesthesia Checklist: Patient identified, Emergency Drugs available, Suction available, Patient being monitored and Timeout performed Patient Re-evaluated:Patient Re-evaluated prior to induction Oxygen Delivery Method: Circle system utilized Preoxygenation: Pre-oxygenation with 100% oxygen Induction Type: IV induction LMA: LMA inserted LMA Size: 4.0 Number of attempts: 1 Placement Confirmation: positive ETCO2, CO2 detector and breath sounds checked- equal and bilateral Tube secured with: Tape Dental Injury: Teeth and Oropharynx as per pre-operative assessment

## 2022-08-13 NOTE — Interval H&P Note (Signed)
History and Physical Interval Note:  08/13/2022 7:32 AM  Marc Burns  has presented today for surgery, with the diagnosis of Over Active Bladder .  The various methods of treatment have been discussed with the patient and family. After consideration of risks, benefits and other options for treatment, the patient has consented to  Procedure(s): CYSTOSCOPY WITH INJECTION (N/A) BOTOX INJECTION (N/A) as a surgical intervention.  The patient's history has been reviewed, patient examined, no change in status, stable for surgery.  I have reviewed the patient's chart and labs.  Questions were answered to the patient's satisfaction.     Nicolette Bang

## 2022-08-13 NOTE — Op Note (Signed)
Preoperative diagnosis: neurogenic bladder, overactive bladder  Postoperative diagnosis: same  Procedure: 1 cystoscopy 2. Intravesical botox injection 200 units  Attending: Nicolette Bang  Anesthesia: General  Estimated blood loss: Minimal  Drains: 20 french foley  Specimens: none  Antibiotics: ancef  Findings:  Ureteral orifices in normal anatomic location.  No masses/lesions in the bladder  Indications: Patient is a 76 year old male with a history of neurogenic bladder and urinary leakage refractory to anticholinergic therapy.  After discussing treatment options, they decided proceed with intravesical botox injection.  Procedure in detail: The patient was brought to the operating room and a brief timeout was done to ensure correct patient, correct procedure, correct site.  General anesthesia was administered patient was placed in dorsal lithotomy position.  Their genitalia was then prepped and draped in usual sterile fashion.  A rigid 86 French cystoscope was passed in the urethra and the bladder.  Bladder was inspected and we noted no masses or lesions.  the ureteral orifices were in the normal orthotopic locations. Using a botox injection needle we proceed to inject 200 units in a grid pattern between the ureteral orifices starting at the trigone to the mid posterior wall. We injected a total of 20 sites with 10 units per injection. The bladder was then drained and this concluded the procedure which was well tolerated by patient.  Complications: None  Condition: Stable, extubated, transferred to PACU  Plan: Patient is to be discharge home. They will followup in 1 month

## 2022-08-13 NOTE — Transfer of Care (Signed)
Immediate Anesthesia Transfer of Care Note  Patient: Marc Burns  Procedure(s) Performed: CYSTOSCOPY WITH INJECTION (Bladder) BOTOX INJECTION (Bladder)  Patient Location: PACU  Anesthesia Type:General  Level of Consciousness: awake, alert , oriented and patient cooperative  Airway & Oxygen Therapy: Patient Spontanous Breathing  Post-op Assessment: Report given to RN, Post -op Vital signs reviewed and stable and Patient moving all extremities X 4  Post vital signs: Reviewed and stable  Last Vitals:  Vitals Value Taken Time  BP    Temp    Pulse 67 08/13/22 0847  Resp 16 08/13/22 0847  SpO2 94 % 08/13/22 0846  Vitals shown include unvalidated device data.  Last Pain:  Vitals:   08/13/22 0709  PainSc: 0-No pain         Complications: No notable events documented.

## 2022-08-14 NOTE — Anesthesia Postprocedure Evaluation (Signed)
Anesthesia Post Note  Patient: Marc Burns  Procedure(s) Performed: CYSTOSCOPY WITH INJECTION (Bladder) BOTOX INJECTION (Bladder)  Patient location during evaluation: Phase II Anesthesia Type: General Level of consciousness: awake Pain management: pain level controlled Vital Signs Assessment: post-procedure vital signs reviewed and stable Respiratory status: spontaneous breathing and respiratory function stable Cardiovascular status: blood pressure returned to baseline and stable Postop Assessment: no headache and no apparent nausea or vomiting Anesthetic complications: no Comments: Late entry   No notable events documented.   Last Vitals:  Vitals:   08/13/22 0902 08/13/22 0909  BP: 127/64 (!) 135/59  Pulse: 68 60  Resp: 15 15  Temp:  36.7 C  SpO2:  92%    Last Pain:  Vitals:   08/13/22 0909  TempSrc: Oral  PainSc: 0-No pain                 [name]

## 2022-08-20 ENCOUNTER — Encounter (HOSPITAL_COMMUNITY): Payer: Self-pay | Admitting: Urology

## 2022-08-28 ENCOUNTER — Encounter: Payer: Self-pay | Admitting: Urology

## 2022-08-28 ENCOUNTER — Ambulatory Visit (INDEPENDENT_AMBULATORY_CARE_PROVIDER_SITE_OTHER): Payer: Medicare Other | Admitting: Urology

## 2022-08-28 VITALS — BP 151/81 | HR 69

## 2022-08-28 DIAGNOSIS — N3941 Urge incontinence: Secondary | ICD-10-CM

## 2022-08-28 DIAGNOSIS — R32 Unspecified urinary incontinence: Secondary | ICD-10-CM

## 2022-08-28 DIAGNOSIS — N319 Neuromuscular dysfunction of bladder, unspecified: Secondary | ICD-10-CM

## 2022-08-28 NOTE — Progress Notes (Signed)
08/28/2022 11:02 AM   Marc Burns [birth year] [id number]  Referring provider: Practice, [place],  [place] [id number]  No chief complaint on file.   HPI: [name] is a 76yo here for followup for urge incontinence. He underwent intravesical botox 200 units 2 weeks ago. Urinary incontinence significantly improved. He has occasional urgency.    PMH: Past Medical History:  Diagnosis Date   Arthritis    Bladder neck contracture    Complication of anesthesia    Difficult intubation   Diabetes mellitus without complication ([place])    Difficult intubation    Foley catheter in place    GERD (gastroesophageal reflux disease)    History of gastric ulcer    2007   History of kidney stones    History of prostate cancer    2004--  S/P PROSTATECTOMY   Hypertension    Urinary incontinence    Wears glasses     Surgical History: Past Surgical History:  Procedure Laterality Date   BALLOON DILATION N/A 09/12/2013   Procedure: CYSTO BALLOON DILATION/RETROGRADE URETHROGRAM;  Surgeon: Reece Packer, MD;  Location: [facility];  Service: Urology;  Laterality: N/A;   BOTOX INJECTION N/A 01/08/2022   Procedure: BOTOX INJECTION;  Surgeon: Cleon Gustin, MD;  Location: [facility];  Service: Urology;  Laterality: N/A;   BOTOX INJECTION N/A 08/13/2022   Procedure: BOTOX INJECTION;  Surgeon: Cleon Gustin, MD;  Location: [facility];  Service: Urology;  Laterality: N/A;   CYSTO/  URETHRAL DILATION/ BLADDER BX/ BILATERAL RETROGRADE PYELOGRAM  03-03-2010   CYSTO/ BALLOON DILATION AND VAPORIZATION BLADDER NECK & PROSTATIC FOSSA  04-28-2010   CYSTO/ BALLOON DILATION BLADDER NECK CONTRACTURE  07-23-2011;   11-17-2010;  08-08-2010   CYSTOSCOPY N/A 01/08/2022   Procedure: CYSTOSCOPY WITH INTRAVESICAL BOTOX 100 UNITS;  Surgeon: Cleon Gustin, MD;  Location: [facility];  Service: Urology;  Laterality: N/A;   CYSTOSCOPY WITH DIRECT VISION INTERNAL URETHROTOMY  N/A 04/24/2021   Procedure: CYSTOSCOPY WITH DIRECT VISION INTERNAL URETHROTOMY;  Surgeon: Cleon Gustin, MD;  Location: [facility];  Service: Urology;  Laterality: N/A;   CYSTOSCOPY WITH INJECTION N/A 08/13/2022   Procedure: CYSTOSCOPY WITH INJECTION;  Surgeon: Cleon Gustin, MD;  Location: [facility];  Service: Urology;  Laterality: N/A;   FULGURATION OF LESION  04/24/2021   Procedure: FULGURATION OF BLADDER NECK;  Surgeon: Cleon Gustin, MD;  Location: [facility];  Service: Urology;;   MITOMYCIN C INJECTION  04/24/2021   Procedure: MITOMYCIN C INJECTION ANTERIOR BLADDER WALL;  Surgeon: Cleon Gustin, MD;  Location: [facility];  Service: Urology;;   PROSTATECTOMY  2004   RADICAL   TONSILLECTOMY  AS CHILD   TRANSURETHRAL INCISION AND RESECTION OF BLADDER NECK CONTRACTURE  [id number]   UMBILICAL HERNIA REPAIR      Home Medications:  Allergies as of 08/28/2022       Reactions   Sulfa Antibiotics Itching, Rash        Medication List        Accurate as of August 28, 2022 11:02 AM. If you have any questions, ask your nurse or doctor.          amLODipine-benazepril 5-20 MG capsule Commonly known as: LOTREL Take 1 capsule by mouth every morning.   aspirin EC 81 MG tablet Take 81 mg by mouth daily. Swallow whole.   CENTRUM SILVER ADULT 50+ PO Take 1 tablet by mouth daily.   ciprofloxacin 250 MG  tablet Commonly known as: Cipro Take 1 tablet (250 mg total) by mouth 2 (two) times daily. What changed:  when to take this additional instructions   clotrimazole-betamethasone cream Commonly known as: Lotrisone Apply 1 application. topically 2 (two) times daily as needed (itching).   empagliflozin 25 MG Tabs tablet Commonly known as: JARDIANCE Take 25 mg by mouth daily.   furosemide 20 MG tablet Commonly known as: LASIX Take 20 mg by mouth daily.   gabapentin 300 MG capsule Commonly known as: NEURONTIN Take 300-600 mg by mouth See admin instructions. 600 mg in the  morning, 300 mg midday, 600 mg at bedtime   Gemtesa 75 MG Tabs Generic drug: Vibegron Take 75 mg by mouth daily.   ibuprofen 200 MG tablet Commonly known as: ADVIL Take 400 mg by mouth every 6 (six) hours as needed for moderate pain or headache.   metoprolol tartrate 25 MG tablet Commonly known as: LOPRESSOR Take 25 mg by mouth 2 (two) times daily.   mupirocin ointment 2 % Commonly known as: BACTROBAN Apply 1 application topically 2 (two) times daily as needed for wound care.   omeprazole 20 MG capsule Commonly known as: PRILOSEC Take 20 mg by mouth daily.   oxybutynin 10 MG 24 hr tablet Commonly known as: DITROPAN-XL 10 mg daily.   Ozempic (0.25 or 0.5 MG/DOSE) 2 MG/1.5ML Sopn Generic drug: Semaglutide(0.25 or 0.'5MG'$ /DOS) Inject 0.5 mg into the skin every Sunday.   primidone 50 MG tablet Commonly known as: MYSOLINE Take 1 tablet (50 mg total) by mouth 2 (two) times daily.   rosuvastatin 10 MG tablet Commonly known as: CRESTOR Take 10 mg by mouth at bedtime.        Allergies:  Allergies  Allergen Reactions   Sulfa Antibiotics Itching and Rash    Family History: Family History  Problem Relation Age of Onset   Heart attack Mother    Heart attack Father     Social History:  reports that he has never smoked. He has never used smokeless tobacco. He reports current alcohol use of about 6.0 standard drinks of alcohol per week. He reports that he does not use drugs.  ROS: All other review of systems were reviewed and are negative except what is noted above in HPI  Physical Exam: BP (!) 151/81   Pulse 69   Constitutional:  Alert and oriented, No acute distress. HEENT: [place] AT, moist mucus membranes.  Trachea midline, no masses. Cardiovascular: No clubbing, cyanosis, or edema. Respiratory: Normal respiratory effort, no increased work of breathing. GI: Abdomen is soft, nontender, nondistended, no abdominal masses GU: No CVA tenderness.  Lymph: No cervical or  inguinal lymphadenopathy. Skin: No rashes, bruises or suspicious lesions. Neurologic: Grossly intact, no focal deficits, moving all 4 extremities. Psychiatric: Normal mood and affect.  Laboratory Data: Lab Results  Component Value Date   WBC 9.4 11/17/2010   HGB 15.0 09/12/2013   HCT 44.0 09/12/2013   MCV 87.8 11/17/2010   PLT 138 (L) 11/17/2010    Lab Results  Component Value Date   CREATININE 0.95 08/11/2022    No results found for: "PSA"  No results found for: "TESTOSTERONE"  Lab Results  Component Value Date   HGBA1C 7.1 (H) 08/11/2022    Urinalysis    Component Value Date/Time   COLORURINE YELLOW 02/26/2010 0930   APPEARANCEUR CLOUDY (A) 02/26/2010 0930   LABSPEC 1.019 02/26/2010 0930   PHURINE 5.5 02/26/2010 0930   GLUCOSEU NEGATIVE 02/26/2010 0930   HGBUR LARGE (  A) 02/26/2010 0930   BILIRUBINUR NEGATIVE 02/26/2010 0930   KETONESUR NEGATIVE 02/26/2010 0930   PROTEINUR 30 (A) 02/26/2010 0930   UROBILINOGEN 0.2 02/26/2010 0930   NITRITE NEGATIVE 02/26/2010 0930   LEUKOCYTESUR SMALL (A) 02/26/2010 0930    Lab Results  Component Value Date   BACTERIA FEW (A) 02/26/2010    Pertinent Imaging:  No results found for this or any previous visit.  No results found for this or any previous visit.  No results found for this or any previous visit.  No results found for this or any previous visit.  Results for orders placed during the hospital encounter of 07/20/17  US Renal  Narrative CLINICAL DATA:  76 year old male with urinary retention. Self catheterization. Initial encounter.  EXAM: RENAL / URINARY TRACT ULTRASOUND COMPLETE  COMPARISON:  None.  FINDINGS: Right Kidney:  Length: 11.3 cm. Echogenicity within normal limits. No mass or hydronephrosis visualized.  Left Kidney:  Length: 11.8 cm. Echogenicity within normal limits. No mass or hydronephrosis visualized.  Bladder:  Decompressed and not adequately  assessed  IMPRESSION: Kidneys unremarkable  Bladder decompressed and not assessed   Electronically Signed By: Genia Del M.D. On: 07/20/2017 12:25  No results found for this or any previous visit.  No results found for this or any previous visit.  No results found for this or any previous visit.   Assessment & Plan:    1. Neurogenic bladder/urge incontinence -RTC 5 months   No follow-ups on file.  Nicolette Bang, MD  [facility] [place]

## 2022-08-28 NOTE — Patient Instructions (Signed)

## 2022-09-07 DIAGNOSIS — R030 Elevated blood-pressure reading, without diagnosis of hypertension: Secondary | ICD-10-CM | POA: Diagnosis not present

## 2022-09-07 DIAGNOSIS — R109 Unspecified abdominal pain: Secondary | ICD-10-CM | POA: Diagnosis not present

## 2022-09-07 DIAGNOSIS — I700 Atherosclerosis of aorta: Secondary | ICD-10-CM | POA: Diagnosis not present

## 2022-09-07 DIAGNOSIS — K5730 Diverticulosis of large intestine without perforation or abscess without bleeding: Secondary | ICD-10-CM | POA: Diagnosis not present

## 2022-09-07 DIAGNOSIS — Z87442 Personal history of urinary calculi: Secondary | ICD-10-CM | POA: Diagnosis not present

## 2022-09-07 DIAGNOSIS — M549 Dorsalgia, unspecified: Secondary | ICD-10-CM | POA: Diagnosis not present

## 2022-09-07 DIAGNOSIS — Z6839 Body mass index (BMI) 39.0-39.9, adult: Secondary | ICD-10-CM | POA: Diagnosis not present

## 2022-09-07 DIAGNOSIS — N200 Calculus of kidney: Secondary | ICD-10-CM | POA: Diagnosis not present

## 2022-09-08 DIAGNOSIS — R300 Dysuria: Secondary | ICD-10-CM | POA: Diagnosis not present

## 2022-09-08 DIAGNOSIS — N390 Urinary tract infection, site not specified: Secondary | ICD-10-CM | POA: Diagnosis not present

## 2022-09-09 ENCOUNTER — Emergency Department (HOSPITAL_COMMUNITY)
Admission: EM | Admit: 2022-09-09 | Discharge: 2022-09-10 | Disposition: A | Discharge status: Home / Self Care | Payer: Medicare Other | Attending: Emergency Medicine | Admitting: Emergency Medicine

## 2022-09-09 ENCOUNTER — Encounter (HOSPITAL_COMMUNITY): Payer: Self-pay | Admitting: Emergency Medicine

## 2022-09-09 DIAGNOSIS — Z8546 Personal history of malignant neoplasm of prostate: Secondary | ICD-10-CM | POA: Diagnosis not present

## 2022-09-09 DIAGNOSIS — E119 Type 2 diabetes mellitus without complications: Secondary | ICD-10-CM | POA: Diagnosis not present

## 2022-09-09 DIAGNOSIS — M545 Low back pain, unspecified: Secondary | ICD-10-CM

## 2022-09-09 DIAGNOSIS — I10 Essential (primary) hypertension: Secondary | ICD-10-CM | POA: Diagnosis not present

## 2022-09-09 DIAGNOSIS — M5459 Other low back pain: Secondary | ICD-10-CM | POA: Diagnosis not present

## 2022-09-09 LAB — COMPREHENSIVE METABOLIC PANEL
ALT: 21 U/L (ref 0–44)
AST: 28 U/L (ref 15–41)
Albumin: 4.2 g/dL (ref 3.5–5.0)
Alkaline Phosphatase: 37 U/L — ABNORMAL LOW (ref 38–126)
Anion gap: 16 — ABNORMAL HIGH (ref 5–15)
BUN: 21 mg/dL (ref 8–23)
CO2: 17 mmol/L — ABNORMAL LOW (ref 22–32)
Calcium: 9.3 mg/dL (ref 8.9–10.3)
Chloride: 103 mmol/L (ref 98–111)
Creatinine, Ser: 1.01 mg/dL (ref 0.61–1.24)
GFR, Estimated: >60 mL/min (ref >60)
Glucose, Bld: 198 mg/dL — ABNORMAL HIGH (ref 70–99)
Potassium: 5.2 mmol/L — ABNORMAL HIGH (ref 3.5–5.1)
Sodium: 136 mmol/L (ref 135–145)
Total Bilirubin: 1.2 mg/dL (ref 0.3–1.2)
Total Protein: 6.8 g/dL (ref 6.5–8.1)

## 2022-09-09 LAB — CBC WITH DIFFERENTIAL/PLATELET
Abs Immature Granulocytes: 0.24 10*3/uL — ABNORMAL HIGH (ref 0.00–0.07)
Basophils Absolute: 0.1 10*3/uL (ref 0.0–0.1)
Basophils Relative: 0 %
Eosinophils Absolute: 0 10*3/uL (ref 0.0–0.5)
Eosinophils Relative: 0 %
HCT: 45.6 % (ref 39.0–52.0)
Hemoglobin: 14.7 g/dL (ref 13.0–17.0)
Immature Granulocytes: 2 %
Lymphocytes Relative: 8 %
Lymphs Abs: 1.2 10*3/uL (ref 0.7–4.0)
MCH: 29.7 pg (ref 26.0–34.0)
MCHC: 32.2 g/dL (ref 30.0–36.0)
MCV: 92.1 fL (ref 80.0–100.0)
Monocytes Absolute: 1 10*3/uL (ref 0.1–1.0)
Monocytes Relative: 7 %
Neutro Abs: 11.5 10*3/uL — ABNORMAL HIGH (ref 1.7–7.7)
Neutrophils Relative %: 83 %
Platelets: 33 10*3/uL — ABNORMAL LOW (ref 150–400)
RBC: 4.95 MIL/uL (ref 4.22–5.81)
RDW: 14.6 % (ref 11.5–15.5)
WBC: 14 10*3/uL — ABNORMAL HIGH (ref 4.0–10.5)
nRBC: 0 % (ref 0.0–0.2)

## 2022-09-09 LAB — LIPASE, BLOOD: Lipase: 40 U/L (ref 11–51)

## 2022-09-09 MED ORDER — DIAZEPAM 5 MG PO TABS
5.0000 mg | ORAL_TABLET | Freq: Once | ORAL | Status: AC
Start: 2022-09-09 — End: 2022-09-09
  Administered 2022-09-09: 5 mg via ORAL
  Filled 2022-09-09: qty 1

## 2022-09-09 NOTE — ED Triage Notes (Signed)
Patient here with complaint of back pain that started approximately one week ago. Pain is exacerbated by movement, especially rotation of the torso. Patient is alert, oriented, and in no apparent distress at this time.

## 2022-09-09 NOTE — ED Provider Triage Note (Signed)
Emergency Medicine Provider Triage Evaluation Note  DACE DENN , a 76 y.o. male  was evaluated in triage.  Pt complains of back pain. Pt report pain to lower back ongoing over 1 week.  Pain worse with movement.  No fever, chills, dysuria, radicular leg pain.  Had abd/pelvis CT by PCP <1wk for same and it was negative  Review of Systems  Positive: As above Negative: As above  Physical Exam  BP (!) 176/73   Pulse 76   Temp 97.6 F (36.4 C) (Oral)   Resp 18   SpO2 100%  Gen:   Awake, no distress   Resp:  Normal effort  MSK:   Moves extremities without difficulty  Other:    Medical Decision Making  Medically screening exam initiated at 6:56 PM.  Appropriate orders placed.  [name] was informed that the remainder of the evaluation will be completed by another provider, this initial triage assessment does not replace that evaluation, and the importance of remaining in the ED until their evaluation is complete.     Domenic Moras, PA-C 09/09/22 1902

## 2022-09-10 MED ORDER — LIDOCAINE 5 % EX PTCH: 1.0000 | MEDICATED_PATCH | CUTANEOUS | 0 refills | Status: DC

## 2022-09-10 MED ORDER — DIAZEPAM 5 MG PO TABS: 5.0000 mg | ORAL_TABLET | Freq: Two times a day (BID) | ORAL | 0 refills | Status: DC | PRN

## 2022-09-10 NOTE — Discharge Instructions (Addendum)
You were evaluated in the Emergency Department and after careful evaluation, we did not find any emergent condition requiring admission or further testing in the hospital.  Your exam/testing today is overall reassuring.  Symptoms seem to be due to muscle strain or spasm.  Take Tylenol and Advil throughout the day to help with pain.  Can use the numbing patches daily as well.  Can use the Valium for pain keeping you from sleeping.  Use caution as it can cause drowsiness.  Do not mix with the Flexeril or the Norco.  Please return to the Emergency Department if you experience any worsening of your condition.   Thank you for allowing [place] to be a part of your care.

## 2022-09-10 NOTE — ED Provider Notes (Signed)
[facility] Emergency Department Provider Note MRN:  [id number]  Arrival date & time: 09/10/22     Chief Complaint   Back Pain   History of Present Illness   Marc Burns is a 76 y.o. year-old male with a history of prostate cancer, diabetes presenting to the ED with chief complaint of back pain.  Pain to the right lumbar back for the past 4 or 5 days, becomes very severe with certain positions.  Had trouble getting off of the toilet the other day.  Denies numbness or weakness to the arms or legs, no bowel or bladder dysfunction.  Chronic Foley catheter.  No fever.  Review of Systems  A thorough review of systems was obtained and all systems are negative except as noted in the HPI and PMH.   Patient's Health History    Past Medical History:  Diagnosis Date   Arthritis    Bladder neck contracture    Complication of anesthesia    Difficult intubation   Diabetes mellitus without complication ([place])    Difficult intubation    Foley catheter in place    GERD (gastroesophageal reflux disease)    History of gastric ulcer    2007   History of kidney stones    History of prostate cancer    2004--  S/P PROSTATECTOMY   Hypertension    Urinary incontinence    Wears glasses     Past Surgical History:  Procedure Laterality Date   BALLOON DILATION N/A 09/12/2013   Procedure: CYSTO BALLOON DILATION/RETROGRADE URETHROGRAM;  Surgeon: Reece Packer, MD;  Location: [facility];  Service: Urology;  Laterality: N/A;   BOTOX INJECTION N/A 01/08/2022   Procedure: BOTOX INJECTION;  Surgeon: Cleon Gustin, MD;  Location: [facility];  Service: Urology;  Laterality: N/A;   BOTOX INJECTION N/A 08/13/2022   Procedure: BOTOX INJECTION;  Surgeon: Cleon Gustin, MD;  Location: [facility];  Service: Urology;  Laterality: N/A;   CYSTO/  URETHRAL DILATION/ BLADDER BX/ BILATERAL RETROGRADE PYELOGRAM  03-03-2010   CYSTO/ BALLOON DILATION AND VAPORIZATION  BLADDER NECK & PROSTATIC FOSSA  04-28-2010   CYSTO/ BALLOON DILATION BLADDER NECK CONTRACTURE  07-23-2011;   11-17-2010;  08-08-2010   CYSTOSCOPY N/A 01/08/2022   Procedure: CYSTOSCOPY WITH INTRAVESICAL BOTOX 100 UNITS;  Surgeon: Cleon Gustin, MD;  Location: [facility];  Service: Urology;  Laterality: N/A;   CYSTOSCOPY WITH DIRECT VISION INTERNAL URETHROTOMY N/A 04/24/2021   Procedure: CYSTOSCOPY WITH DIRECT VISION INTERNAL URETHROTOMY;  Surgeon: Cleon Gustin, MD;  Location: [facility];  Service: Urology;  Laterality: N/A;   CYSTOSCOPY WITH INJECTION N/A 08/13/2022   Procedure: CYSTOSCOPY WITH INJECTION;  Surgeon: Cleon Gustin, MD;  Location: [facility];  Service: Urology;  Laterality: N/A;   FULGURATION OF LESION  04/24/2021   Procedure: FULGURATION OF BLADDER NECK;  Surgeon: Cleon Gustin, MD;  Location: [facility];  Service: Urology;;   MITOMYCIN C INJECTION  04/24/2021   Procedure: MITOMYCIN C INJECTION ANTERIOR BLADDER WALL;  Surgeon: Cleon Gustin, MD;  Location: [facility];  Service: Urology;;   PROSTATECTOMY  2004   RADICAL   TONSILLECTOMY  AS CHILD   TRANSURETHRAL INCISION AND RESECTION OF BLADDER NECK CONTRACTURE  [id number]   UMBILICAL HERNIA REPAIR      Family History  Problem Relation Age of Onset   Heart attack Mother    Heart attack Father     Social History   Socioeconomic History   Marital  status: Married    Spouse name: Arville Go   Number of children: Not on file   Years of education: Not on file   Highest education level: Not on file  Occupational History   Not on file  Tobacco Use   Smoking status: Never   Smokeless tobacco: Never  Vaping Use   Vaping Use: Never used  Substance and Sexual Activity   Alcohol use: Yes    Alcohol/week: 6.0 standard drinks of alcohol    Types: 6 Cans of beer per week   Drug use: No   Sexual activity: Not Currently  Other Topics Concern   Not on file  Social History Narrative   Not on file   Social Determinants of  Health   Financial Resource Strain: Not on file  Food Insecurity: Not on file  Transportation Needs: Not on file  Physical Activity: Not on file  Stress: Not on file  Social Connections: Not on file  Intimate Partner Violence: Not on file     Physical Exam   Vitals:   09/10/22 0129 09/10/22 0424  BP: (!) 145/67 (!) 151/75  Pulse: (!) 54 (!) 57  Resp: 18 17  Temp:  97.8 F (36.6 C)  SpO2: 95% 97%    CONSTITUTIONAL: Well-appearing, NAD NEURO/PSYCH:  Alert and oriented x 3, normal and symmetric strength and sensation, normal coordination, normal speech EYES:  eyes equal and reactive ENT/NECK:  no LAD, no JVD CARDIO: Regular rate, well-perfused, normal S1 and S2 PULM:  CTAB no wheezing or rhonchi GI/GU:  non-distended, non-tender MSK/SPINE:  No gross deformities, no edema SKIN:  no rash, atraumatic   *Additional and/or pertinent findings included in MDM below  Diagnostic and Interventional Summary    EKG Interpretation  Date/Time:    Ventricular Rate:    PR Interval:    QRS Duration:   QT Interval:    QTC Calculation:   R Axis:     Text Interpretation:         Labs Reviewed  CBC WITH DIFFERENTIAL/PLATELET - Abnormal; Notable for the following components:      Result Value   WBC 14.0 (*)    Platelets 33 (*)    Neutro Abs 11.5 (*)    Abs Immature Granulocytes 0.24 (*)    All other components within normal limits  COMPREHENSIVE METABOLIC PANEL - Abnormal; Notable for the following components:   Potassium 5.2 (*)    CO2 17 (*)    Glucose, Bld 198 (*)    Alkaline Phosphatase 37 (*)    Anion gap 16 (*)    All other components within normal limits  LIPASE, BLOOD  URINALYSIS, ROUTINE W REFLEX MICROSCOPIC    No orders to display    Medications  diazepam (VALIUM) tablet 5 mg (5 mg Oral Given 09/09/22 1921)     Procedures  /  Critical Care Procedures  ED Course and Medical Decision Making  Initial Impression and Ddx Back pain, no red flag symptoms to  suggest myelopathy.  Had a CT scan 2 or 3 days ago that did not show any obstructive kidney stones, no obvious bony abnormality.  Does not have symptoms of UTI.  Pain seems very muscular on exam.  Needs better pain control.  Was prescribed Norco, Flexeril, prednisone burst, cefdinir by PCP.  Was given Valium here in triage and it seemed to help him quite a bit.  Will provide a short course, advised not to take with the other sedating medicines.  Past medical/surgical  history that increases complexity of ED encounter: Prostate cancer  Interpretation of Diagnostics I personally reviewed the laboratory assessment and my interpretation is as follows: Mild leukocytosis, acidosis and hyperkalemia noted on metabolic panel, favored to be due to hemolysis.    Patient Reassessment and Ultimate Disposition/Management     Discharge  Patient management required discussion with the following services or consulting groups:  None  Complexity of Problems Addressed Acute illness or injury that poses threat of life of bodily function  Additional Data Reviewed and Analyzed Further history obtained from: Further history from spouse/family member  Additional Factors Impacting ED Encounter Risk Prescriptions  Barth Kirks. Sedonia Small, [place] [email or web address]  Final Clinical Impressions(s) / ED Diagnoses     ICD-10-CM   1. Acute right-sided low back pain without sciatica  M54.50       ED Discharge Orders          Ordered    diazepam (VALIUM) 5 MG tablet  Every 12 hours PRN        09/10/22 0700    lidocaine (LIDODERM) 5 %  Every 24 hours        09/10/22 0701             Discharge Instructions Discussed with and Provided to Patient:    Discharge Instructions      You were evaluated in the Emergency Department and after careful evaluation, we did not find any emergent condition requiring admission or further testing in the  hospital.  Your exam/testing today is overall reassuring.  Symptoms seem to be due to muscle strain or spasm.  Take Tylenol and Advil throughout the day to help with pain.  Can use the numbing patches daily as well.  Can use the Valium for pain keeping you from sleeping.  Use caution as it can cause drowsiness.  Do not mix with the Flexeril or the Norco.  Please return to the Emergency Department if you experience any worsening of your condition.   Thank you for allowing [place] to be a part of your care.      Maudie Flakes, MD 09/10/22 [phone]

## 2022-09-17 ENCOUNTER — Other Ambulatory Visit (HOSPITAL_COMMUNITY): Payer: Self-pay | Admitting: Student

## 2022-09-17 ENCOUNTER — Other Ambulatory Visit: Payer: Self-pay | Admitting: Student

## 2022-09-17 DIAGNOSIS — M4310 Spondylolisthesis, site unspecified: Secondary | ICD-10-CM | POA: Diagnosis not present

## 2022-10-09 DIAGNOSIS — Z23 Encounter for immunization: Secondary | ICD-10-CM | POA: Diagnosis not present

## 2022-10-14 ENCOUNTER — Ambulatory Visit (HOSPITAL_COMMUNITY)
Admission: RE | Admit: 2022-10-14 | Discharge: 2022-10-14 | Disposition: A | Discharge status: Home / Self Care | Payer: Medicare Other | Source: Ambulatory Visit | Attending: Student | Admitting: Student

## 2022-10-14 DIAGNOSIS — M4310 Spondylolisthesis, site unspecified: Secondary | ICD-10-CM | POA: Diagnosis not present

## 2022-10-14 DIAGNOSIS — M4316 Spondylolisthesis, lumbar region: Secondary | ICD-10-CM | POA: Diagnosis not present

## 2022-10-14 DIAGNOSIS — M545 Low back pain, unspecified: Secondary | ICD-10-CM | POA: Diagnosis not present

## 2022-10-20 DIAGNOSIS — K219 Gastro-esophageal reflux disease without esophagitis: Secondary | ICD-10-CM | POA: Diagnosis not present

## 2022-10-20 DIAGNOSIS — I10 Essential (primary) hypertension: Secondary | ICD-10-CM | POA: Diagnosis not present

## 2022-10-20 DIAGNOSIS — E1165 Type 2 diabetes mellitus with hyperglycemia: Secondary | ICD-10-CM | POA: Diagnosis not present

## 2022-10-20 DIAGNOSIS — E785 Hyperlipidemia, unspecified: Secondary | ICD-10-CM | POA: Diagnosis not present

## 2022-10-21 DIAGNOSIS — M48061 Spinal stenosis, lumbar region without neurogenic claudication: Secondary | ICD-10-CM | POA: Diagnosis not present

## 2022-10-21 DIAGNOSIS — Z6839 Body mass index (BMI) 39.0-39.9, adult: Secondary | ICD-10-CM | POA: Diagnosis not present

## 2022-10-21 DIAGNOSIS — M4310 Spondylolisthesis, site unspecified: Secondary | ICD-10-CM | POA: Diagnosis not present

## 2022-10-26 DIAGNOSIS — I10 Essential (primary) hypertension: Secondary | ICD-10-CM | POA: Diagnosis not present

## 2022-10-26 DIAGNOSIS — E1140 Type 2 diabetes mellitus with diabetic neuropathy, unspecified: Secondary | ICD-10-CM | POA: Diagnosis not present

## 2022-10-26 DIAGNOSIS — E1165 Type 2 diabetes mellitus with hyperglycemia: Secondary | ICD-10-CM | POA: Diagnosis not present

## 2022-10-26 DIAGNOSIS — E785 Hyperlipidemia, unspecified: Secondary | ICD-10-CM | POA: Diagnosis not present

## 2022-10-26 DIAGNOSIS — Z6839 Body mass index (BMI) 39.0-39.9, adult: Secondary | ICD-10-CM | POA: Diagnosis not present

## 2022-10-26 DIAGNOSIS — H6122 Impacted cerumen, left ear: Secondary | ICD-10-CM | POA: Diagnosis not present

## 2022-10-26 DIAGNOSIS — G250 Essential tremor: Secondary | ICD-10-CM | POA: Diagnosis not present

## 2022-11-03 DIAGNOSIS — M4310 Spondylolisthesis, site unspecified: Secondary | ICD-10-CM | POA: Diagnosis not present

## 2022-11-17 DIAGNOSIS — M4310 Spondylolisthesis, site unspecified: Secondary | ICD-10-CM | POA: Diagnosis not present

## 2022-12-02 DIAGNOSIS — Z6839 Body mass index (BMI) 39.0-39.9, adult: Secondary | ICD-10-CM | POA: Diagnosis not present

## 2022-12-02 DIAGNOSIS — M48061 Spinal stenosis, lumbar region without neurogenic claudication: Secondary | ICD-10-CM | POA: Diagnosis not present

## 2022-12-17 DIAGNOSIS — H25813 Combined forms of age-related cataract, bilateral: Secondary | ICD-10-CM | POA: Diagnosis not present

## 2023-01-20 DIAGNOSIS — E785 Hyperlipidemia, unspecified: Secondary | ICD-10-CM | POA: Diagnosis not present

## 2023-01-20 DIAGNOSIS — I10 Essential (primary) hypertension: Secondary | ICD-10-CM | POA: Diagnosis not present

## 2023-01-20 DIAGNOSIS — K219 Gastro-esophageal reflux disease without esophagitis: Secondary | ICD-10-CM | POA: Diagnosis not present

## 2023-01-20 DIAGNOSIS — E1165 Type 2 diabetes mellitus with hyperglycemia: Secondary | ICD-10-CM | POA: Diagnosis not present

## 2023-01-21 DIAGNOSIS — E1165 Type 2 diabetes mellitus with hyperglycemia: Secondary | ICD-10-CM | POA: Diagnosis not present

## 2023-01-21 DIAGNOSIS — E7849 Other hyperlipidemia: Secondary | ICD-10-CM | POA: Diagnosis not present

## 2023-01-21 DIAGNOSIS — D519 Vitamin B12 deficiency anemia, unspecified: Secondary | ICD-10-CM | POA: Diagnosis not present

## 2023-01-21 DIAGNOSIS — I10 Essential (primary) hypertension: Secondary | ICD-10-CM | POA: Diagnosis not present

## 2023-01-21 DIAGNOSIS — E559 Vitamin D deficiency, unspecified: Secondary | ICD-10-CM | POA: Diagnosis not present

## 2023-01-21 DIAGNOSIS — E1169 Type 2 diabetes mellitus with other specified complication: Secondary | ICD-10-CM | POA: Diagnosis not present

## 2023-01-21 DIAGNOSIS — E7801 Familial hypercholesterolemia: Secondary | ICD-10-CM | POA: Diagnosis not present

## 2023-01-21 DIAGNOSIS — E039 Hypothyroidism, unspecified: Secondary | ICD-10-CM | POA: Diagnosis not present

## 2023-01-21 DIAGNOSIS — K219 Gastro-esophageal reflux disease without esophagitis: Secondary | ICD-10-CM | POA: Diagnosis not present

## 2023-01-25 DIAGNOSIS — Z23 Encounter for immunization: Secondary | ICD-10-CM | POA: Diagnosis not present

## 2023-01-29 ENCOUNTER — Ambulatory Visit: Payer: Medicare Other | Admitting: Urology

## 2023-02-01 ENCOUNTER — Ambulatory Visit (INDEPENDENT_AMBULATORY_CARE_PROVIDER_SITE_OTHER): Payer: Medicare Other | Admitting: Urology

## 2023-02-01 VITALS — BP 129/70 | HR 64

## 2023-02-01 DIAGNOSIS — R32 Unspecified urinary incontinence: Secondary | ICD-10-CM

## 2023-02-01 DIAGNOSIS — Z87440 Personal history of urinary (tract) infections: Secondary | ICD-10-CM | POA: Diagnosis not present

## 2023-02-01 DIAGNOSIS — N3941 Urge incontinence: Secondary | ICD-10-CM

## 2023-02-01 DIAGNOSIS — N319 Neuromuscular dysfunction of bladder, unspecified: Secondary | ICD-10-CM | POA: Diagnosis not present

## 2023-02-01 DIAGNOSIS — N390 Urinary tract infection, site not specified: Secondary | ICD-10-CM

## 2023-02-01 NOTE — Progress Notes (Unsigned)
02/01/2023 11:16 AM   Marc Burns 07-10-1946 [id number]  Referring provider: Curlene Labrum, MD [place],  [place] [id number]  Followup neurogenic bladder   HPI: Marc Burns is a 77yo here for followup for neurogenic bladder and urge incontinence. Over the past 2 weeks he has noted increased bladder spasms and he has to flush his foley catheter more often. He last had intravesical botox in 07/2022.    PMH: Past Medical History:  Diagnosis Date   Arthritis    Bladder neck contracture    Complication of anesthesia    Difficult intubation   Diabetes mellitus without complication ([place])    Difficult intubation    Foley catheter in place    GERD (gastroesophageal reflux disease)    History of gastric ulcer    2007   History of kidney stones    History of prostate cancer    2004--  S/P PROSTATECTOMY   Hypertension    Urinary incontinence    Wears glasses     Surgical History: Past Surgical History:  Procedure Laterality Date   BALLOON DILATION N/A 09/12/2013   Procedure: CYSTO BALLOON DILATION/RETROGRADE URETHROGRAM;  Surgeon: Reece Packer, MD;  Location: [facility];  Service: Urology;  Laterality: N/A;   BOTOX INJECTION N/A 01/08/2022   Procedure: BOTOX INJECTION;  Surgeon: Cleon Gustin, MD;  Location: [facility];  Service: Urology;  Laterality: N/A;   BOTOX INJECTION N/A 08/13/2022   Procedure: BOTOX INJECTION;  Surgeon: Cleon Gustin, MD;  Location: [facility];  Service: Urology;  Laterality: N/A;   CYSTO/  URETHRAL DILATION/ BLADDER BX/ BILATERAL RETROGRADE PYELOGRAM  03-03-2010   CYSTO/ BALLOON DILATION AND VAPORIZATION BLADDER NECK & PROSTATIC FOSSA  04-28-2010   CYSTO/ BALLOON DILATION BLADDER NECK CONTRACTURE  07-23-2011;   11-17-2010;  08-08-2010   CYSTOSCOPY N/A 01/08/2022   Procedure: CYSTOSCOPY WITH INTRAVESICAL BOTOX 100 UNITS;  Surgeon: Cleon Gustin, MD;  Location: [facility];  Service: Urology;  Laterality: N/A;    CYSTOSCOPY WITH DIRECT VISION INTERNAL URETHROTOMY N/A 04/24/2021   Procedure: CYSTOSCOPY WITH DIRECT VISION INTERNAL URETHROTOMY;  Surgeon: Cleon Gustin, MD;  Location: [facility];  Service: Urology;  Laterality: N/A;   CYSTOSCOPY WITH INJECTION N/A 08/13/2022   Procedure: CYSTOSCOPY WITH INJECTION;  Surgeon: Cleon Gustin, MD;  Location: [facility];  Service: Urology;  Laterality: N/A;   FULGURATION OF LESION  04/24/2021   Procedure: FULGURATION OF BLADDER NECK;  Surgeon: Cleon Gustin, MD;  Location: [facility];  Service: Urology;;   MITOMYCIN C INJECTION  04/24/2021   Procedure: MITOMYCIN C INJECTION ANTERIOR BLADDER WALL;  Surgeon: Cleon Gustin, MD;  Location: [facility];  Service: Urology;;   PROSTATECTOMY  2004   RADICAL   TONSILLECTOMY  AS CHILD   TRANSURETHRAL INCISION AND RESECTION OF BLADDER NECK CONTRACTURE  99991111   UMBILICAL HERNIA REPAIR      Home Medications:  Allergies as of 02/01/2023       Reactions   Sulfa Antibiotics Itching, Rash        Medication List        Accurate as of February 01, 2023 11:16 AM. If you have any questions, ask your nurse or doctor.          amLODipine-benazepril 5-20 MG capsule Commonly known as: LOTREL Take 1 capsule by mouth every morning.   aspirin EC 81 MG tablet Take 81 mg by mouth daily. Swallow whole.   CENTRUM SILVER ADULT  50+ PO Take 1 tablet by mouth daily.   ciprofloxacin 250 MG tablet Commonly known as: Cipro Take 1 tablet (250 mg total) by mouth 2 (two) times daily. What changed:  when to take this additional instructions   clotrimazole-betamethasone cream Commonly known as: Lotrisone Apply 1 application. topically 2 (two) times daily as needed (itching).   diazepam 5 MG tablet Commonly known as: VALIUM Take 1 tablet (5 mg total) by mouth every 12 (twelve) hours as needed for muscle spasms.   empagliflozin 25 MG Tabs tablet Commonly known as: JARDIANCE Take 25 mg by mouth daily.   furosemide  20 MG tablet Commonly known as: LASIX Take 20 mg by mouth daily.   gabapentin 300 MG capsule Commonly known as: NEURONTIN Take 300-600 mg by mouth See admin instructions. 600 mg in the morning, 300 mg midday, 600 mg at bedtime   Gemtesa 75 MG Tabs Generic drug: Vibegron Take 75 mg by mouth daily.   ibuprofen 200 MG tablet Commonly known as: ADVIL Take 400 mg by mouth every 6 (six) hours as needed for moderate pain or headache.   lidocaine 5 % Commonly known as: Lidoderm Place 1 patch onto the skin daily. Remove & Discard patch within 12 hours or as directed by MD   metoprolol tartrate 25 MG tablet Commonly known as: LOPRESSOR Take 25 mg by mouth 2 (two) times daily.   mupirocin ointment 2 % Commonly known as: BACTROBAN Apply 1 application topically 2 (two) times daily as needed for wound care.   omeprazole 20 MG capsule Commonly known as: PRILOSEC Take 20 mg by mouth daily.   oxybutynin 10 MG 24 hr tablet Commonly known as: DITROPAN-XL 10 mg daily.   Ozempic (0.25 or 0.5 MG/DOSE) 2 MG/1.5ML Sopn Generic drug: Semaglutide(0.25 or 0.5MG/DOS) Inject 0.5 mg into the skin every Sunday.   primidone 50 MG tablet Commonly known as: MYSOLINE Take 1 tablet (50 mg total) by mouth 2 (two) times daily.   rosuvastatin 10 MG tablet Commonly known as: CRESTOR Take 10 mg by mouth at bedtime.        Allergies:  Allergies  Allergen Reactions   Sulfa Antibiotics Itching and Rash    Family History: Family History  Problem Relation Age of Onset   Heart attack Mother    Heart attack Father     Social History:  reports that he has never smoked. He has never used smokeless tobacco. He reports current alcohol use of about 6.0 standard drinks of alcohol per week. He reports that he does not use drugs.  ROS: All other review of systems were reviewed and are negative except what is noted above in HPI  Physical Exam: BP 129/70   Pulse 64   Constitutional:  Alert and  oriented, No acute distress. HEENT: [place] AT, moist mucus membranes.  Trachea midline, no masses. Cardiovascular: No clubbing, cyanosis, or edema. Respiratory: Normal respiratory effort, no increased work of breathing. GI: Abdomen is soft, nontender, nondistended, no abdominal masses GU: No CVA tenderness.  Lymph: No cervical or inguinal lymphadenopathy. Skin: No rashes, bruises or suspicious lesions. Neurologic: Grossly intact, no focal deficits, moving all 4 extremities. Psychiatric: Normal mood and affect.  Laboratory Data: Lab Results  Component Value Date   WBC 14.0 (H) 09/09/2022   HGB 14.7 09/09/2022   HCT 45.6 09/09/2022   MCV 92.1 09/09/2022   PLT 33 (L) 09/09/2022    Lab Results  Component Value Date   CREATININE 1.01 09/09/2022    No  results found for: "PSA"  No results found for: "TESTOSTERONE"  Lab Results  Component Value Date   HGBA1C 7.1 (H) 08/11/2022    Urinalysis    Component Value Date/Time   COLORURINE YELLOW 02/26/2010 0930   APPEARANCEUR CLOUDY (A) 02/26/2010 0930   LABSPEC 1.019 02/26/2010 0930   PHURINE 5.5 02/26/2010 0930   GLUCOSEU NEGATIVE 02/26/2010 0930   HGBUR LARGE (A) 02/26/2010 0930   BILIRUBINUR NEGATIVE 02/26/2010 0930   KETONESUR NEGATIVE 02/26/2010 0930   PROTEINUR 30 (A) 02/26/2010 0930   UROBILINOGEN 0.2 02/26/2010 0930   NITRITE NEGATIVE 02/26/2010 0930   LEUKOCYTESUR SMALL (A) 02/26/2010 0930    Lab Results  Component Value Date   BACTERIA FEW (A) 02/26/2010    Pertinent Imaging:  No results found for this or any previous visit.  No results found for this or any previous visit.  No results found for this or any previous visit.  No results found for this or any previous visit.  Results for orders placed during the hospital encounter of 07/20/17  US Renal  Narrative CLINICAL DATA:  77 year old male with urinary retention. Self catheterization. Initial encounter.  EXAM: RENAL / URINARY TRACT ULTRASOUND  COMPLETE  COMPARISON:  None.  FINDINGS: Right Kidney:  Length: 11.3 cm. Echogenicity within normal limits. No mass or hydronephrosis visualized.  Left Kidney:  Length: 11.8 cm. Echogenicity within normal limits. No mass or hydronephrosis visualized.  Bladder:  Decompressed and not adequately assessed  IMPRESSION: Kidneys unremarkable  Bladder decompressed and not assessed   Electronically Signed By: Genia Del M.D. On: 07/20/2017 12:25  No valid procedures specified. No results found for this or any previous visit.  No results found for this or any previous visit.   Assessment & Plan:    1. Neurogenic bladder We will schedule for intravesical botox 200 units - Urinalysis, Routine w reflex microscopic  2. Urinary incontinence, unspecified type -We will schedule for intravesical botox 200 units  3. Recurrent UTI -no UTIs since last visit. We will continue observation .    No follow-ups on file.  Nicolette Bang, MD  [facility] [place]

## 2023-02-02 ENCOUNTER — Telehealth: Payer: Self-pay

## 2023-02-02 NOTE — Telephone Encounter (Signed)
I called to discuss surgery appointment.  Patient not available- wife will have patient return my call.

## 2023-02-02 NOTE — Telephone Encounter (Signed)
I spoke with Marc Burns. We have discussed possible surgery dates and 03/04/2023 was agreed upon by all parties. Patient given information about surgery date, what to expect pre-operatively and post operatively.    We discussed that a pre-op nurse will be calling to set up the pre-op visit that will take place prior to surgery. Informed patient that our office will communicate any additional care to be provided after surgery.    Patients questions or concerns were discussed during our call. Advised to call our office should there be any additional information, questions or concerns that arise. Patient verbalized understanding.    Instructions mailed as well.

## 2023-02-03 ENCOUNTER — Encounter: Payer: Self-pay | Admitting: Urology

## 2023-02-03 DIAGNOSIS — E1165 Type 2 diabetes mellitus with hyperglycemia: Secondary | ICD-10-CM | POA: Diagnosis not present

## 2023-02-03 DIAGNOSIS — Z0001 Encounter for general adult medical examination with abnormal findings: Secondary | ICD-10-CM | POA: Diagnosis not present

## 2023-02-03 DIAGNOSIS — G250 Essential tremor: Secondary | ICD-10-CM | POA: Diagnosis not present

## 2023-02-03 DIAGNOSIS — I10 Essential (primary) hypertension: Secondary | ICD-10-CM | POA: Diagnosis not present

## 2023-02-03 DIAGNOSIS — E1140 Type 2 diabetes mellitus with diabetic neuropathy, unspecified: Secondary | ICD-10-CM | POA: Diagnosis not present

## 2023-02-03 DIAGNOSIS — E785 Hyperlipidemia, unspecified: Secondary | ICD-10-CM | POA: Diagnosis not present

## 2023-02-03 DIAGNOSIS — H6122 Impacted cerumen, left ear: Secondary | ICD-10-CM | POA: Diagnosis not present

## 2023-02-03 DIAGNOSIS — Z6841 Body Mass Index (BMI) 40.0 and over, adult: Secondary | ICD-10-CM | POA: Diagnosis not present

## 2023-02-03 NOTE — Patient Instructions (Signed)
Botulinum Toxin Bladder Injection  A botulinum toxin bladder injection is a procedure to treat an overactive bladder. During the procedure, a drug called botulinum toxin is injected into the bladder through a long, thin needle. This drug relaxes the bladder muscles and reduces overactivity. You may need this procedure if your medicines are not working or you cannot take them. The procedure may be repeated as needed. The treatment is done once and it usually lasts for 6 months. Your health care provider will monitor you to see how well you respond. Tell a health care provider about: Any allergies you have. All medicines you are taking, including vitamins, herbs, eye drops, creams, and over-the-counter medicines. Any problems you or family members have had with anesthetic medicines. Any bleeding problems you have. Any surgeries you have had. Any medical conditions you have. Any previous reactions to a botulinum toxin injection. Any symptoms of urinary tract infection. These include chills, fever, a burning feeling when passing urine, and needing to pass urine often. Whether you are pregnant or may be pregnant. What are the risks? Generally this is a safe procedure. However, problems may occur, including: Not being able to pass urine. If this happens, you may need to have your bladder emptied with a thin tube (urinary catheter). Bleeding. Urinary tract infection. Allergic reaction to the botulinum toxin. Pain or burning when passing urine. Damage to nearby structures or organs. What happens before the procedure? When to stop eating and drinking Follow instructions from your health care provider about what you may eat and drink before your procedure. These may include: 8 hours before the procedure Stop eating most foods. Do not eat meat, fried foods, or fatty foods. Eat only light foods, such as toast or crackers. All liquids are okay except energy drinks and alcohol. 6 hours before the  procedure Stop eating. Drink only clear liquids, such as water, clear fruit juice, black coffee, plain tea, and sports drinks. Do not drink energy drinks or alcohol. 2 hours before the procedure Stop drinking all liquids. You may be allowed to take medicines with small sips of water. If you do not follow your health care provider's instructions, your procedure may be delayed or canceled. Medicines Ask your health care provider about: Changing or stopping your regular medicines. This is especially important if you are taking diabetes medicines or blood thinners. Taking medicines such as aspirin and ibuprofen. These medicines can thin your blood. Do not take these medicines unless your health care provider tells you to take them. Taking over-the-counter medicines, vitamins, herbs, and supplements. General instructions Ask your health care provider what steps will be taken to help prevent infection. These steps may include: Removing hair at the procedure site. Washing skin with a germ-killing soap. Taking antibiotic medicine. If you will be going home right after the procedure, plan to have a responsible adult: Take you home from the hospital or clinic. You will not be allowed to drive. Care for you for the time you are told. What happens during the procedure?  You will be asked to empty your bladder. An IV will be inserted into one of your veins. You will be given one or more of the following: A medicine to help you relax (sedative). A medicine to numb the area (local anesthetic). A medicine to make you fall asleep (general anesthetic). A long, thin scope called a cystoscope will be passed into your bladder through the part of the body that carries urine from your bladder (urethra). The cystoscope  will be used to fill your bladder with water. A long needle will be passed through the cystoscope and into the bladder. The botulinum toxin will be injected into your bladder. It may be  injected into multiple areas of your bladder. The cystoscope will be removed and your bladder will be emptied with a urinary catheter. The procedure may vary among health care providers and hospitals. What can I expect after the procedure? After your procedure, it is common to have: Blood-tinged urine. Burning or soreness when you pass urine. Follow these instructions at home: Medicines Take over-the-counter and prescription medicines only as told by your health care provider. If you were prescribed an antibiotic medicine, take it as told by your health care provider. Do not stop using the antibiotic even if you start to feel better. General instructions  If you were given a sedative during the procedure, it can affect you for several hours. Do not drive or operate machinery until your health care provider says that it is safe. Drink enough fluid to keep your urine pale yellow. Return to your normal activities as told by your health care provider. Ask your health care provider what activities are safe for you. Keep all follow-up visits. Contact a health care provider if you have: A fever or chills. Blood-tinged urine for more than one day after your procedure. Worsening pain or burning when you pass urine. Pain or burning when passing urine for more than two days after your procedure. Trouble emptying your bladder. Get help right away if you: Have bright red blood in your urine. Are unable to pass urine. Summary A botulinum toxin bladder injection is a procedure to treat an overactive bladder. This is generally a safe procedure. However, problems may occur, including not being able to pass urine, bleeding, infection, pain, and an allergic reaction to the botulinum toxin. You will be told when to stop eating and drinking, and what medicines to change or stop. Follow instructions carefully. After the procedure, it is common to have blood in your urine and to have soreness or burning when  passing urine. Contact a health care provider if you have a fever, blood in your urine for more than a few days, or trouble passing urine. Get help right away if you have bright red blood in your urine, or if you are unable to pass urine. This information is not intended to replace advice given to you by your health care provider. Make sure you discuss any questions you have with your health care provider. Document Revised: 06/13/2021 Document Reviewed: 06/13/2021 Elsevier Patient Education  Marc Burns.

## 2023-02-05 DIAGNOSIS — E1169 Type 2 diabetes mellitus with other specified complication: Secondary | ICD-10-CM | POA: Diagnosis not present

## 2023-02-11 ENCOUNTER — Encounter: Payer: Self-pay | Admitting: Family Medicine

## 2023-02-11 ENCOUNTER — Telehealth: Payer: Self-pay | Admitting: Family Medicine

## 2023-02-11 NOTE — Telephone Encounter (Signed)
Unable to LVM, no VM box, sent letter in mail informing pt of need to reschedule 05/26/23 appointment - NP out

## 2023-02-18 DIAGNOSIS — I10 Essential (primary) hypertension: Secondary | ICD-10-CM | POA: Diagnosis not present

## 2023-02-18 DIAGNOSIS — E785 Hyperlipidemia, unspecified: Secondary | ICD-10-CM | POA: Diagnosis not present

## 2023-02-18 DIAGNOSIS — E1165 Type 2 diabetes mellitus with hyperglycemia: Secondary | ICD-10-CM | POA: Diagnosis not present

## 2023-02-26 NOTE — Patient Instructions (Signed)
Your procedure is scheduled on: 03/04/2023  Report to [facility] at  10:30   AM.  Call this number if you have problems the morning of surgery: [phone]   Remember:   Do not Eat or Drink after midnight         No Smoking the morning of surgery  :  Take these medicines the morning of surgery with A SIP OF WATER:valium if needed, gabapentin, metoprolol, omeprazole, primidone, and gemtesa    Do not wear jewelry, make-up or nail polish.  Do not wear lotions, powders, or perfumes. You may wear deodorant.  Do not shave 48 hours prior to surgery. Men may shave face and neck.  Do not bring valuables to the hospital.  Contacts, dentures or bridgework may not be worn into surgery.  Leave suitcase in the car. After surgery it may be brought to your room.  For patients admitted to the hospital, checkout time is 11:00 AM the day of discharge.   Patients discharged the day of surgery will not be allowed to drive home.    Special Instructions: Shower using CHG night before surgery and shower the day of surgery use CHG.  Use special wash - you have one bottle of CHG for all showers.  You should use approximately 1/2 of the bottle for each shower. How to Use Chlorhexidine Before Surgery Chlorhexidine gluconate (CHG) is a germ-killing (antiseptic) solution that is used to clean the skin. It can get rid of the bacteria that normally live on the skin and can keep them away for about 24 hours. To clean your skin with CHG, you may be given: A CHG solution to use in the shower or as part of a sponge bath. A prepackaged cloth that contains CHG. Cleaning your skin with CHG may help lower the risk for infection: While you are staying in the intensive care unit of the hospital. If you have a vascular access, such as a central line, to provide short-term or long-term access to your veins. If you have a catheter to drain urine from your bladder. If you are on a ventilator. A ventilator is a  machine that helps you breathe by moving air in and out of your lungs. After surgery. What are the risks? Risks of using CHG include: A skin reaction. Hearing loss, if CHG gets in your ears and you have a perforated eardrum. Eye injury, if CHG gets in your eyes and is not rinsed out. The CHG product catching fire. Make sure that you avoid smoking and flames after applying CHG to your skin. Do not use CHG: If you have a chlorhexidine allergy or have previously reacted to chlorhexidine. On babies younger than 9 months of age. How to use CHG solution Use CHG only as told by your health care provider, and follow the instructions on the label. Use the full amount of CHG as directed. Usually, this is one bottle. During a shower Follow these steps when using CHG solution during a shower (unless your health care provider gives you different instructions): Start the shower. Use your normal soap and shampoo to wash your face and hair. Turn off the shower or move out of the shower stream. Pour the CHG onto a clean washcloth. Do not use any type of brush or rough-edged sponge. Starting at your neck, lather your body down to your toes. Make sure you follow these instructions: If you will be having surgery, pay special attention to the part of your body  where you will be having surgery. Scrub this area for at least 1 minute. Do not use CHG on your head or face. If the solution gets into your ears or eyes, rinse them well with water. Avoid your genital area. Avoid any areas of skin that have broken skin, cuts, or scrapes. Scrub your back and under your arms. Make sure to wash skin folds. Let the lather sit on your skin for 1-2 minutes or as long as told by your health care provider. Thoroughly rinse your entire body in the shower. Make sure that all body creases and crevices are rinsed well. Dry off with a clean towel. Do not put any substances on your body afterward--such as powder, lotion, or  perfume--unless you are told to do so by your health care provider. Only use lotions that are recommended by the manufacturer. Put on clean clothes or pajamas. If it is the night before your surgery, sleep in clean sheets.  During a sponge bath Follow these steps when using CHG solution during a sponge bath (unless your health care provider gives you different instructions): Use your normal soap and shampoo to wash your face and hair. Pour the CHG onto a clean washcloth. Starting at your neck, lather your body down to your toes. Make sure you follow these instructions: If you will be having surgery, pay special attention to the part of your body where you will be having surgery. Scrub this area for at least 1 minute. Do not use CHG on your head or face. If the solution gets into your ears or eyes, rinse them well with water. Avoid your genital area. Avoid any areas of skin that have broken skin, cuts, or scrapes. Scrub your back and under your arms. Make sure to wash skin folds. Let the lather sit on your skin for 1-2 minutes or as long as told by your health care provider. Using a different clean, wet washcloth, thoroughly rinse your entire body. Make sure that all body creases and crevices are rinsed well. Dry off with a clean towel. Do not put any substances on your body afterward--such as powder, lotion, or perfume--unless you are told to do so by your health care provider. Only use lotions that are recommended by the manufacturer. Put on clean clothes or pajamas. If it is the night before your surgery, sleep in clean sheets. How to use CHG prepackaged cloths Only use CHG cloths as told by your health care provider, and follow the instructions on the label. Use the CHG cloth on clean, dry skin. Do not use the CHG cloth on your head or face unless your health care provider tells you to. When washing with the CHG cloth: Avoid your genital area. Avoid any areas of skin that have broken  skin, cuts, or scrapes. Before surgery Follow these steps when using a CHG cloth to clean before surgery (unless your health care provider gives you different instructions): Using the CHG cloth, vigorously scrub the part of your body where you will be having surgery. Scrub using a back-and-forth motion for 3 minutes. The area on your body should be completely wet with CHG when you are done scrubbing. Do not rinse. Discard the cloth and let the area air-dry. Do not put any substances on the area afterward, such as powder, lotion, or perfume. Put on clean clothes or pajamas. If it is the night before your surgery, sleep in clean sheets.  For general bathing Follow these steps when using CHG cloths for  general bathing (unless your health care provider gives you different instructions). Use a separate CHG cloth for each area of your body. Make sure you wash between any folds of skin and between your fingers and toes. Wash your body in the following order, switching to a new cloth after each step: The front of your neck, shoulders, and chest. Both of your arms, under your arms, and your hands. Your stomach and groin area, avoiding the genitals. Your right leg and foot. Your left leg and foot. The back of your neck, your back, and your buttocks. Do not rinse. Discard the cloth and let the area air-dry. Do not put any substances on your body afterward--such as powder, lotion, or perfume--unless you are told to do so by your health care provider. Only use lotions that are recommended by the manufacturer. Put on clean clothes or pajamas. Contact a health care provider if: Your skin gets irritated after scrubbing. You have questions about using your solution or cloth. You swallow any chlorhexidine. Call your local poison control center (1-856-104-2573 in the [place] Get help right away if: Your eyes itch badly, or they become very red or swollen. Your skin itches badly and is red or swollen. Your  hearing changes. You have trouble seeing. You have swelling or tingling in your mouth or throat. You have trouble breathing. These symptoms may represent a serious problem that is an emergency. Do not wait to see if the symptoms will go away. Get medical help right away. Call your local emergency services (911 in the [place] Do not drive yourself to the hospital. Summary Chlorhexidine gluconate (CHG) is a germ-killing (antiseptic) solution that is used to clean the skin. Cleaning your skin with CHG may help to lower your risk for infection. You may be given CHG to use for bathing. It may be in a bottle or in a prepackaged cloth to use on your skin. Carefully follow your health care provider's instructions and the instructions on the product label. Do not use CHG if you have a chlorhexidine allergy. Contact your health care provider if your skin gets irritated after scrubbing. This information is not intended to replace advice given to you by your health care provider. Make sure you discuss any questions you have with your health care provider. Document Revised: 04/06/2022 Document Reviewed: 02/17/2021 Elsevier Patient Education  [place]. Cystoscopy Cystoscopy is a procedure that is used to help diagnose and sometimes treat conditions that affect the lower urinary tract. The lower urinary tract includes the bladder and the urethra. The urethra is the tube that drains urine from the bladder. Cystoscopy is done using a thin, tube-shaped instrument with a light and camera at the end (cystoscope). The cystoscope may be hard or flexible, depending on the goal of the procedure. The cystoscope is inserted through the urethra, into the bladder. Cystoscopy may be recommended if you have: Urinary tract infections that keep coming back. Blood in the urine (hematuria). An inability to control when you urinate (urinary incontinence) or an overactive bladder. Unusual cells found in a urine sample. A  blockage in the urethra, such as a urinary stone. Painful urination. An abnormality in the bladder found during an intravenous pyelogram (IVP) or CT scan. Cystoscopy may also be done to remove a sample of tissue to be examined under a microscope (biopsy). Tell a health care provider about: Any allergies you have. All medicines you are taking, including vitamins, herbs, eye drops, creams, and over-the-counter medicines. Any problems you  or family members have had with anesthetic medicines. Any blood disorders you have. Any surgeries you have had. Any medical conditions you have. Whether you are pregnant or may be pregnant. What are the risks? Generally, this is a safe procedure. However, problems may occur, including: Infection. Bleeding. Allergic reactions to medicines. Damage to other structures or organs. What happens before the procedure? Medicines Ask your health care provider about: Changing or stopping your regular medicines. This is especially important if you are taking diabetes medicines or blood thinners. Taking medicines such as aspirin and ibuprofen. These medicines can thin your blood. Do not take these medicines unless your health care provider tells you to take them. Taking over-the-counter medicines, vitamins, herbs, and supplements. Tests You may have an exam or testing, such as: X-rays of the bladder, urethra, or kidneys. CT scan of the abdomen or pelvis. Urine tests to check for signs of infection. General instructions Follow instructions from your health care provider about eating or drinking restrictions. Ask your health care provider what steps will be taken to help prevent infection. These steps may include: Washing skin with a germ-killing soap. Taking antibiotic medicine. Plan to have a responsible adult take you home from the hospital or clinic. What happens during the procedure?  You will be given one or more of the following: A medicine to help you  relax (sedative). A medicine to numb the area (local anesthetic). The area around the opening of your urethra will be cleaned. The cystoscope will be passed through your urethra into your bladder. Germ-free (sterile) fluid will flow through the cystoscope to fill your bladder. The fluid will stretch your bladder so that your health care provider can clearly examine your bladder walls. Your doctor will look at the urethra and bladder. Your doctor may take a biopsy or remove stones. The cystoscope will be removed, and your bladder will be emptied. The procedure may vary among health care providers and hospitals. What can I expect after the procedure? After the procedure, it is common to have: Some soreness or pain in your abdomen and urethra. Urinary symptoms. These include: Mild pain or burning when you urinate. Pain should stop within a few minutes after you urinate. This may last for up to 1 week. A small amount of blood in your urine for several days. Feeling like you need to urinate but producing only a small amount of urine. Follow these instructions at home: Medicines Take over-the-counter and prescription medicines only as told by your health care provider. If you were prescribed an antibiotic medicine, take it as told by your health care provider. Do not stop taking the antibiotic even if you start to feel better. General instructions Return to your normal activities as told by your health care provider. Ask your health care provider what activities are safe for you. If you were given a sedative during the procedure, it can affect you for several hours. Do not drive or operate machinery until your health care provider says that it is safe. Watch for any blood in your urine. If the amount of blood in your urine increases, call your health care provider. Follow instructions from your health care provider about eating or drinking restrictions. If a tissue sample was removed for testing  (biopsy) during your procedure, it is up to you to get your test results. Ask your health care provider, or the department that is doing the test, when your results will be ready. Drink enough fluid to keep your urine pale  yellow. Keep all follow-up visits. This is important. Contact a health care provider if: You have pain that gets worse or does not get better with medicine, especially pain when you urinate. You have trouble urinating. You have more blood in your urine. Get help right away if: You have blood clots in your urine. You have abdominal pain. You have a fever or chills. You are unable to urinate. Summary Cystoscopy is a procedure that is used to help diagnose and sometimes treat conditions that affect the lower urinary tract. Cystoscopy is done using a thin, tube-shaped instrument with a light and camera at the end. After the procedure, it is common to have some soreness or pain in your abdomen and urethra. Watch for any blood in your urine. If the amount of blood in your urine increases, call your health care provider. If you were prescribed an antibiotic medicine, take it as told by your health care provider. Do not stop taking the antibiotic even if you start to feel better. This information is not intended to replace advice given to you by your health care provider. Make sure you discuss any questions you have with your health care provider. Document Revised: 08/20/2021 Document Reviewed: 07/19/2020 Elsevier Patient Education  [place] Anesthesia, Adult, Care After The following information offers guidance on how to care for yourself after your procedure. Your health care provider may also give you more specific instructions. If you have problems or questions, contact your health care provider. What can I expect after the procedure? After the procedure, it is common for people to: Have pain or discomfort at the IV site. Have nausea or vomiting. Have a  sore throat or hoarseness. Have trouble concentrating. Feel cold or chills. Feel weak, sleepy, or tired (fatigue). Have soreness and body aches. These can affect parts of the body that were not involved in surgery. Follow these instructions at home: For the time period you were told by your health care provider:  Rest. Do not participate in activities where you could fall or become injured. Do not drive or use machinery. Do not drink alcohol. Do not take sleeping pills or medicines that cause drowsiness. Do not make important decisions or sign legal documents. Do not take care of children on your own. General instructions Drink enough fluid to keep your urine pale yellow. If you have sleep apnea, surgery and certain medicines can increase your risk for breathing problems. Follow instructions from your health care provider about wearing your sleep device: Anytime you are sleeping, including during daytime naps. While taking prescription pain medicines, sleeping medicines, or medicines that make you drowsy. Return to your normal activities as told by your health care provider. Ask your health care provider what activities are safe for you. Take over-the-counter and prescription medicines only as told by your health care provider. Do not use any products that contain nicotine or tobacco. These products include cigarettes, chewing tobacco, and vaping devices, such as e-cigarettes. These can delay incision healing after surgery. If you need help quitting, ask your health care provider. Contact a health care provider if: You have nausea or vomiting that does not get better with medicine. You vomit every time you eat or drink. You have pain that does not get better with medicine. You cannot urinate or have bloody urine. You develop a skin rash. You have a fever. Get help right away if: You have trouble breathing. You have chest pain. You vomit blood. These symptoms may be an  emergency. Get  help right away. Call 911. Do not wait to see if the symptoms will go away. Do not drive yourself to the hospital. Summary After the procedure, it is common to have a sore throat, hoarseness, nausea, vomiting, or to feel weak, sleepy, or fatigue. For the time period you were told by your health care provider, do not drive or use machinery. Get help right away if you have difficulty breathing, have chest pain, or vomit blood. These symptoms may be an emergency. This information is not intended to replace advice given to you by your health care provider. Make sure you discuss any questions you have with your health care provider. Document Revised: 03/06/2022 Document Reviewed: 03/06/2022 Elsevier Patient Education  [place].

## 2023-03-02 ENCOUNTER — Encounter (HOSPITAL_COMMUNITY)
Admission: RE | Admit: 2023-03-02 | Discharge: 2023-03-02 | Disposition: A | Discharge status: Home / Self Care | Payer: Medicare Other | Source: Ambulatory Visit | Attending: Urology | Admitting: Urology

## 2023-03-02 VITALS — BP 126/60 | HR 66 | Temp 97.9°F | Resp 18 | Ht 68.0 in | Wt 249.1 lb

## 2023-03-02 DIAGNOSIS — Z01818 Encounter for other preprocedural examination: Secondary | ICD-10-CM | POA: Insufficient documentation

## 2023-03-02 DIAGNOSIS — E119 Type 2 diabetes mellitus without complications: Secondary | ICD-10-CM | POA: Insufficient documentation

## 2023-03-02 DIAGNOSIS — I4891 Unspecified atrial fibrillation: Secondary | ICD-10-CM | POA: Diagnosis not present

## 2023-03-02 DIAGNOSIS — I10 Essential (primary) hypertension: Secondary | ICD-10-CM | POA: Diagnosis not present

## 2023-03-02 LAB — BASIC METABOLIC PANEL
Anion gap: 10 (ref 5–15)
BUN: 21 mg/dL (ref 8–23)
CO2: 23 mmol/L (ref 22–32)
Calcium: 9.2 mg/dL (ref 8.9–10.3)
Chloride: 102 mmol/L (ref 98–111)
Creatinine, Ser: 0.91 mg/dL (ref 0.61–1.24)
GFR, Estimated: >60 mL/min (ref >60)
Glucose, Bld: 187 mg/dL — ABNORMAL HIGH (ref 70–99)
Potassium: 4 mmol/L (ref 3.5–5.1)
Sodium: 135 mmol/L (ref 135–145)

## 2023-03-02 NOTE — Progress Notes (Signed)
Patient did not stop his Ozempic for 7 days and will need to be cancelled and rescheduled.

## 2023-03-08 ENCOUNTER — Telehealth: Payer: Self-pay

## 2023-03-08 NOTE — Telephone Encounter (Signed)
I spoke with Marc Burns. We have discussed possible surgery dates and 04/15/2023 was agreed upon by all parties. Patient given information about surgery date, what to expect pre-operatively and post operatively. Patient rescheduled from previous surgery date. Patient instructed to hold Ozempic 7 days prior and voiced understanding    We discussed that a pre-op nurse will be calling to set up the pre-op visit that will take place prior to surgery. Informed patient that our office will communicate any additional care to be provided after surgery.    Patients questions or concerns were discussed during our call. Advised to call our office should there be any additional information, questions or concerns that arise. Patient verbalized understanding.

## 2023-03-31 ENCOUNTER — Ambulatory Visit: Payer: Medicare Other | Admitting: Urology

## 2023-04-02 ENCOUNTER — Ambulatory Visit: Payer: Medicare Other | Admitting: Urology

## 2023-04-12 ENCOUNTER — Encounter (HOSPITAL_COMMUNITY)
Admission: RE | Admit: 2023-04-12 | Discharge: 2023-04-12 | Disposition: A | Discharge status: Home / Self Care | Payer: Medicare Other | Source: Ambulatory Visit | Attending: Urology | Admitting: Urology

## 2023-04-12 ENCOUNTER — Encounter (HOSPITAL_COMMUNITY): Payer: Self-pay

## 2023-04-15 ENCOUNTER — Encounter (HOSPITAL_COMMUNITY)
Admission: RE | Disposition: A | Discharge status: Home / Self Care | Payer: Self-pay | Source: Home / Self Care | Attending: Urology

## 2023-04-15 ENCOUNTER — Ambulatory Visit (HOSPITAL_BASED_OUTPATIENT_CLINIC_OR_DEPARTMENT_OTHER): Payer: Medicare Other | Admitting: Anesthesiology

## 2023-04-15 ENCOUNTER — Encounter (HOSPITAL_COMMUNITY): Payer: Self-pay | Admitting: Urology

## 2023-04-15 ENCOUNTER — Ambulatory Visit (HOSPITAL_COMMUNITY): Payer: Medicare Other | Admitting: Anesthesiology

## 2023-04-15 ENCOUNTER — Ambulatory Visit (HOSPITAL_COMMUNITY)
Admission: RE | Admit: 2023-04-15 | Discharge: 2023-04-15 | Disposition: A | Discharge status: Home / Self Care | Payer: Medicare Other | Attending: Urology | Admitting: Urology

## 2023-04-15 DIAGNOSIS — Z7984 Long term (current) use of oral hypoglycemic drugs: Secondary | ICD-10-CM | POA: Diagnosis not present

## 2023-04-15 DIAGNOSIS — K219 Gastro-esophageal reflux disease without esophagitis: Secondary | ICD-10-CM | POA: Diagnosis not present

## 2023-04-15 DIAGNOSIS — Z7985 Long-term (current) use of injectable non-insulin antidiabetic drugs: Secondary | ICD-10-CM | POA: Insufficient documentation

## 2023-04-15 DIAGNOSIS — I10 Essential (primary) hypertension: Secondary | ICD-10-CM | POA: Insufficient documentation

## 2023-04-15 DIAGNOSIS — N319 Neuromuscular dysfunction of bladder, unspecified: Secondary | ICD-10-CM | POA: Diagnosis not present

## 2023-04-15 DIAGNOSIS — E119 Type 2 diabetes mellitus without complications: Secondary | ICD-10-CM

## 2023-04-15 DIAGNOSIS — N3941 Urge incontinence: Secondary | ICD-10-CM | POA: Insufficient documentation

## 2023-04-15 HISTORY — PX: BOTOX INJECTION: SHX5754

## 2023-04-15 HISTORY — PX: CYSTOSCOPY: SHX5120

## 2023-04-15 LAB — GLUCOSE, CAPILLARY
Glucose-Capillary: 175 mg/dL — ABNORMAL HIGH (ref 70–99)
Glucose-Capillary: 160 mg/dL — ABNORMAL HIGH (ref 70–99)

## 2023-04-15 SURGERY — CYSTOSCOPY
Anesthesia: General | Site: Penis

## 2023-04-15 MED ORDER — ONABOTULINUMTOXINA 100 UNITS IJ SOLR
INTRAMUSCULAR | Status: DC | PRN
  Administered 2023-04-15 (×2): 100 [IU] via INTRAMUSCULAR

## 2023-04-15 MED ORDER — EPHEDRINE SULFATE (PRESSORS) 50 MG/ML IJ SOLN
INTRAMUSCULAR | Status: DC | PRN
  Administered 2023-04-15 (×2): 5 mg via INTRAVENOUS

## 2023-04-15 MED ORDER — LACTATED RINGERS IV SOLN: INTRAVENOUS | Status: DC

## 2023-04-15 MED ORDER — GLYCOPYRROLATE 0.2 MG/ML IJ SOLN
INTRAMUSCULAR | Status: DC | PRN
  Administered 2023-04-15: .2 mg via INTRAVENOUS

## 2023-04-15 MED ORDER — DEXMEDETOMIDINE HCL IN NACL 80 MCG/20ML IV SOLN
INTRAVENOUS | Status: DC | PRN
  Administered 2023-04-15: 4 ug via INTRAVENOUS
  Administered 2023-04-15: 8 ug via INTRAVENOUS

## 2023-04-15 MED ORDER — CEFAZOLIN SODIUM-DEXTROSE 2-4 GM/100ML-% IV SOLN
2.0000 g | Freq: Once | INTRAVENOUS | Status: AC
  Administered 2023-04-15: 2 g via INTRAVENOUS

## 2023-04-15 MED ORDER — EPHEDRINE 5 MG/ML INJ
INTRAVENOUS | Status: AC
  Filled 2023-04-15: qty 5

## 2023-04-15 MED ORDER — ONDANSETRON HCL 4 MG/2ML IJ SOLN
INTRAMUSCULAR | Status: AC
  Filled 2023-04-15: qty 2

## 2023-04-15 MED ORDER — PROPOFOL 10 MG/ML IV BOLUS
INTRAVENOUS | Status: AC
  Filled 2023-04-15: qty 20

## 2023-04-15 MED ORDER — GLYCOPYRROLATE PF 0.2 MG/ML IJ SOSY
PREFILLED_SYRINGE | INTRAMUSCULAR | Status: AC
  Filled 2023-04-15: qty 1

## 2023-04-15 MED ORDER — PROPOFOL 10 MG/ML IV BOLUS
INTRAVENOUS | Status: DC | PRN
  Administered 2023-04-15: 50 mg via INTRAVENOUS
  Administered 2023-04-15: 150 mg via INTRAVENOUS
  Administered 2023-04-15 (×2): 50 mg via INTRAVENOUS

## 2023-04-15 MED ORDER — ONDANSETRON HCL 4 MG/2ML IJ SOLN: 4.0000 mg | Freq: Once | INTRAMUSCULAR | Status: DC | PRN

## 2023-04-15 MED ORDER — SODIUM CHLORIDE (PF) 0.9 % IJ SOLN
INTRAMUSCULAR | Status: AC
  Filled 2023-04-15: qty 20

## 2023-04-15 MED ORDER — ONDANSETRON HCL 4 MG/2ML IJ SOLN
INTRAMUSCULAR | Status: DC | PRN
  Administered 2023-04-15: 4 mg via INTRAVENOUS

## 2023-04-15 MED ORDER — LIDOCAINE HCL (PF) 2 % IJ SOLN
INTRAMUSCULAR | Status: AC
  Filled 2023-04-15: qty 5

## 2023-04-15 MED ORDER — ORAL CARE MOUTH RINSE: 15.0000 mL | Freq: Once | OROMUCOSAL | Status: AC

## 2023-04-15 MED ORDER — LIDOCAINE HCL (CARDIAC) PF 100 MG/5ML IV SOSY
PREFILLED_SYRINGE | INTRAVENOUS | Status: DC | PRN
  Administered 2023-04-15: 50 mg via INTRATRACHEAL

## 2023-04-15 MED ORDER — HYDROMORPHONE HCL 1 MG/ML IJ SOLN: 0.2500 mg | INTRAMUSCULAR | Status: DC | PRN

## 2023-04-15 MED ORDER — LACTATED RINGERS IV SOLN: INTRAVENOUS | Status: DC | PRN

## 2023-04-15 MED ORDER — CHLORHEXIDINE GLUCONATE 0.12 % MT SOLN
15.0000 mL | Freq: Once | OROMUCOSAL | Status: AC
  Administered 2023-04-15: 15 mL via OROMUCOSAL

## 2023-04-15 MED ORDER — CEFAZOLIN SODIUM-DEXTROSE 2-4 GM/100ML-% IV SOLN
INTRAVENOUS | Status: AC
  Filled 2023-04-15: qty 100

## 2023-04-15 MED ORDER — STERILE WATER FOR IRRIGATION IR SOLN
Status: DC | PRN
  Administered 2023-04-15: 3000 mL
  Administered 2023-04-15: 500 mL

## 2023-04-15 MED ORDER — ONABOTULINUMTOXINA 100 UNITS IJ SOLR: 200.0000 [IU] | Freq: Once | INTRAMUSCULAR | Status: DC

## 2023-04-15 MED ORDER — ONABOTULINUMTOXINA 100 UNITS IJ SOLR
INTRAMUSCULAR | Status: AC
  Filled 2023-04-15: qty 100

## 2023-04-15 SURGICAL SUPPLY — 17 items
BAG DRAIN URO TABLE W/ADPT NS (BAG) ×1 IMPLANT
BAG DRN 8 ADPR NS SKTRN CSTL (BAG) ×1
BAG HAMPER (MISCELLANEOUS) ×1 IMPLANT
CATH FOLEY 2WAY SLVR  5CC 18FR (CATHETERS) ×1
CATH FOLEY 2WAY SLVR 30CC 20FR (CATHETERS) IMPLANT
CATH FOLEY 2WAY SLVR 5CC 18FR (CATHETERS) IMPLANT
CLOTH BEACON ORANGE TIMEOUT ST (SAFETY) ×1 IMPLANT
GLOVE BIO SURGEON STRL SZ8 (GLOVE) ×1 IMPLANT
GLOVE BIOGEL PI IND STRL 7.0 (GLOVE) ×2 IMPLANT
GOWN STRL REUS W/TWL LRG LVL3 (GOWN DISPOSABLE) ×2 IMPLANT
GOWN STRL REUS W/TWL XL LVL3 (GOWN DISPOSABLE) ×1 IMPLANT
KIT TURNOVER CYSTO (KITS) ×1 IMPLANT
MANIFOLD NEPTUNE II (INSTRUMENTS) ×1 IMPLANT
PACK CYSTO (CUSTOM PROCEDURE TRAY) ×1 IMPLANT
PAD ARMBOARD 7.5X6 YLW CONV (MISCELLANEOUS) ×1 IMPLANT
TOWEL NATURAL 4PK STERILE (DISPOSABLE) ×1 IMPLANT
WATER STERILE IRR 500ML POUR (IV SOLUTION) ×1 IMPLANT

## 2023-04-15 NOTE — Anesthesia Preprocedure Evaluation (Signed)
Anesthesia Evaluation  Patient identified by MRN, date of birth, ID band Patient awake    Reviewed: Allergy & Precautions, H&P , NPO status , Patient's Chart, lab work & pertinent test results  History of Anesthesia Complications (+) DIFFICULT AIRWAY and history of anesthetic complications  Airway Mallampati: II  TM Distance: >3 FB Neck ROM: Full    Dental  (+) Dental Advisory Given, Missing   Pulmonary neg pulmonary ROS   Pulmonary exam normal breath sounds clear to auscultation       Cardiovascular Exercise Tolerance: Good hypertension, Pt. on medications  Rhythm:Regular Rate:Normal + Systolic murmurs    Neuro/Psych negative neurological ROS  negative psych ROS   GI/Hepatic Neg liver ROS,GERD  Medicated and Controlled,,  Endo/Other  diabetes (ozempic - 04/04/23), Well Controlled, Type 2, Oral Hypoglycemic Agents    Renal/GU negative Renal ROS  negative genitourinary   Musculoskeletal  (+) Arthritis , Osteoarthritis,    Abdominal   Peds negative pediatric ROS (+)  Hematology negative hematology ROS (+)   Anesthesia Other Findings   Reproductive/Obstetrics negative OB ROS                             Anesthesia Physical Anesthesia Plan  ASA: 2  Anesthesia Plan: General   Post-op Pain Management: Dilaudid IV   Induction: Intravenous  PONV Risk Score and Plan: 3 and Ondansetron and Dexamethasone  Airway Management Planned: LMA  Additional Equipment:   Intra-op Plan:   Post-operative Plan: Extubation in OR  Informed Consent: I have reviewed the patients History and Physical, chart, labs and discussed the procedure including the risks, benefits and alternatives for the proposed anesthesia with the patient or authorized representative who has indicated his/her understanding and acceptance.       Plan Discussed with: CRNA and Surgeon  Anesthesia Plan Comments:          Anesthesia Quick Evaluation

## 2023-04-15 NOTE — Transfer of Care (Signed)
Immediate Anesthesia Transfer of Care Note  Patient: Marc Burns  Procedure(s) Performed: CYSTOSCOPY WITH FULGURATION OF BLEEDING (Penis) BOTOX INJECTION (Penis)  Patient Location: PACU  Anesthesia Type:General  Level of Consciousness: drowsy and patient cooperative  Airway & Oxygen Therapy: Patient Spontanous Breathing and Patient connected to nasal cannula oxygen  Post-op Assessment: Report given to RN and Post -op Vital signs reviewed and stable  Post vital signs: Reviewed and stable  Last Vitals:  Vitals Value Taken Time  BP 129/69 04/15/23 0953  Temp    Pulse 82 04/15/23 0955  Resp 17 04/15/23 0955  SpO2 96 % 04/15/23 0955  Vitals shown include unvalidated device data.  Last Pain:  Vitals:   04/15/23 0817  PainSc: 0-No pain         Complications: No notable events documented.

## 2023-04-15 NOTE — Anesthesia Postprocedure Evaluation (Signed)
Anesthesia Post Note  Patient: Marc Burns  Procedure(s) Performed: CYSTOSCOPY WITH FULGURATION OF BLEEDING (Penis) BOTOX INJECTION (Penis)  Patient location during evaluation: Phase II Anesthesia Type: General Level of consciousness: awake and alert and oriented Pain management: pain level controlled Vital Signs Assessment: post-procedure vital signs reviewed and stable Respiratory status: spontaneous breathing, nonlabored ventilation and respiratory function stable Cardiovascular status: blood pressure returned to baseline and stable Postop Assessment: no apparent nausea or vomiting Anesthetic complications: no  No notable events documented.   Last Vitals:  Vitals:   04/15/23 1015 04/15/23 1028  BP: 116/61 132/64  Pulse: 73 69  Resp: 20 18  Temp:  (!) 36.3 C  SpO2: 97% 98%    Last Pain:  Vitals:   04/15/23 1028  TempSrc: Oral  PainSc: 6                  Paidyn Mcferran C Charma Mocarski

## 2023-04-15 NOTE — Anesthesia Procedure Notes (Signed)
Procedure Name: LMA Insertion Date/Time: 04/15/2023 9:18 AM  Performed by: Chelsea Aus, CRNAPre-anesthesia Checklist: Patient identified, Emergency Drugs available, Suction available and Patient being monitored Patient Re-evaluated:Patient Re-evaluated prior to induction Oxygen Delivery Method: Circle system utilized Preoxygenation: Pre-oxygenation with 100% oxygen Induction Type: IV induction LMA: LMA inserted Tube type: Oral Number of attempts: 1 Placement Confirmation: positive ETCO2 and breath sounds checked- equal and bilateral Tube secured with: Tape Dental Injury: Teeth and Oropharynx as per pre-operative assessment

## 2023-04-15 NOTE — Op Note (Signed)
Preoperative diagnosis: neurogenic bladder  Postoperative diagnosis: same  Procedure: 1 cystoscopy 2. Intravesical botox injection 200 units  Attending: Wilkie Aye  Anesthesia: General  Estimated blood loss: Minimal  Drains: 18 french foley  Specimens: none  Antibiotics: ancef  Findings:  Ureteral orifices in normal anatomic location.  No masses/lesions in the bladder  Indications: Patient is a 77 year old male with a history of neurogenic bladder and urinary leakage refractory to anticholinergic therapy.  After discussing treatment options, they decided proceed with intravesical botox injection.  Procedure in detail: The patient was brought to the operating room and a brief timeout was done to ensure correct patient, correct procedure, correct site.  General anesthesia was administered patient was placed in dorsal lithotomy position.  Their genitalia was then prepped and draped in usual sterile fashion.  A rigid 22 French cystoscope was passed in the urethra and the bladder.  Bladder was inspected and we noted no masses or lesions.  the ureteral orifices were in the normal orthotopic locations. Using a deflux injection needle we proceed to inject 200 units in a grid pattern between the ureteral orifices starting at the trigone to the mid posterior wall. We injected a total of 20 sites with 10 units per injection. The bladder was then drained and this concluded the procedure which was well tolerated by patient.  Complications: None  Condition: Stable, extubated, transferred to PACU  Plan: Patient is to be discharge home. They will followup in 1 month

## 2023-04-15 NOTE — H&P (Signed)
HPI: [name] is a 77yo here for followup for neurogenic bladder and urge incontinence. Over the past 2 months he has noted increased bladder spasms and he has to flush his foley catheter more often. He last had intravesical botox in 07/2022.      PMH:     Past Medical History:  Diagnosis Date   Arthritis     Bladder neck contracture     Complication of anesthesia      Difficult intubation   Diabetes mellitus without complication (HCC)     Difficult intubation     Foley catheter in place     GERD (gastroesophageal reflux disease)     History of gastric ulcer      2007   History of kidney stones     History of prostate cancer      2004--  S/P PROSTATECTOMY   Hypertension     Urinary incontinence     Wears glasses        Surgical History:      Past Surgical History:  Procedure Laterality Date   BALLOON DILATION N/A 09/12/2013    Procedure: CYSTO BALLOON DILATION/RETROGRADE URETHROGRAM;  Surgeon: Martina Sinner, MD;  Location: [facility];  Service: Urology;  Laterality: N/A;   BOTOX INJECTION N/A 01/08/2022    Procedure: BOTOX INJECTION;  Surgeon: Malen Gauze, MD;  Location: [facility];  Service: Urology;  Laterality: N/A;   BOTOX INJECTION N/A 08/13/2022    Procedure: BOTOX INJECTION;  Surgeon: Malen Gauze, MD;  Location: [facility];  Service: Urology;  Laterality: N/A;   CYSTO/  URETHRAL DILATION/ BLADDER BX/ BILATERAL RETROGRADE PYELOGRAM   03-03-2010   CYSTO/ BALLOON DILATION AND VAPORIZATION BLADDER NECK & PROSTATIC FOSSA   04-28-2010   CYSTO/ BALLOON DILATION BLADDER NECK CONTRACTURE   07-23-2011;   11-17-2010;  08-08-2010   CYSTOSCOPY N/A 01/08/2022    Procedure: CYSTOSCOPY WITH INTRAVESICAL BOTOX 100 UNITS;  Surgeon: Malen Gauze, MD;  Location: [facility];  Service: Urology;  Laterality: N/A;   CYSTOSCOPY WITH DIRECT VISION INTERNAL URETHROTOMY N/A 04/24/2021    Procedure: CYSTOSCOPY WITH DIRECT VISION INTERNAL URETHROTOMY;  Surgeon: Malen Gauze, MD;  Location: [facility];  Service: Urology;  Laterality: N/A;   CYSTOSCOPY WITH INJECTION N/A 08/13/2022    Procedure: CYSTOSCOPY WITH INJECTION;  Surgeon: Malen Gauze, MD;  Location: [facility];  Service: Urology;  Laterality: N/A;   FULGURATION OF LESION   04/24/2021    Procedure: FULGURATION OF BLADDER NECK;  Surgeon: Malen Gauze, MD;  Location: [facility];  Service: Urology;;   MITOMYCIN C INJECTION   04/24/2021    Procedure: MITOMYCIN C INJECTION ANTERIOR BLADDER WALL;  Surgeon: Malen Gauze, MD;  Location: [facility];  Service: Urology;;   PROSTATECTOMY   2004    RADICAL   TONSILLECTOMY   AS CHILD   TRANSURETHRAL INCISION AND RESECTION OF BLADDER NECK CONTRACTURE   09-01-2010   UMBILICAL HERNIA REPAIR          Home Medications:  Allergies as of 02/01/2023         Reactions    Sulfa Antibiotics Itching, Rash            Medication List           Accurate as of February 01, 2023 11:16 AM. If you have any questions, ask your nurse or doctor.              amLODipine-benazepril 5-20 MG  capsule Commonly known as: LOTREL Take 1 capsule by mouth every morning.    aspirin EC 81 MG tablet Take 81 mg by mouth daily. Swallow whole.    CENTRUM SILVER ADULT 50+ PO Take 1 tablet by mouth daily.    ciprofloxacin 250 MG tablet Commonly known as: Cipro Take 1 tablet (250 mg total) by mouth 2 (two) times daily. What changed:  when to take this additional instructions    clotrimazole-betamethasone cream Commonly known as: Lotrisone Apply 1 application. topically 2 (two) times daily as needed (itching).    diazepam 5 MG tablet Commonly known as: VALIUM Take 1 tablet (5 mg total) by mouth every 12 (twelve) hours as needed for muscle spasms.    empagliflozin 25 MG Tabs tablet Commonly known as: JARDIANCE Take 25 mg by mouth daily.    furosemide 20 MG tablet Commonly known as: LASIX Take 20 mg by mouth daily.    gabapentin 300 MG capsule Commonly known  as: NEURONTIN Take 300-600 mg by mouth See admin instructions. 600 mg in the morning, 300 mg midday, 600 mg at bedtime    Gemtesa 75 MG Tabs Generic drug: Vibegron Take 75 mg by mouth daily.    ibuprofen 200 MG tablet Commonly known as: ADVIL Take 400 mg by mouth every 6 (six) hours as needed for moderate pain or headache.    lidocaine 5 % Commonly known as: Lidoderm Place 1 patch onto the skin daily. Remove & Discard patch within 12 hours or as directed by MD    metoprolol tartrate 25 MG tablet Commonly known as: LOPRESSOR Take 25 mg by mouth 2 (two) times daily.    mupirocin ointment 2 % Commonly known as: BACTROBAN Apply 1 application topically 2 (two) times daily as needed for wound care.    omeprazole 20 MG capsule Commonly known as: PRILOSEC Take 20 mg by mouth daily.    oxybutynin 10 MG 24 hr tablet Commonly known as: DITROPAN-XL 10 mg daily.    Ozempic (0.25 or 0.5 MG/DOSE) 2 MG/1.5ML Sopn Generic drug: Semaglutide(0.25 or 0.5MG /DOS) Inject 0.5 mg into the skin every Sunday.    primidone 50 MG tablet Commonly known as: MYSOLINE Take 1 tablet (50 mg total) by mouth 2 (two) times daily.    rosuvastatin 10 MG tablet Commonly known as: CRESTOR Take 10 mg by mouth at bedtime.             Allergies:      Allergies  Allergen Reactions   Sulfa Antibiotics Itching and Rash      Family History:      Family History  Problem Relation Age of Onset   Heart attack Mother     Heart attack Father        Social History:  reports that he has never smoked. He has never used smokeless tobacco. He reports current alcohol use of about 6.0 standard drinks of alcohol per week. He reports that he does not use drugs.   ROS: All other review of systems were reviewed and are negative except what is noted above in HPI   Physical Exam: BP 129/70   Pulse 64   Constitutional:  Alert and oriented, No acute distress. HEENT:  AT, moist mucus membranes.  Trachea midline,  no masses. Cardiovascular: No clubbing, cyanosis, or edema. Respiratory: Normal respiratory effort, no increased work of breathing. GI: Abdomen is soft, nontender, nondistended, no abdominal masses GU: No CVA tenderness.  Lymph: No cervical or inguinal lymphadenopathy. Skin: No rashes, bruises  or suspicious lesions. Neurologic: Grossly intact, no focal deficits, moving all 4 extremities. Psychiatric: Normal mood and affect.   Laboratory Data: Recent Labs       Lab Results  Component Value Date    WBC 14.0 (H) 09/09/2022    HGB 14.7 09/09/2022    HCT 45.6 09/09/2022    MCV 92.1 09/09/2022    PLT 33 (L) 09/09/2022        Recent Labs       Lab Results  Component Value Date    CREATININE 1.01 09/09/2022        Recent Labs  No results found for: "PSA"     Recent Labs  No results found for: "TESTOSTERONE"     Recent Labs       Lab Results  Component Value Date    HGBA1C 7.1 (H) 08/11/2022        Urinalysis Labs (Brief)          Component Value Date/Time    COLORURINE YELLOW 02/26/2010 0930    APPEARANCEUR CLOUDY (A) 02/26/2010 0930    LABSPEC 1.019 02/26/2010 0930    PHURINE 5.5 02/26/2010 0930    GLUCOSEU NEGATIVE 02/26/2010 0930    HGBUR LARGE (A) 02/26/2010 0930    BILIRUBINUR NEGATIVE 02/26/2010 0930    KETONESUR NEGATIVE 02/26/2010 0930    PROTEINUR 30 (A) 02/26/2010 0930    UROBILINOGEN 0.2 02/26/2010 0930    NITRITE NEGATIVE 02/26/2010 0930    LEUKOCYTESUR SMALL (A) 02/26/2010 0930        Recent Labs       Lab Results  Component Value Date    BACTERIA FEW (A) 02/26/2010        Pertinent Imaging:   No results found for this or any previous visit.   No results found for this or any previous visit.   No results found for this or any previous visit.   No results found for this or any previous visit.   Results for orders placed during the hospital encounter of 07/20/17   US Renal   Narrative CLINICAL DATA:  77 year old male with  urinary retention. Self catheterization. Initial encounter.   EXAM: RENAL / URINARY TRACT ULTRASOUND COMPLETE   COMPARISON:  None.   FINDINGS: Right Kidney:   Length: 11.3 cm. Echogenicity within normal limits. No mass or hydronephrosis visualized.   Left Kidney:   Length: 11.8 cm. Echogenicity within normal limits. No mass or hydronephrosis visualized.   Bladder:   Decompressed and not adequately assessed   IMPRESSION: Kidneys unremarkable   Bladder decompressed and not assessed     Electronically Signed By: Lacy Duverney M.D. On: 07/20/2017 12:25   No valid procedures specified. No results found for this or any previous visit.   No results found for this or any previous visit.     Assessment & Plan:     1. Neurogenic bladder We will proceed with intravesical botox 200 units. Risks/benefits/alternatives discussed - Urinalysis, Routine w reflex microscopic         No follow-ups on file.   Wilkie Aye, MD   [facility] [place]

## 2023-04-20 ENCOUNTER — Encounter (HOSPITAL_COMMUNITY): Payer: Self-pay | Admitting: Urology

## 2023-04-26 NOTE — Patient Instructions (Signed)
Below is our plan:  We will primidone 50mg  twice daily.   Please make sure you are staying well hydrated. I recommend 50-60 ounces daily. Well balanced diet and regular exercise encouraged. Consistent sleep schedule with 6-8 hours recommended.   Please continue follow up with care team as directed.   Follow up with me in 1 year   You may receive a survey regarding today's visit. I encourage you to leave honest feed back as I do use this information to improve patient care. Thank you for seeing me today!

## 2023-04-26 NOTE — Progress Notes (Unsigned)
No chief complaint on file.     HISTORY OF PRESENT ILLNESS:  04/26/23 ALL:  Marc Burns returns for follow up for essential tremor. He continues primidone 50mg  BID.   05/25/2022 ALL: Marc Burns returns for follow up for essential tremor. He was seen last 11/2021 and primidone was increased to 25-50mg  in am and 50mg  at bedtime. Since, he reports doing well. He is tolerating primidone without obvious adverse effects. Tremor waxes and wanes but he feels it is fairly mild and stable on current dose of primidone. Labs are followed by PCP.   11/20/2021 ALL:  Marc Burns is a 77 y.o. adult here today for follow up for bilateral hand tremor. He seen in consult with Dr Frances Furbish 07/2021. Tremor felt to be more consistent with an essential tremor and he was started on primidone 50mg  at bedtime. He has tolerated medication well. No obvious adverse effects. He is not certain tremor has improved but does not feel it is worse. He has difficulty hold a plate or cup. It is worse when anxious or tired. No resting tremor noted. He is feeling well today and without concerns.    HISTORY (copied from Dr Teofilo Pod previous note)  Dear Dr. Mayford Knife,   I saw your patient, Marc Burns, upon your kind request in my neurologic clinic today for initial consultation of his hand tremor.  The patient is accompanied by his daughter, Darl Pikes, today.  As you know, Mr. Waldock is a 77 year old right-handed gentleman with an underlying medical history of hypertension, diabetes, hyperlipidemia, pressure sore, reflux disease, history of gastric ulcer, history of kidney stones, prostate cancer, and obesity, who reports an approximately 1+ year history of bilateral hand tremors.  He has noted the tremor primarily when he tries to hold a cup or feed himself.  Tremor is bilateral and mostly with holding and doing something with fine motor skills involved, not at rest.  He has not noticed any tremor in the upper body or legs.  He is not aware of any  family history of tremors.  He is an only child.  Parents lived to be in their mid to late 36s and both had heart problems.  He has 3 grown children, none with tremors.  There is no obvious trigger or alleviating factor.  He has not had any recent medication changes.  Some days the tremor is hardly noticeable and some other days it is quite significant and bothersome.  He has been worried about having something like Parkinson's disease.  He limits his caffeine to 2 cups of coffee in the morning but does drink some soda 1 or 2 small bottles per day on average.  He tries to hydrate well.  He has tried to exercise his upper body with small weights, up to 5 pounds but did not notice any improvement in his tremor.  He denies sudden onset of one-sided weakness or numbness or tingling or droopy face or slurring of speech, denies any recurrent headaches. I reviewed your office note from 04/29/2021.  He has been on metoprolol.  He has been on gabapentin for diabetic neuropathy and is currently on 200 mg twice daily.  His last A1c in February 2022 was 7.4.   REVIEW OF SYSTEMS: Out of a complete 14 system review of symptoms, the patient complains only of the following symptoms, tremor and all other reviewed systems are negative.   ALLERGIES: Allergies  Allergen Reactions   Sulfa Antibiotics Itching and Rash     HOME MEDICATIONS:  Outpatient Medications Prior to Visit  Medication Sig Dispense Refill   amLODipine-benazepril (LOTREL) 5-20 MG per capsule Take 1 capsule by mouth every morning.     aspirin EC 81 MG tablet Take 81 mg by mouth daily. Swallow whole.     Cholecalciferol (VITAMIN D3) 50 MCG (2000 UT) CAPS Take 2,000 Units by mouth 2 (two) times daily.     ciprofloxacin (CIPRO) 250 MG tablet Take 1 tablet (250 mg total) by mouth 2 (two) times daily. (Patient taking differently: Take 250 mg by mouth See admin instructions. Take twice daily as needed for 5 days for UTI) 10 tablet 7    clotrimazole-betamethasone (LOTRISONE) cream Apply 1 application. topically 2 (two) times daily as needed (itching). (Patient not taking: Reported on 03/01/2023) 30 g 3   diazepam (VALIUM) 5 MG tablet Take 1 tablet (5 mg total) by mouth every 12 (twelve) hours as needed for muscle spasms. (Patient not taking: Reported on 03/01/2023) 8 tablet 0   empagliflozin (JARDIANCE) 25 MG TABS tablet Take 25 mg by mouth daily.     furosemide (LASIX) 20 MG tablet Take 20 mg by mouth daily.     gabapentin (NEURONTIN) 600 MG tablet Take 600 mg by mouth 3 (three) times daily.     ibuprofen (ADVIL) 200 MG tablet Take 400 mg by mouth every 6 (six) hours as needed for moderate pain or headache.     lidocaine (LIDODERM) 5 % Place 1 patch onto the skin daily. Remove & Discard patch within 12 hours or as directed by MD (Patient not taking: Reported on 03/01/2023) 5 patch 0   metoprolol tartrate (LOPRESSOR) 25 MG tablet Take 25 mg by mouth 2 (two) times daily.     Multiple Vitamins-Minerals (CENTRUM SILVER ADULT 50+ PO) Take 1 tablet by mouth daily.     omeprazole (PRILOSEC) 20 MG capsule Take 20 mg by mouth daily.     OZEMPIC, 0.25 OR 0.5 MG/DOSE, 2 MG/1.5ML SOPN Inject 0.5 mg into the skin every Sunday.     primidone (MYSOLINE) 50 MG tablet Take 1 tablet (50 mg total) by mouth 2 (two) times daily. 180 tablet 3   rosuvastatin (CRESTOR) 10 MG tablet Take 10 mg by mouth at bedtime.     No facility-administered medications prior to visit.     PAST MEDICAL HISTORY: Past Medical History:  Diagnosis Date   Arthritis    Bladder neck contracture    Complication of anesthesia    Difficult intubation   Diabetes mellitus without complication (HCC)    Difficult intubation    Foley catheter in place    GERD (gastroesophageal reflux disease)    History of gastric ulcer    2007   History of kidney stones    History of prostate cancer    2004--  S/P PROSTATECTOMY   Hypertension    Urinary incontinence    Wears glasses       PAST SURGICAL HISTORY: Past Surgical History:  Procedure Laterality Date   BALLOON DILATION N/A 09/12/2013   Procedure: CYSTO BALLOON DILATION/RETROGRADE URETHROGRAM;  Surgeon: Martina Sinner, MD;  Location: [facility] [place];  Service: Urology;  Laterality: N/A;   BOTOX INJECTION N/A 01/08/2022   Procedure: BOTOX INJECTION;  Surgeon: Malen Gauze, MD;  Location: [facility];  Service: Urology;  Laterality: N/A;   BOTOX INJECTION N/A 08/13/2022   Procedure: BOTOX INJECTION;  Surgeon: Malen Gauze, MD;  Location: [facility];  Service: Urology;  Laterality: N/A;   BOTOX INJECTION  N/A 04/15/2023   Procedure: BOTOX INJECTION;  Surgeon: Malen Gauze, MD;  Location: [facility];  Service: Urology;  Laterality: N/A;   CYSTO/  URETHRAL DILATION/ BLADDER BX/ BILATERAL RETROGRADE PYELOGRAM  03-03-2010   CYSTO/ BALLOON DILATION AND VAPORIZATION BLADDER NECK & PROSTATIC FOSSA  04-28-2010   CYSTO/ BALLOON DILATION BLADDER NECK CONTRACTURE  07-23-2011;   11-17-2010;  08-08-2010   CYSTOSCOPY N/A 01/08/2022   Procedure: CYSTOSCOPY WITH INTRAVESICAL BOTOX 100 UNITS;  Surgeon: Malen Gauze, MD;  Location: [facility];  Service: Urology;  Laterality: N/A;   CYSTOSCOPY N/A 04/15/2023   Procedure: CYSTOSCOPY WITH FULGURATION OF BLEEDING;  Surgeon: Malen Gauze, MD;  Location: [facility];  Service: Urology;  Laterality: N/A;   CYSTOSCOPY WITH DIRECT VISION INTERNAL URETHROTOMY N/A 04/24/2021   Procedure: CYSTOSCOPY WITH DIRECT VISION INTERNAL URETHROTOMY;  Surgeon: Malen Gauze, MD;  Location: [facility];  Service: Urology;  Laterality: N/A;   CYSTOSCOPY WITH INJECTION N/A 08/13/2022   Procedure: CYSTOSCOPY WITH INJECTION;  Surgeon: Malen Gauze, MD;  Location: [facility];  Service: Urology;  Laterality: N/A;   FULGURATION OF LESION  04/24/2021   Procedure: FULGURATION OF BLADDER NECK;  Surgeon: Malen Gauze, MD;  Location: [facility];  Service: Urology;;   MITOMYCIN C INJECTION   04/24/2021   Procedure: MITOMYCIN C INJECTION ANTERIOR BLADDER WALL;  Surgeon: Malen Gauze, MD;  Location: [facility];  Service: Urology;;   PROSTATECTOMY  2004   RADICAL   TONSILLECTOMY  AS CHILD   TRANSURETHRAL INCISION AND RESECTION OF BLADDER NECK CONTRACTURE  09-01-2010   UMBILICAL HERNIA REPAIR       FAMILY HISTORY: Family History  Problem Relation Age of Onset   Heart attack Mother    Heart attack Father      SOCIAL HISTORY: Social History   Socioeconomic History   Marital status: Married    Spouse name: Chyrl Civatte   Number of children: Not on file   Years of education: Not on file   Highest education level: Not on file  Occupational History   Not on file  Tobacco Use   Smoking status: Never   Smokeless tobacco: Never  Vaping Use   Vaping Use: Never used  Substance and Sexual Activity   Alcohol use: Yes    Alcohol/week: 6.0 standard drinks of alcohol    Types: 6 Cans of beer per week   Drug use: No   Sexual activity: Not Currently  Other Topics Concern   Not on file  Social History Narrative   Not on file   Social Determinants of Health   Financial Resource Strain: Not on file  Food Insecurity: Not on file  Transportation Needs: Not on file  Physical Activity: Not on file  Stress: Not on file  Social Connections: Not on file  Intimate Partner Violence: Not on file     PHYSICAL EXAM  There were no vitals filed for this visit.   There is no height or weight on file to calculate BMI.  Generalized: Well developed, in no acute distress  Cardiology: normal rate and rhythm, no murmur auscultated  Respiratory: clear to auscultation bilaterally    Neurological examination  Mentation: Alert oriented to time, place, history taking. Follows all commands speech and language fluent Cranial nerve II-XII: Pupils were equal round reactive to light. Extraocular movements were full, visual field were full on confrontational test. Facial sensation and strength  were normal. Head turning and shoulder shrug  were normal and symmetric. Motor:  The motor testing reveals 5 over 5 strength of all 4 extremities. Good symmetric motor tone is noted throughout. Mild bilateral hand tremor noted with exam.  Gait and station: Gait is normal.    DIAGNOSTIC DATA (LABS, IMAGING, TESTING) - I reviewed patient records, labs, notes, testing and imaging myself where available.  Lab Results  Component Value Date   WBC 14.0 (H) 09/09/2022   HGB 14.7 09/09/2022   HCT 45.6 09/09/2022   MCV 92.1 09/09/2022   PLT 33 (L) 09/09/2022      Component Value Date/Time   NA 135 03/02/2023 1315   NA 139 07/24/2021 1042   K 4.0 03/02/2023 1315   CL 102 03/02/2023 1315   CO2 23 03/02/2023 1315   GLUCOSE 187 (H) 03/02/2023 1315   BUN 21 03/02/2023 1315   BUN 15 07/24/2021 1042   CREATININE 0.91 03/02/2023 1315   CALCIUM 9.2 03/02/2023 1315   PROT 6.8 09/09/2022 1917   PROT 7.2 07/21/2021 1123   ALBUMIN 4.2 09/09/2022 1917   ALBUMIN 4.8 (H) 07/21/2021 1123   AST 28 09/09/2022 1917   ALT 21 09/09/2022 1917   ALKPHOS 37 (L) 09/09/2022 1917   BILITOT 1.2 09/09/2022 1917   BILITOT 0.5 07/21/2021 1123   GFRNONAA >60 03/02/2023 1315   GFRAA  11/17/2010 1403    >60        The eGFR has been calculated using the MDRD equation. This calculation has not been validated in all clinical situations. eGFR's persistently <60 mL/min signify possible Chronic Kidney Disease.   No results found for: "CHOL", "HDL", "LDLCALC", "LDLDIRECT", "TRIG", "CHOLHDL" Lab Results  Component Value Date   HGBA1C 7.1 (H) 08/11/2022   No results found for: "VITAMINB12" Lab Results  Component Value Date   TSH 1.720 07/21/2021        No data to display               No data to display           ASSESSMENT AND PLAN  77 y.o. year old adult  has a past medical history of Arthritis, Bladder neck contracture, Complication of anesthesia, Diabetes mellitus without complication  (HCC), Difficult intubation, Foley catheter in place, GERD (gastroesophageal reflux disease), History of gastric ulcer, History of kidney stones, History of prostate cancer, Hypertension, Urinary incontinence, and Wears glasses. here with    No diagnosis found.  Estee has tolerated primidone 50mg  rwice daily without adverse effects. Tremor is stable. He will continue current treatment plan. Healthy lifestyle habits encouraged. He will continue follow up with PCP as directed. Labs reviewed in Epic for today's visit. He will follow up with me in 6 months.    No orders of the defined types were placed in this encounter.     No orders of the defined types were placed in this encounter.      Shawnie Dapper, MSN, FNP-C 04/26/2023, 12:55 PM  [facility] [address] [place], [place] [address] [phone]

## 2023-04-27 ENCOUNTER — Encounter: Payer: Self-pay | Admitting: Family Medicine

## 2023-04-27 ENCOUNTER — Ambulatory Visit (INDEPENDENT_AMBULATORY_CARE_PROVIDER_SITE_OTHER): Payer: Medicare Other | Admitting: Family Medicine

## 2023-04-27 VITALS — BP 137/81 | HR 85 | Ht 68.0 in | Wt 259.0 lb

## 2023-04-27 DIAGNOSIS — G250 Essential tremor: Secondary | ICD-10-CM | POA: Diagnosis not present

## 2023-04-27 MED ORDER — PRIMIDONE 50 MG PO TABS: 50.0000 mg | ORAL_TABLET | Freq: Two times a day (BID) | ORAL | 3 refills | Status: DC

## 2023-04-29 DIAGNOSIS — E559 Vitamin D deficiency, unspecified: Secondary | ICD-10-CM | POA: Diagnosis not present

## 2023-04-29 DIAGNOSIS — E119 Type 2 diabetes mellitus without complications: Secondary | ICD-10-CM | POA: Diagnosis not present

## 2023-04-29 LAB — PRIMIDONE, SERUM
Phenobarbital, Serum: NOT DETECTED ug/mL (ref 15–40)
Primidone Lvl: 3 ug/mL — ABNORMAL LOW (ref 5.0–12.0)

## 2023-05-04 DIAGNOSIS — I10 Essential (primary) hypertension: Secondary | ICD-10-CM | POA: Diagnosis not present

## 2023-05-04 DIAGNOSIS — E1140 Type 2 diabetes mellitus with diabetic neuropathy, unspecified: Secondary | ICD-10-CM | POA: Diagnosis not present

## 2023-05-04 DIAGNOSIS — H6122 Impacted cerumen, left ear: Secondary | ICD-10-CM | POA: Diagnosis not present

## 2023-05-04 DIAGNOSIS — E785 Hyperlipidemia, unspecified: Secondary | ICD-10-CM | POA: Diagnosis not present

## 2023-05-04 DIAGNOSIS — E1165 Type 2 diabetes mellitus with hyperglycemia: Secondary | ICD-10-CM | POA: Diagnosis not present

## 2023-05-04 DIAGNOSIS — Z6839 Body mass index (BMI) 39.0-39.9, adult: Secondary | ICD-10-CM | POA: Diagnosis not present

## 2023-05-04 DIAGNOSIS — G250 Essential tremor: Secondary | ICD-10-CM | POA: Diagnosis not present

## 2023-05-06 DIAGNOSIS — D485 Neoplasm of uncertain behavior of skin: Secondary | ICD-10-CM | POA: Diagnosis not present

## 2023-05-06 DIAGNOSIS — L570 Actinic keratosis: Secondary | ICD-10-CM | POA: Diagnosis not present

## 2023-05-06 DIAGNOSIS — C44319 Basal cell carcinoma of skin of other parts of face: Secondary | ICD-10-CM | POA: Diagnosis not present

## 2023-05-06 DIAGNOSIS — D0439 Carcinoma in situ of skin of other parts of face: Secondary | ICD-10-CM | POA: Diagnosis not present

## 2023-05-13 DIAGNOSIS — C44321 Squamous cell carcinoma of skin of nose: Secondary | ICD-10-CM | POA: Diagnosis not present

## 2023-05-13 DIAGNOSIS — D485 Neoplasm of uncertain behavior of skin: Secondary | ICD-10-CM | POA: Diagnosis not present

## 2023-05-13 DIAGNOSIS — C44329 Squamous cell carcinoma of skin of other parts of face: Secondary | ICD-10-CM | POA: Diagnosis not present

## 2023-05-26 ENCOUNTER — Ambulatory Visit: Payer: Medicare Other | Admitting: Family Medicine

## 2023-05-31 ENCOUNTER — Ambulatory Visit (INDEPENDENT_AMBULATORY_CARE_PROVIDER_SITE_OTHER): Payer: Medicare Other | Admitting: Urology

## 2023-05-31 ENCOUNTER — Encounter: Payer: Self-pay | Admitting: Urology

## 2023-05-31 VITALS — BP 127/69 | HR 60

## 2023-05-31 DIAGNOSIS — N319 Neuromuscular dysfunction of bladder, unspecified: Secondary | ICD-10-CM

## 2023-05-31 DIAGNOSIS — Z87440 Personal history of urinary (tract) infections: Secondary | ICD-10-CM | POA: Diagnosis not present

## 2023-05-31 DIAGNOSIS — N390 Urinary tract infection, site not specified: Secondary | ICD-10-CM

## 2023-05-31 NOTE — Patient Instructions (Signed)

## 2023-05-31 NOTE — Progress Notes (Signed)
05/31/2023 1:43 PM   Marc Burns 12-Aug-1946 [id number]  Referring provider: Juliette Alcide, MD [address] [place],  [place] [address]  Followup urinary incontinence and recurrent UTI   HPI: Marc Burns is a 77yo here for followup for recurrent UTI and neurogenic bladder. He uses 3-4 pads per day which is improved. He denies any UTIs since last visit. He changed his foley 1.5 weeks after the procedure. Current foley has been in for 4 weeks.    PMH: Past Medical History:  Diagnosis Date   Arthritis    Bladder neck contracture    Complication of anesthesia    Difficult intubation   Diabetes mellitus without complication (HCC)    Difficult intubation    Foley catheter in place    GERD (gastroesophageal reflux disease)    History of gastric ulcer    2007   History of kidney stones    History of prostate cancer    2004--  S/P PROSTATECTOMY   Hypertension    Urinary incontinence    Wears glasses     Surgical History: Past Surgical History:  Procedure Laterality Date   BALLOON DILATION N/A 09/12/2013   Procedure: CYSTO BALLOON DILATION/RETROGRADE URETHROGRAM;  Surgeon: Marc Sinner, MD;  Location: [facility];  Service: Urology;  Laterality: N/A;   BOTOX INJECTION N/A 01/08/2022   Procedure: BOTOX INJECTION;  Surgeon: Marc Gauze, MD;  Location: [facility];  Service: Urology;  Laterality: N/A;   BOTOX INJECTION N/A 08/13/2022   Procedure: BOTOX INJECTION;  Surgeon: Marc Gauze, MD;  Location: [facility];  Service: Urology;  Laterality: N/A;   BOTOX INJECTION N/A 04/15/2023   Procedure: BOTOX INJECTION;  Surgeon: Marc Gauze, MD;  Location: [facility];  Service: Urology;  Laterality: N/A;   CYSTO/  URETHRAL DILATION/ BLADDER BX/ BILATERAL RETROGRADE PYELOGRAM  03-03-2010   CYSTO/ BALLOON DILATION AND VAPORIZATION BLADDER NECK & PROSTATIC FOSSA  04-28-2010   CYSTO/ BALLOON DILATION BLADDER NECK CONTRACTURE  07-23-2011;   11-17-2010;  08-08-2010    CYSTOSCOPY N/A 01/08/2022   Procedure: CYSTOSCOPY WITH INTRAVESICAL BOTOX 100 UNITS;  Surgeon: Marc Gauze, MD;  Location: [facility];  Service: Urology;  Laterality: N/A;   CYSTOSCOPY N/A 04/15/2023   Procedure: CYSTOSCOPY WITH FULGURATION OF BLEEDING;  Surgeon: Marc Gauze, MD;  Location: [facility];  Service: Urology;  Laterality: N/A;   CYSTOSCOPY WITH DIRECT VISION INTERNAL URETHROTOMY N/A 04/24/2021   Procedure: CYSTOSCOPY WITH DIRECT VISION INTERNAL URETHROTOMY;  Surgeon: Marc Gauze, MD;  Location: [facility];  Service: Urology;  Laterality: N/A;   CYSTOSCOPY WITH INJECTION N/A 08/13/2022   Procedure: CYSTOSCOPY WITH INJECTION;  Surgeon: Marc Gauze, MD;  Location: [facility];  Service: Urology;  Laterality: N/A;   FULGURATION OF LESION  04/24/2021   Procedure: FULGURATION OF BLADDER NECK;  Surgeon: Marc Gauze, MD;  Location: [facility];  Service: Urology;;   MITOMYCIN C INJECTION  04/24/2021   Procedure: MITOMYCIN C INJECTION ANTERIOR BLADDER WALL;  Surgeon: Marc Gauze, MD;  Location: [facility];  Service: Urology;;   PROSTATECTOMY  2004   RADICAL   TONSILLECTOMY  AS CHILD   TRANSURETHRAL INCISION AND RESECTION OF BLADDER NECK CONTRACTURE  09-01-2010   UMBILICAL HERNIA REPAIR      Home Medications:  Allergies as of 05/31/2023       Reactions   Sulfa Antibiotics Itching, Rash        Medication List  Accurate as of May 31, 2023  1:43 PM. If you have any questions, ask your nurse or doctor.          amLODipine-benazepril 5-20 MG capsule Commonly known as: LOTREL Take 1 capsule by mouth every morning.   aspirin EC 81 MG tablet Take 81 mg by mouth daily. Swallow whole.   CENTRUM SILVER ADULT 50+ PO Take 1 tablet by mouth daily.   empagliflozin 25 MG Tabs tablet Commonly known as: JARDIANCE Take 25 mg by mouth daily.   furosemide 20 MG tablet Commonly known as: LASIX Take 20 mg by mouth daily.   gabapentin 600 MG tablet Commonly known  as: NEURONTIN Take 600 mg by mouth 3 (three) times daily.   ibuprofen 200 MG tablet Commonly known as: ADVIL Take 400 mg by mouth every 6 (six) hours as needed for moderate pain or headache.   metoprolol tartrate 25 MG tablet Commonly known as: LOPRESSOR Take 25 mg by mouth 2 (two) times daily.   omeprazole 20 MG capsule Commonly known as: PRILOSEC Take 20 mg by mouth daily.   Ozempic (0.25 or 0.5 MG/DOSE) 2 MG/1.5ML Sopn Generic drug: Semaglutide(0.25 or 0.5MG /DOS) Inject 0.5 mg into the skin every Sunday.   primidone 50 MG tablet Commonly known as: MYSOLINE Take 1 tablet (50 mg total) by mouth 2 (two) times daily.   rosuvastatin 10 MG tablet Commonly known as: CRESTOR Take 10 mg by mouth at bedtime.   Vitamin D3 50 MCG (2000 UT) capsule Take 2,000 Units by mouth 2 (two) times daily.        Allergies:  Allergies  Allergen Reactions   Sulfa Antibiotics Itching and Rash    Family History: Family History  Problem Relation Age of Onset   Heart attack Mother    Heart attack Father     Social History:  reports that he has never smoked. He has never used smokeless tobacco. He reports current alcohol use of about 6.0 standard drinks of alcohol per week. He reports that he does not use drugs.  ROS: All other review of systems were reviewed and are negative except what is noted above in HPI  Physical Exam: BP 127/69   Pulse 60   Constitutional:  Alert and oriented, No acute distress. HEENT: [place] AT, moist mucus membranes.  Trachea midline, no masses. Cardiovascular: No clubbing, cyanosis, or edema. Respiratory: Normal respiratory effort, no increased work of breathing. GI: Abdomen is soft, nontender, nondistended, no abdominal masses GU: No CVA tenderness.  Lymph: No cervical or inguinal lymphadenopathy. Skin: No rashes, bruises or suspicious lesions. Neurologic: Grossly intact, no focal deficits, moving all 4 extremities. Psychiatric: Normal mood and  affect.  Laboratory Data: Lab Results  Component Value Date   WBC 14.0 (H) 09/09/2022   HGB 14.7 09/09/2022   HCT 45.6 09/09/2022   MCV 92.1 09/09/2022   PLT 33 (L) 09/09/2022    Lab Results  Component Value Date   CREATININE 0.91 03/02/2023    No results found for: "PSA"  No results found for: "TESTOSTERONE"  Lab Results  Component Value Date   HGBA1C 7.1 (H) 08/11/2022    Urinalysis    Component Value Date/Time   COLORURINE YELLOW 02/26/2010 0930   APPEARANCEUR CLOUDY (A) 02/26/2010 0930   LABSPEC 1.019 02/26/2010 0930   PHURINE 5.5 02/26/2010 0930   GLUCOSEU NEGATIVE 02/26/2010 0930   HGBUR LARGE (A) 02/26/2010 0930   BILIRUBINUR NEGATIVE 02/26/2010 0930   KETONESUR NEGATIVE 02/26/2010 0930   PROTEINUR 30 (A)  02/26/2010 0930   UROBILINOGEN 0.2 02/26/2010 0930   NITRITE NEGATIVE 02/26/2010 0930   LEUKOCYTESUR SMALL (A) 02/26/2010 0930    Lab Results  Component Value Date   BACTERIA FEW (A) 02/26/2010    Pertinent Imaging: *** No results found for this or any previous visit.  No results found for this or any previous visit.  No results found for this or any previous visit.  No results found for this or any previous visit.  Results for orders placed during the hospital encounter of 07/20/17  US Renal  Narrative CLINICAL DATA:  77 year old male with urinary retention. Self catheterization. Initial encounter.  EXAM: RENAL / URINARY TRACT ULTRASOUND COMPLETE  COMPARISON:  None.  FINDINGS: Right Kidney:  Length: 11.3 cm. Echogenicity within normal limits. No mass or hydronephrosis visualized.  Left Kidney:  Length: 11.8 cm. Echogenicity within normal limits. No mass or hydronephrosis visualized.  Bladder:  Decompressed and not adequately assessed  IMPRESSION: Kidneys unremarkable  Bladder decompressed and not assessed   Electronically Signed By: Lacy Duverney M.D. On: 07/20/2017 12:25  No valid procedures specified. No  results found for this or any previous visit.  No results found for this or any previous visit.   Assessment & Plan:    1. Neurogenic bladder *** - Urinalysis, Routine w reflex microscopic  2. Recurrent UTI ***   No follow-ups on file.  Wilkie Aye, MD  [facility] [place]

## 2023-06-03 DIAGNOSIS — C44329 Squamous cell carcinoma of skin of other parts of face: Secondary | ICD-10-CM | POA: Diagnosis not present

## 2023-06-03 DIAGNOSIS — C44319 Basal cell carcinoma of skin of other parts of face: Secondary | ICD-10-CM | POA: Diagnosis not present

## 2023-06-17 DIAGNOSIS — C44321 Squamous cell carcinoma of skin of nose: Secondary | ICD-10-CM | POA: Diagnosis not present

## 2023-06-21 DIAGNOSIS — H2513 Age-related nuclear cataract, bilateral: Secondary | ICD-10-CM | POA: Diagnosis not present

## 2023-07-28 DIAGNOSIS — E1165 Type 2 diabetes mellitus with hyperglycemia: Secondary | ICD-10-CM | POA: Diagnosis not present

## 2023-07-28 DIAGNOSIS — E1169 Type 2 diabetes mellitus with other specified complication: Secondary | ICD-10-CM | POA: Diagnosis not present

## 2023-08-03 DIAGNOSIS — E1140 Type 2 diabetes mellitus with diabetic neuropathy, unspecified: Secondary | ICD-10-CM | POA: Diagnosis not present

## 2023-08-03 DIAGNOSIS — G250 Essential tremor: Secondary | ICD-10-CM | POA: Diagnosis not present

## 2023-08-03 DIAGNOSIS — I10 Essential (primary) hypertension: Secondary | ICD-10-CM | POA: Diagnosis not present

## 2023-08-03 DIAGNOSIS — E1165 Type 2 diabetes mellitus with hyperglycemia: Secondary | ICD-10-CM | POA: Diagnosis not present

## 2023-08-03 DIAGNOSIS — H6122 Impacted cerumen, left ear: Secondary | ICD-10-CM | POA: Diagnosis not present

## 2023-08-03 DIAGNOSIS — E049 Nontoxic goiter, unspecified: Secondary | ICD-10-CM | POA: Diagnosis not present

## 2023-08-03 DIAGNOSIS — E785 Hyperlipidemia, unspecified: Secondary | ICD-10-CM | POA: Diagnosis not present

## 2023-08-03 DIAGNOSIS — Z6839 Body mass index (BMI) 39.0-39.9, adult: Secondary | ICD-10-CM | POA: Diagnosis not present

## 2023-08-05 DIAGNOSIS — E049 Nontoxic goiter, unspecified: Secondary | ICD-10-CM | POA: Diagnosis not present

## 2023-09-01 ENCOUNTER — Ambulatory Visit (INDEPENDENT_AMBULATORY_CARE_PROVIDER_SITE_OTHER): Payer: Medicare Other | Admitting: Urology

## 2023-09-01 VITALS — BP 130/66 | HR 60

## 2023-09-01 DIAGNOSIS — N319 Neuromuscular dysfunction of bladder, unspecified: Secondary | ICD-10-CM | POA: Diagnosis not present

## 2023-09-01 DIAGNOSIS — N390 Urinary tract infection, site not specified: Secondary | ICD-10-CM

## 2023-09-01 NOTE — Progress Notes (Signed)
09/01/2023 3:27 PM   Lujean Rave [birth year] [id number]  Referring provider: Juliette Alcide, MD [address] [place],  [place] [address]  Followup urinary incontinence   HPI: Mr Bombara is a 77yo here for followup for urinary retention and urinary incontinence. He has rare incontinence around his foley. He has intervesical botox 200 units 5 months ago. He is having to change his foley monthly. NO hematuria or pelvic pain. No other complaints today   PMH: Past Medical History:  Diagnosis Date   Arthritis    Bladder neck contracture    Complication of anesthesia    Difficult intubation   Diabetes mellitus without complication (HCC)    Difficult intubation    Foley catheter in place    GERD (gastroesophageal reflux disease)    History of gastric ulcer    2007   History of kidney stones    History of prostate cancer    2004--  S/P PROSTATECTOMY   Hypertension    Urinary incontinence    Wears glasses     Surgical History: Past Surgical History:  Procedure Laterality Date   BALLOON DILATION N/A 09/12/2013   Procedure: CYSTO BALLOON DILATION/RETROGRADE URETHROGRAM;  Surgeon: Martina Sinner, MD;  Location: [facility] [place];  Service: Urology;  Laterality: N/A;   BOTOX INJECTION N/A 01/08/2022   Procedure: BOTOX INJECTION;  Surgeon: Malen Gauze, MD;  Location: [facility];  Service: Urology;  Laterality: N/A;   BOTOX INJECTION N/A 08/13/2022   Procedure: BOTOX INJECTION;  Surgeon: Malen Gauze, MD;  Location: [facility];  Service: Urology;  Laterality: N/A;   BOTOX INJECTION N/A 04/15/2023   Procedure: BOTOX INJECTION;  Surgeon: Malen Gauze, MD;  Location: [facility];  Service: Urology;  Laterality: N/A;   CYSTO/  URETHRAL DILATION/ BLADDER BX/ BILATERAL RETROGRADE PYELOGRAM  03-03-2010   CYSTO/ BALLOON DILATION AND VAPORIZATION BLADDER NECK & PROSTATIC FOSSA  04-28-2010   CYSTO/ BALLOON DILATION BLADDER NECK CONTRACTURE  07-23-2011;   11-17-2010;   08-08-2010   CYSTOSCOPY N/A 01/08/2022   Procedure: CYSTOSCOPY WITH INTRAVESICAL BOTOX 100 UNITS;  Surgeon: Malen Gauze, MD;  Location: [facility];  Service: Urology;  Laterality: N/A;   CYSTOSCOPY N/A 04/15/2023   Procedure: CYSTOSCOPY WITH FULGURATION OF BLEEDING;  Surgeon: Malen Gauze, MD;  Location: [facility];  Service: Urology;  Laterality: N/A;   CYSTOSCOPY WITH DIRECT VISION INTERNAL URETHROTOMY N/A 04/24/2021   Procedure: CYSTOSCOPY WITH DIRECT VISION INTERNAL URETHROTOMY;  Surgeon: Malen Gauze, MD;  Location: [facility];  Service: Urology;  Laterality: N/A;   CYSTOSCOPY WITH INJECTION N/A 08/13/2022   Procedure: CYSTOSCOPY WITH INJECTION;  Surgeon: Malen Gauze, MD;  Location: [facility];  Service: Urology;  Laterality: N/A;   FULGURATION OF LESION  04/24/2021   Procedure: FULGURATION OF BLADDER NECK;  Surgeon: Malen Gauze, MD;  Location: [facility];  Service: Urology;;   MITOMYCIN C INJECTION  04/24/2021   Procedure: MITOMYCIN C INJECTION ANTERIOR BLADDER WALL;  Surgeon: Malen Gauze, MD;  Location: [facility];  Service: Urology;;   PROSTATECTOMY  2004   RADICAL   TONSILLECTOMY  AS CHILD   TRANSURETHRAL INCISION AND RESECTION OF BLADDER NECK CONTRACTURE  09-01-2010   UMBILICAL HERNIA REPAIR      Home Medications:  Allergies as of 09/01/2023       Reactions   Sulfa Antibiotics Itching, Rash        Medication List        Accurate as  of September 01, 2023  3:27 PM. If you have any questions, ask your nurse or doctor.          amLODipine-benazepril 5-20 MG capsule Commonly known as: LOTREL Take 1 capsule by mouth every morning.   aspirin EC 81 MG tablet Take 81 mg by mouth daily. Swallow whole.   CENTRUM SILVER ADULT 50+ PO Take 1 tablet by mouth daily.   empagliflozin 25 MG Tabs tablet Commonly known as: JARDIANCE Take 25 mg by mouth daily.   furosemide 20 MG tablet Commonly known as: LASIX Take 20 mg by mouth daily.   gabapentin 600 MG  tablet Commonly known as: NEURONTIN Take 600 mg by mouth 3 (three) times daily.   ibuprofen 200 MG tablet Commonly known as: ADVIL Take 400 mg by mouth every 6 (six) hours as needed for moderate pain or headache.   metoprolol tartrate 25 MG tablet Commonly known as: LOPRESSOR Take 25 mg by mouth 2 (two) times daily.   omeprazole 20 MG capsule Commonly known as: PRILOSEC Take 20 mg by mouth daily.   Ozempic (0.25 or 0.5 MG/DOSE) 2 MG/1.5ML Sopn Generic drug: Semaglutide(0.25 or 0.5MG /DOS) Inject 0.5 mg into the skin every Sunday.   primidone 50 MG tablet Commonly known as: MYSOLINE Take 1 tablet (50 mg total) by mouth 2 (two) times daily.   rosuvastatin 10 MG tablet Commonly known as: CRESTOR Take 10 mg by mouth at bedtime.   Vitamin D3 50 MCG (2000 UT) capsule Take 2,000 Units by mouth 2 (two) times daily.        Allergies:  Allergies  Allergen Reactions   Sulfa Antibiotics Itching and Rash    Family History: Family History  Problem Relation Age of Onset   Heart attack Mother    Heart attack Father     Social History:  reports that he has never smoked. He has never used smokeless tobacco. He reports current alcohol use of about 6.0 standard drinks of alcohol per week. He reports that he does not use drugs.  ROS: All other review of systems were reviewed and are negative except what is noted above in HPI  Physical Exam: BP 130/66   Pulse 60   Constitutional:  Alert and oriented, No acute distress. HEENT: [place] AT, moist mucus membranes.  Trachea midline, no masses. Cardiovascular: No clubbing, cyanosis, or edema. Respiratory: Normal respiratory effort, no increased work of breathing. GI: Abdomen is soft, nontender, nondistended, no abdominal masses GU: No CVA tenderness.  Lymph: No cervical or inguinal lymphadenopathy. Skin: No rashes, bruises or suspicious lesions. Neurologic: Grossly intact, no focal deficits, moving all 4 extremities. Psychiatric:  Normal mood and affect.  Laboratory Data: Lab Results  Component Value Date   WBC 14.0 (H) 09/09/2022   HGB 14.7 09/09/2022   HCT 45.6 09/09/2022   MCV 92.1 09/09/2022   PLT 33 (L) 09/09/2022    Lab Results  Component Value Date   CREATININE 0.91 03/02/2023    No results found for: "PSA"  No results found for: "TESTOSTERONE"  Lab Results  Component Value Date   HGBA1C 7.1 (H) 08/11/2022    Urinalysis    Component Value Date/Time   COLORURINE YELLOW 02/26/2010 0930   APPEARANCEUR CLOUDY (A) 02/26/2010 0930   LABSPEC 1.019 02/26/2010 0930   PHURINE 5.5 02/26/2010 0930   GLUCOSEU NEGATIVE 02/26/2010 0930   HGBUR LARGE (A) 02/26/2010 0930   BILIRUBINUR NEGATIVE 02/26/2010 0930   KETONESUR NEGATIVE 02/26/2010 0930   PROTEINUR 30 (A) 02/26/2010 0930  UROBILINOGEN 0.2 02/26/2010 0930   NITRITE NEGATIVE 02/26/2010 0930   LEUKOCYTESUR SMALL (A) 02/26/2010 0930    Lab Results  Component Value Date   BACTERIA FEW (A) 02/26/2010    Pertinent Imaging:  No results found for this or any previous visit.  No results found for this or any previous visit.  No results found for this or any previous visit.  No results found for this or any previous visit.  Results for orders placed during the hospital encounter of 07/20/17  US Renal  Narrative CLINICAL DATA:  77 year old male with urinary retention. Self catheterization. Initial encounter.  EXAM: RENAL / URINARY TRACT ULTRASOUND COMPLETE  COMPARISON:  None.  FINDINGS: Right Kidney:  Length: 11.3 cm. Echogenicity within normal limits. No mass or hydronephrosis visualized.  Left Kidney:  Length: 11.8 cm. Echogenicity within normal limits. No mass or hydronephrosis visualized.  Bladder:  Decompressed and not adequately assessed  IMPRESSION: Kidneys unremarkable  Bladder decompressed and not assessed   Electronically Signed By: Lacy Duverney M.D. On: 07/20/2017 12:25  No valid procedures  specified. No results found for this or any previous visit.  No results found for this or any previous visit.   Assessment & Plan:    1. Neurogenic bladder We will schedule for intravesical botox - Ambulatory Referral For Surgery Scheduling     No follow-ups on file.  Wilkie Aye, MD  [facility] [place]

## 2023-09-07 ENCOUNTER — Encounter: Payer: Self-pay | Admitting: Urology

## 2023-09-07 DIAGNOSIS — L570 Actinic keratosis: Secondary | ICD-10-CM | POA: Diagnosis not present

## 2023-09-07 DIAGNOSIS — D0461 Carcinoma in situ of skin of right upper limb, including shoulder: Secondary | ICD-10-CM | POA: Diagnosis not present

## 2023-09-07 DIAGNOSIS — D235 Other benign neoplasm of skin of trunk: Secondary | ICD-10-CM | POA: Diagnosis not present

## 2023-09-07 DIAGNOSIS — D485 Neoplasm of uncertain behavior of skin: Secondary | ICD-10-CM | POA: Diagnosis not present

## 2023-09-07 NOTE — Patient Instructions (Signed)
Botulinum Toxin Bladder Injection  A botulinum toxin bladder injection is a procedure to treat an overactive bladder. During the procedure, a drug called botulinum toxin is injected into the bladder through a long, thin needle. This drug relaxes the bladder muscles and reduces overactivity. You may need this procedure if your medicines are not working or you cannot take them. The procedure may be repeated as needed. The treatment is done once and it usually lasts for 6 months. Your health care provider will monitor you to see how well you respond. Tell a health care provider about: Any allergies you have. All medicines you are taking, including vitamins, herbs, eye drops, creams, and over-the-counter medicines. Any problems you or family members have had with anesthetic medicines. Any bleeding problems you have. Any surgeries you have had. Any medical conditions you have. Any previous reactions to a botulinum toxin injection. Any symptoms of urinary tract infection. These include chills, fever, a burning feeling when passing urine, and needing to pass urine often. Whether you are pregnant or may be pregnant. What are the risks? Generally this is a safe procedure. However, problems may occur, including: Not being able to pass urine. If this happens, you may need to have your bladder emptied with a thin tube (urinary catheter). Bleeding. Urinary tract infection. Allergic reaction to the botulinum toxin. Pain or burning when passing urine. Damage to nearby structures or organs. What happens before the procedure? When to stop eating and drinking Follow instructions from your health care provider about what you may eat and drink before your procedure. These may include: 8 hours before the procedure Stop eating most foods. Do not eat meat, fried foods, or fatty foods. Eat only light foods, such as toast or crackers. All liquids are okay except energy drinks and alcohol. 6 hours before the  procedure Stop eating. Drink only clear liquids, such as water, clear fruit juice, black coffee, plain tea, and sports drinks. Do not drink energy drinks or alcohol. 2 hours before the procedure Stop drinking all liquids. You may be allowed to take medicines with small sips of water. If you do not follow your health care provider's instructions, your procedure may be delayed or canceled. Medicines Ask your health care provider about: Changing or stopping your regular medicines. This is especially important if you are taking diabetes medicines or blood thinners. Taking medicines such as aspirin and ibuprofen. These medicines can thin your blood. Do not take these medicines unless your health care provider tells you to take them. Taking over-the-counter medicines, vitamins, herbs, and supplements. General instructions Ask your health care provider what steps will be taken to help prevent infection. These steps may include: Removing hair at the procedure site. Washing skin with a germ-killing soap. Taking antibiotic medicine. If you will be going home right after the procedure, plan to have a responsible adult: Take you home from the hospital or clinic. You will not be allowed to drive. Care for you for the time you are told. What happens during the procedure?  You will be asked to empty your bladder. An IV will be inserted into one of your veins. You will be given one or more of the following: A medicine to help you relax (sedative). A medicine to numb the area (local anesthetic). A medicine to make you fall asleep (general anesthetic). A long, thin scope called a cystoscope will be passed into your bladder through the part of the body that carries urine from your bladder (urethra). The cystoscope  will be used to fill your bladder with water. A long needle will be passed through the cystoscope and into the bladder. The botulinum toxin will be injected into your bladder. It may be  injected into multiple areas of your bladder. The cystoscope will be removed and your bladder will be emptied with a urinary catheter. The procedure may vary among health care providers and hospitals. What can I expect after the procedure? After your procedure, it is common to have: Blood-tinged urine. Burning or soreness when you pass urine. Follow these instructions at home: Medicines Take over-the-counter and prescription medicines only as told by your health care provider. If you were prescribed an antibiotic medicine, take it as told by your health care provider. Do not stop using the antibiotic even if you start to feel better. General instructions  If you were given a sedative during the procedure, it can affect you for several hours. Do not drive or operate machinery until your health care provider says that it is safe. Drink enough fluid to keep your urine pale yellow. Return to your normal activities as told by your health care provider. Ask your health care provider what activities are safe for you. Keep all follow-up visits. Contact a health care provider if you have: A fever or chills. Blood-tinged urine for more than one day after your procedure. Worsening pain or burning when you pass urine. Pain or burning when passing urine for more than two days after your procedure. Trouble emptying your bladder. Get help right away if you: Have bright red blood in your urine. Are unable to pass urine. Summary A botulinum toxin bladder injection is a procedure to treat an overactive bladder. This is generally a safe procedure. However, problems may occur, including not being able to pass urine, bleeding, infection, pain, and an allergic reaction to the botulinum toxin. You will be told when to stop eating and drinking, and what medicines to change or stop. Follow instructions carefully. After the procedure, it is common to have blood in your urine and to have soreness or burning when  passing urine. Contact a health care provider if you have a fever, blood in your urine for more than a few days, or trouble passing urine. Get help right away if you have bright red blood in your urine, or if you are unable to pass urine. This information is not intended to replace advice given to you by your health care provider. Make sure you discuss any questions you have with your health care provider. Document Revised: 06/13/2021 Document Reviewed: 06/13/2021 Elsevier Patient Education  2024 [employer].

## 2023-09-20 ENCOUNTER — Other Ambulatory Visit: Payer: Self-pay | Admitting: Urology

## 2023-09-20 DIAGNOSIS — R609 Edema, unspecified: Secondary | ICD-10-CM | POA: Diagnosis not present

## 2023-09-20 DIAGNOSIS — E1169 Type 2 diabetes mellitus with other specified complication: Secondary | ICD-10-CM | POA: Diagnosis not present

## 2023-09-20 DIAGNOSIS — E559 Vitamin D deficiency, unspecified: Secondary | ICD-10-CM | POA: Diagnosis not present

## 2023-09-30 DIAGNOSIS — D485 Neoplasm of uncertain behavior of skin: Secondary | ICD-10-CM | POA: Diagnosis not present

## 2023-09-30 DIAGNOSIS — L905 Scar conditions and fibrosis of skin: Secondary | ICD-10-CM | POA: Diagnosis not present

## 2023-10-14 DIAGNOSIS — C44622 Squamous cell carcinoma of skin of right upper limb, including shoulder: Secondary | ICD-10-CM | POA: Diagnosis not present

## 2023-10-14 DIAGNOSIS — Z23 Encounter for immunization: Secondary | ICD-10-CM | POA: Diagnosis not present

## 2023-10-18 NOTE — Patient Instructions (Addendum)
Marc Burns  10/18/2023     @PREFPERIOPPHARMACY @   Your procedure is scheduled on 10/21/23.  Report to [facility] at 0830 A.M.  Call this number if you have problems the morning of surgery:  [phone]  If you experience any cold or flu symptoms such as cough, fever, chills, shortness of breath, etc. between now and your scheduled surgery, please notify us at the above number.   Remember:  Do not eat or drink after midnight.   You may drink clear liquids until 0630 .    Clear liquids allowed are:                    Water, Juice (No red color; non-citric and without pulp; diabetics please choose diet or no sugar options), Carbonated beverages (diabetics please choose diet or no sugar options), Clear Tea (No creamer, milk, or cream, including half & half and powdered creamer), Black Coffee Only (No creamer, milk or cream, including half & half and powdered creamer), and Clear Sports drink (No red color; diabetics please choose diet or no sugar options)    Take these medicines the morning of surgery with A SIP OF WATER neurontin, metoprolol, prilosec, mysoline   LAST DOSE OF JARDIANCE TO BE 10/27 LAST DOSE OF OZEMPIC TO BE 10/23    Do not wear jewelry, make-up or nail polish, including gel polish,  artificial nails, or any other type of covering on natural nails (fingers and  toes).  Do not wear lotions, powders, or perfumes, or deodorant.  Do not shave 48 hours prior to surgery.  Men may shave face and neck.  Do not bring valuables to the hospital.  [facility] is not responsible for any belongings or valuables.  Contacts, dentures or bridgework may not be worn into surgery.  Leave your suitcase in the car.  After surgery it may be brought to your room.  For patients admitted to the hospital, discharge time will be determined by your treatment team.  Patients discharged the day of surgery will not be allowed to drive home.   Name and phone number of your driver:    family Special instructions:  do not smoke or vape 24 hours prior to procedure  Please read over the following fact sheets that you were given. Surgical Site Infection Prevention, Anesthesia Post-op Instructions, and Care and Recovery After Surgery      PATIENT INSTRUCTIONS POST-ANESTHESIA  IMMEDIATELY FOLLOWING SURGERY:  Do not drive or operate machinery for the first twenty four hours after surgery.  Do not make any important decisions for twenty four hours after surgery or while taking narcotic pain medications or sedatives.  If you develop intractable nausea and vomiting or a severe headache please notify your doctor immediately.  FOLLOW-UP:  Please make an appointment with your surgeon as instructed. You do not need to follow up with anesthesia unless specifically instructed to do so.  WOUND CARE INSTRUCTIONS (if applicable):  Keep a dry clean dressing on the anesthesia/puncture wound site if there is drainage.  Once the wound has quit draining you may leave it open to air.  Generally you should leave the bandage intact for twenty four hours unless there is drainage.  If the epidural site drains for more than 36-48 hours please call the anesthesia department.  QUESTIONS?:  Please feel free to call your physician or the hospital operator if you have any questions, and they will be happy to assist you.  How to Use Chlorhexidine Before Surgery Chlorhexidine gluconate (CHG) is a germ-killing (antiseptic) solution that is used to clean the skin. It can get rid of the bacteria that normally live on the skin and can keep them away for about 24 hours. To clean your skin with CHG, you may be given: A CHG solution to use in the shower or as part of a sponge bath. A prepackaged cloth that contains CHG. Cleaning your skin with CHG may help lower the risk for infection: While you are staying in the intensive care unit of the hospital. If you have a vascular access, such as a central line, to  provide short-term or long-term access to your veins. If you have a catheter to drain urine from your bladder. If you are on a ventilator. A ventilator is a machine that helps you breathe by moving air in and out of your lungs. After surgery. What are the risks? Risks of using CHG include: A skin reaction. Hearing loss, if CHG gets in your ears and you have a perforated eardrum. Eye injury, if CHG gets in your eyes and is not rinsed out. The CHG product catching fire. Make sure that you avoid smoking and flames after applying CHG to your skin. Do not use CHG: If you have a chlorhexidine allergy or have previously reacted to chlorhexidine. On babies younger than 63 months of age. How to use CHG solution Use CHG only as told by your health care provider, and follow the instructions on the label. Use the full amount of CHG as directed. Usually, this is one bottle. During a shower Follow these steps when using CHG solution during a shower (unless your health care provider gives you different instructions): Start the shower. Use your normal soap and shampoo to wash your face and hair. Turn off the shower or move out of the shower stream. Pour the CHG onto a clean washcloth. Do not use any type of brush or rough-edged sponge. Starting at your neck, lather your body down to your toes. Make sure you follow these instructions: If you will be having surgery, pay special attention to the part of your body where you will be having surgery. Scrub this area for at least 1 minute. Do not use CHG on your head or face. If the solution gets into your ears or eyes, rinse them well with water. Avoid your genital area. Avoid any areas of skin that have broken skin, cuts, or scrapes. Scrub your back and under your arms. Make sure to wash skin folds. Let the lather sit on your skin for 1-2 minutes or as long as told by your health care provider. Thoroughly rinse your entire body in the shower. Make sure that  all body creases and crevices are rinsed well. Dry off with a clean towel. Do not put any substances on your body afterward--such as powder, lotion, or perfume--unless you are told to do so by your health care provider. Only use lotions that are recommended by the manufacturer. Put on clean clothes or pajamas. If it is the night before your surgery, sleep in clean sheets.  During a sponge bath Follow these steps when using CHG solution during a sponge bath (unless your health care provider gives you different instructions): Use your normal soap and shampoo to wash your face and hair. Pour the CHG onto a clean washcloth. Starting at your neck, lather your body down to your toes. Make sure you follow these instructions: If you will be having  surgery, pay special attention to the part of your body where you will be having surgery. Scrub this area for at least 1 minute. Do not use CHG on your head or face. If the solution gets into your ears or eyes, rinse them well with water. Avoid your genital area. Avoid any areas of skin that have broken skin, cuts, or scrapes. Scrub your back and under your arms. Make sure to wash skin folds. Let the lather sit on your skin for 1-2 minutes or as long as told by your health care provider. Using a different clean, wet washcloth, thoroughly rinse your entire body. Make sure that all body creases and crevices are rinsed well. Dry off with a clean towel. Do not put any substances on your body afterward--such as powder, lotion, or perfume--unless you are told to do so by your health care provider. Only use lotions that are recommended by the manufacturer. Put on clean clothes or pajamas. If it is the night before your surgery, sleep in clean sheets. How to use CHG prepackaged cloths Only use CHG cloths as told by your health care provider, and follow the instructions on the label. Use the CHG cloth on clean, dry skin. Do not use the CHG cloth on your head or face  unless your health care provider tells you to. When washing with the CHG cloth: Avoid your genital area. Avoid any areas of skin that have broken skin, cuts, or scrapes. Before surgery Follow these steps when using a CHG cloth to clean before surgery (unless your health care provider gives you different instructions): Using the CHG cloth, vigorously scrub the part of your body where you will be having surgery. Scrub using a back-and-forth motion for 3 minutes. The area on your body should be completely wet with CHG when you are done scrubbing. Do not rinse. Discard the cloth and let the area air-dry. Do not put any substances on the area afterward, such as powder, lotion, or perfume. Put on clean clothes or pajamas. If it is the night before your surgery, sleep in clean sheets.  For general bathing Follow these steps when using CHG cloths for general bathing (unless your health care provider gives you different instructions). Use a separate CHG cloth for each area of your body. Make sure you wash between any folds of skin and between your fingers and toes. Wash your body in the following order, switching to a new cloth after each step: The front of your neck, shoulders, and chest. Both of your arms, under your arms, and your hands. Your stomach and groin area, avoiding the genitals. Your right leg and foot. Your left leg and foot. The back of your neck, your back, and your buttocks. Do not rinse. Discard the cloth and let the area air-dry. Do not put any substances on your body afterward--such as powder, lotion, or perfume--unless you are told to do so by your health care provider. Only use lotions that are recommended by the manufacturer. Put on clean clothes or pajamas. Contact a health care provider if: Your skin gets irritated after scrubbing. You have questions about using your solution or cloth. You swallow any chlorhexidine. Call your local poison control center ([phone] in the  [place] Get help right away if: Your eyes itch badly, or they become very red or swollen. Your skin itches badly and is red or swollen. Your hearing changes. You have trouble seeing. You have swelling or tingling in your mouth or throat. You have trouble  breathing. These symptoms may represent a serious problem that is an emergency. Do not wait to see if the symptoms will go away. Get medical help right away. Call your local emergency services (911 in the [place] Do not drive yourself to the hospital. Summary Chlorhexidine gluconate (CHG) is a germ-killing (antiseptic) solution that is used to clean the skin. Cleaning your skin with CHG may help to lower your risk for infection. You may be given CHG to use for bathing. It may be in a bottle or in a prepackaged cloth to use on your skin. Carefully follow your health care provider's instructions and the instructions on the product label. Do not use CHG if you have a chlorhexidine allergy. Contact your health care provider if your skin gets irritated after scrubbing. This information is not intended to replace advice given to you by your health care provider. Make sure you discuss any questions you have with your health care provider. Document Revised: 04/06/2022 Document Reviewed: 02/17/2021 Else[employer] Patient Education  2023 [employer] General Anesthesia, Adult General anesthesia is the use of medicine to make you fall asleep (unconscious) for a medical procedure. General anesthesia must be used for certain procedures. It is often recommended for surgery or procedures that: Last a long time. Require you to be still or in an unusual position. Are major and can cause blood loss. Affect your breathing. The medicines used for general anesthesia are called general anesthetics. During general anesthesia, these medicines are given along with medicines that: Prevent pain. Control your blood pressure. Relax your muscles. Prevent nausea and vomiting  after the procedure. Tell a health care provider about: Any allergies you have. All medicines you are taking, including vitamins, herbs, eye drops, creams, and over-the-counter medicines. Your history of any: Medical conditions you have, including: High blood pressure. Bleeding problems. Diabetes. Heart or lung conditions, such as: Heart failure. Sleep apnea. Asthma. Chronic obstructive pulmonary disease (COPD). Current or recent illnesses, such as: Upper respiratory, chest, or ear infections. Cough or fever. Tobacco or drug use, including marijuana or alcohol use. Depression or anxiety. Surgeries and types of anesthetics you have had. Problems you or family members have had with anesthetic medicines. Whether you are pregnant or may be pregnant. Whether you have any chipped or loose teeth, dentures, caps, bridgework, or issues with your mouth, swallowing, or choking. What are the risks? Your health care provider will talk with you about risks. These may include: Allergic reaction to the medicines. Lung and heart problems. Inhaling food or liquid from the stomach into the lungs (aspiration). Nerve injury. Injury to the lips, mouth, teeth, or gums. Stroke. Waking up during your procedure and being unable to move. This is rare. These problems are more likely to develop if you are having a major surgery or if you have an advanced or serious medical condition. You can prevent some of these complications by answering all of your health care provider's questions thoroughly and by following all instructions before your procedure. General anesthesia can cause side effects, including: Nausea or vomiting. A sore throat or hoarseness from the breathing tube. Wheezing or coughing. Shaking chills or feeling cold. Body aches. Sleepiness. Confusion, agitation (delirium), or anxiety. What happens before the procedure? When to stop eating and drinking Follow instructions from your health  care provider about what you may eat and drink before your procedure. If you do not follow your health care provider's instructions, your procedure may be delayed or canceled. Medicines Ask your health care provider about:  Changing or stopping your regular medicines. These include any diabetes medicines or blood thinners you take. Taking medicines such as aspirin and ibuprofen. These medicines can thin your blood. Do not take them unless your health care provider tells you to. Taking over-the-counter medicines, vitamins, herbs, and supplements. General instructions Do not use any products that contain nicotine or tobacco for at least 4 weeks before the procedure. These products include cigarettes, chewing tobacco, and vaping devices, such as e-cigarettes. If you need help quitting, ask your health care provider. If you brush your teeth on the morning of the procedure, make sure to spit out all of the water and toothpaste. If told by your health care provider, bring your sleep apnea device with you to surgery (if applicable). If you will be going home right after the procedure, plan to have a responsible adult: Take you home from the hospital or clinic. You will not be allowed to drive. Care for you for the time you are told. What happens during the procedure?  An IV will be inserted into one of your veins. You will be given one or more of the following through a face mask or IV: A sedative. This helps you relax. Anesthesia. This will: Numb certain areas of your body. Make you fall asleep for surgery. After you are unconscious, a breathing tube may be inserted down your throat to help you breathe. This will be removed before you wake up. An anesthesia provider, such as an anesthesiologist, will stay with you throughout your procedure. The anesthesia provider will: Keep you comfortable and safe by continuing to give you medicines and adjusting the amount of medicine that you get. Monitor your  blood pressure, heart rate, and oxygen levels to make sure that the anesthetics do not cause any problems. The procedure may vary among health care providers and hospitals. What happens after the procedure? Your blood pressure, temperature, heart rate, breathing rate, and blood oxygen level will be monitored until you leave the hospital or clinic. You will wake up in a recovery area. You may wake up slowly. You may be given medicine to help you with pain, nausea, or any other side effects from the anesthesia. Summary General anesthesia is the use of medicine to make you fall asleep (unconscious) for a medical procedure. Follow your health care provider's instructions about when to stop eating, drinking, or taking certain medicines before your procedure. Plan to have a responsible adult take you home from the hospital or clinic. This information is not intended to replace advice given to you by your health care provider. Make sure you discuss any questions you have with your health care provider. Document Revised: 03/05/2022 Document Reviewed: 03/05/2022 Else[employer] Patient Education  2024 [employer] General Anesthesia, Adult, Care After The following information offers guidance on how to care for yourself after your procedure. Your health care provider may also give you more specific instructions. If you have problems or questions, contact your health care provider. What can I expect after the procedure? After the procedure, it is common for people to: Have pain or discomfort at the IV site. Have nausea or vomiting. Have a sore throat or hoarseness. Have trouble concentrating. Feel cold or chills. Feel weak, sleepy, or tired (fatigue). Have soreness and body aches. These can affect parts of the body that were not involved in surgery. Follow these instructions at home: For the time period you were told by your health care provider:  Rest. Do not participate in activities where  you could  fall or become injured. Do not drive or use machinery. Do not drink alcohol. Do not take sleeping pills or medicines that cause drowsiness. Do not make important decisions or sign legal documents. Do not take care of children on your own. General instructions Drink enough fluid to keep your urine pale yellow. If you have sleep apnea, surgery and certain medicines can increase your risk for breathing problems. Follow instructions from your health care provider about wearing your sleep device: Anytime you are sleeping, including during daytime naps. While taking prescription pain medicines, sleeping medicines, or medicines that make you drowsy. Return to your normal activities as told by your health care provider. Ask your health care provider what activities are safe for you. Take over-the-counter and prescription medicines only as told by your health care provider. Do not use any products that contain nicotine or tobacco. These products include cigarettes, chewing tobacco, and vaping devices, such as e-cigarettes. These can delay incision healing after surgery. If you need help quitting, ask your health care provider. Contact a health care provider if: You have nausea or vomiting that does not get better with medicine. You vomit every time you eat or drink. You have pain that does not get better with medicine. You cannot urinate or have bloody urine. You develop a skin rash. You have a fever. Get help right away if: You have trouble breathing. You have chest pain. You vomit blood. These symptoms may be an emergency. Get help right away. Call 911. Do not wait to see if the symptoms will go away. Do not drive yourself to the hospital. Summary After the procedure, it is common to have a sore throat, hoarseness, nausea, vomiting, or to feel weak, sleepy, or fatigue. For the time period you were told by your health care provider, do not drive or use machinery. Get help right away if you  have difficulty breathing, have chest pain, or vomit blood. These symptoms may be an emergency. This information is not intended to replace advice given to you by your health care provider. Make sure you discuss any questions you have with your health care provider. Document Revised: 03/06/2022 Document Reviewed: 03/06/2022 Elsevier Patient Education  2024 [employer] Cystoscopy Cystoscopy is a procedure that is used to help diagnose and sometimes treat conditions that affect the lower urinary tract. The lower urinary tract includes the bladder and the urethra. The urethra is the tube that drains urine from the bladder. Cystoscopy is done using a thin, tube-shaped instrument with a light and camera at the end (cystoscope). The cystoscope may be hard or flexible, depending on the goal of the procedure. The cystoscope is inserted through the urethra, into the bladder. Cystoscopy may be recommended if you have: Urinary tract infections that keep coming back. Blood in the urine (hematuria). An inability to control when you urinate (urinary incontinence) or an overactive bladder. Unusual cells found in a urine sample. A blockage in the urethra, such as a urinary stone. Painful urination. An abnormality in the bladder found during an intravenous pyelogram (IVP) or CT scan. Cystoscopy may also be done to remove a sample of tissue to be examined under a microscope (biopsy). Tell a health care provider about: Any allergies you have. All medicines you are taking, including vitamins, herbs, eye drops, creams, and over-the-counter medicines. Any problems you or family members have had with anesthetic medicines. Any blood disorders you have. Any surgeries you have had. Any medical conditions you have. Whether you  are pregnant or may be pregnant. What are the risks? Generally, this is a safe procedure. However, problems may occur, including: Infection. Bleeding. Allergic reactions to  medicines. Damage to other structures or organs. What happens before the procedure? Medicines Ask your health care provider about: Changing or stopping your regular medicines. This is especially important if you are taking diabetes medicines or blood thinners. Taking medicines such as aspirin and ibuprofen. These medicines can thin your blood. Do not take these medicines unless your health care provider tells you to take them. Taking over-the-counter medicines, vitamins, herbs, and supplements. Tests You may have an exam or testing, such as: X-rays of the bladder, urethra, or kidneys. CT scan of the abdomen or pelvis. Urine tests to check for signs of infection. General instructions Follow instructions from your health care provider about eating or drinking restrictions. Ask your health care provider what steps will be taken to help prevent infection. These steps may include: Washing skin with a germ-killing soap. Taking antibiotic medicine. Plan to have a responsible adult take you home from the hospital or clinic. What happens during the procedure?  You will be given one or more of the following: A medicine to help you relax (sedative). A medicine to numb the area (local anesthetic). The area around the opening of your urethra will be cleaned. The cystoscope will be passed through your urethra into your bladder. Germ-free (sterile) fluid will flow through the cystoscope to fill your bladder. The fluid will stretch your bladder so that your health care provider can clearly examine your bladder walls. Your doctor will look at the urethra and bladder. Your doctor may take a biopsy or remove stones. The cystoscope will be removed, and your bladder will be emptied. The procedure may vary among health care providers and hospitals. What can I expect after the procedure? After the procedure, it is common to have: Some soreness or pain in your abdomen and urethra. Urinary symptoms. These  include: Mild pain or burning when you urinate. Pain should stop within a few minutes after you urinate. This may last for up to 1 week. A small amount of blood in your urine for several days. Feeling like you need to urinate but producing only a small amount of urine. Follow these instructions at home: Medicines Take over-the-counter and prescription medicines only as told by your health care provider. If you were prescribed an antibiotic medicine, take it as told by your health care provider. Do not stop taking the antibiotic even if you start to feel better. General instructions Return to your normal activities as told by your health care provider. Ask your health care provider what activities are safe for you. If you were given a sedative during the procedure, it can affect you for several hours. Do not drive or operate machinery until your health care provider says that it is safe. Watch for any blood in your urine. If the amount of blood in your urine increases, call your health care provider. Follow instructions from your health care provider about eating or drinking restrictions. If a tissue sample was removed for testing (biopsy) during your procedure, it is up to you to get your test results. Ask your health care provider, or the department that is doing the test, when your results will be ready. Drink enough fluid to keep your urine pale yellow. Keep all follow-up visits. This is important. Contact a health care provider if: You have pain that gets worse or does not get better  with medicine, especially pain when you urinate. You have trouble urinating. You have more blood in your urine. Get help right away if: You have blood clots in your urine. You have abdominal pain. You have a fever or chills. You are unable to urinate. Summary Cystoscopy is a procedure that is used to help diagnose and sometimes treat conditions that affect the lower urinary tract. Cystoscopy is done using  a thin, tube-shaped instrument with a light and camera at the end. After the procedure, it is common to have some soreness or pain in your abdomen and urethra. Watch for any blood in your urine. If the amount of blood in your urine increases, call your health care provider. If you were prescribed an antibiotic medicine, take it as told by your health care provider. Do not stop taking the antibiotic even if you start to feel better. This information is not intended to replace advice given to you by your health care provider. Make sure you discuss any questions you have with your health care provider. Document Revised: 08/20/2021 Document Reviewed: 07/19/2020 Elsevier Patient Education  2024 [employer].

## 2023-10-19 ENCOUNTER — Encounter (HOSPITAL_COMMUNITY): Payer: Self-pay

## 2023-10-19 ENCOUNTER — Encounter (HOSPITAL_COMMUNITY)
Admission: RE | Admit: 2023-10-19 | Discharge: 2023-10-19 | Disposition: A | Discharge status: Home / Self Care | Payer: Medicare Other | Source: Ambulatory Visit | Attending: Urology | Admitting: Urology

## 2023-10-19 VITALS — BP 125/59 | HR 65 | Temp 97.9°F | Resp 18 | Ht 68.0 in | Wt 259.0 lb

## 2023-10-19 DIAGNOSIS — R339 Retention of urine, unspecified: Secondary | ICD-10-CM | POA: Insufficient documentation

## 2023-10-19 DIAGNOSIS — Z01812 Encounter for preprocedural laboratory examination: Secondary | ICD-10-CM | POA: Diagnosis not present

## 2023-10-19 DIAGNOSIS — E0822 Diabetes mellitus due to underlying condition with diabetic chronic kidney disease: Secondary | ICD-10-CM | POA: Diagnosis not present

## 2023-10-19 DIAGNOSIS — Z01818 Encounter for other preprocedural examination: Secondary | ICD-10-CM

## 2023-10-19 LAB — BASIC METABOLIC PANEL
Anion gap: 10 (ref 5–15)
BUN: 18 mg/dL (ref 8–23)
CO2: 24 mmol/L (ref 22–32)
Calcium: 9.3 mg/dL (ref 8.9–10.3)
Chloride: 102 mmol/L (ref 98–111)
Creatinine, Ser: 0.83 mg/dL (ref 0.61–1.24)
GFR, Estimated: >60 mL/min (ref >60)
Glucose, Bld: 133 mg/dL — ABNORMAL HIGH (ref 70–99)
Potassium: 4.4 mmol/L (ref 3.5–5.1)
Sodium: 136 mmol/L (ref 135–145)

## 2023-10-19 LAB — HEMOGLOBIN A1C
Hgb A1c MFr Bld: 7.4 % — ABNORMAL HIGH (ref 4.8–5.6)
Mean Plasma Glucose: 165.68 mg/dL

## 2023-10-20 DIAGNOSIS — Z23 Encounter for immunization: Secondary | ICD-10-CM | POA: Diagnosis not present

## 2023-10-21 ENCOUNTER — Other Ambulatory Visit: Payer: Self-pay

## 2023-10-21 ENCOUNTER — Ambulatory Visit (HOSPITAL_COMMUNITY): Payer: Medicare Other | Admitting: Certified Registered"

## 2023-10-21 ENCOUNTER — Ambulatory Visit (HOSPITAL_COMMUNITY)
Admission: RE | Admit: 2023-10-21 | Discharge: 2023-10-21 | Disposition: A | Discharge status: Home / Self Care | Payer: Medicare Other | Attending: Urology | Admitting: Urology

## 2023-10-21 ENCOUNTER — Encounter (HOSPITAL_COMMUNITY): Payer: Self-pay | Admitting: Urology

## 2023-10-21 ENCOUNTER — Encounter (HOSPITAL_COMMUNITY)
Admission: RE | Disposition: A | Discharge status: Home / Self Care | Payer: Self-pay | Source: Home / Self Care | Attending: Urology

## 2023-10-21 ENCOUNTER — Ambulatory Visit (HOSPITAL_BASED_OUTPATIENT_CLINIC_OR_DEPARTMENT_OTHER): Payer: Medicare Other | Admitting: Certified Registered"

## 2023-10-21 DIAGNOSIS — Z7985 Long-term (current) use of injectable non-insulin antidiabetic drugs: Secondary | ICD-10-CM | POA: Insufficient documentation

## 2023-10-21 DIAGNOSIS — K219 Gastro-esophageal reflux disease without esophagitis: Secondary | ICD-10-CM | POA: Insufficient documentation

## 2023-10-21 DIAGNOSIS — N319 Neuromuscular dysfunction of bladder, unspecified: Secondary | ICD-10-CM

## 2023-10-21 DIAGNOSIS — E119 Type 2 diabetes mellitus without complications: Secondary | ICD-10-CM | POA: Insufficient documentation

## 2023-10-21 DIAGNOSIS — I10 Essential (primary) hypertension: Secondary | ICD-10-CM | POA: Diagnosis not present

## 2023-10-21 HISTORY — PX: CYSTOSCOPY WITH INJECTION: SHX1424

## 2023-10-21 LAB — GLUCOSE, CAPILLARY: Glucose-Capillary: 163 mg/dL — ABNORMAL HIGH (ref 70–99)

## 2023-10-21 SURGERY — CYSTOSCOPY, WITH INJECTION OF BLADDER NECK OR BLADDER WALL
Anesthesia: General | Site: Bladder

## 2023-10-21 MED ORDER — FENTANYL CITRATE PF 50 MCG/ML IJ SOSY: 25.0000 ug | PREFILLED_SYRINGE | INTRAMUSCULAR | Status: DC | PRN

## 2023-10-21 MED ORDER — ONDANSETRON HCL 4 MG/2ML IJ SOLN
INTRAMUSCULAR | Status: AC
  Filled 2023-10-21: qty 2

## 2023-10-21 MED ORDER — DEXAMETHASONE SODIUM PHOSPHATE 10 MG/ML IJ SOLN
INTRAMUSCULAR | Status: DC | PRN
  Administered 2023-10-21: 8 mg via INTRAVENOUS

## 2023-10-21 MED ORDER — SODIUM CHLORIDE 0.9 % IR SOLN
Status: DC | PRN
Start: 2023-10-21 — End: 2023-10-21
  Administered 2023-10-21: 3000 mL

## 2023-10-21 MED ORDER — CEFAZOLIN SODIUM-DEXTROSE 2-4 GM/100ML-% IV SOLN
2.0000 g | INTRAVENOUS | Status: AC
  Administered 2023-10-21: 2 g via INTRAVENOUS

## 2023-10-21 MED ORDER — LIDOCAINE HCL (CARDIAC) PF 100 MG/5ML IV SOSY
PREFILLED_SYRINGE | INTRAVENOUS | Status: DC | PRN
  Administered 2023-10-21: 60 mg via INTRAVENOUS

## 2023-10-21 MED ORDER — LIDOCAINE HCL (PF) 2 % IJ SOLN
INTRAMUSCULAR | Status: AC
  Filled 2023-10-21: qty 5

## 2023-10-21 MED ORDER — CEFAZOLIN SODIUM-DEXTROSE 2-4 GM/100ML-% IV SOLN
INTRAVENOUS | Status: AC
  Filled 2023-10-21: qty 100

## 2023-10-21 MED ORDER — SODIUM CHLORIDE (PF) 0.9 % IJ SOLN
INTRAMUSCULAR | Status: AC
  Filled 2023-10-21: qty 20

## 2023-10-21 MED ORDER — FENTANYL CITRATE (PF) 100 MCG/2ML IJ SOLN
INTRAMUSCULAR | Status: AC
  Filled 2023-10-21: qty 2

## 2023-10-21 MED ORDER — ONDANSETRON HCL 4 MG/2ML IJ SOLN
INTRAMUSCULAR | Status: DC | PRN
  Administered 2023-10-21: 4 mg via INTRAVENOUS

## 2023-10-21 MED ORDER — CHLORHEXIDINE GLUCONATE 0.12 % MT SOLN
15.0000 mL | Freq: Once | OROMUCOSAL | Status: AC
  Administered 2023-10-21: 15 mL via OROMUCOSAL

## 2023-10-21 MED ORDER — ONABOTULINUMTOXINA 100 UNITS IJ SOLR
100.0000 [IU] | Freq: Once | INTRAMUSCULAR | Status: DC
  Filled 2023-10-21: qty 100

## 2023-10-21 MED ORDER — ONDANSETRON HCL 4 MG/2ML IJ SOLN: 4.0000 mg | Freq: Once | INTRAMUSCULAR | Status: DC | PRN

## 2023-10-21 MED ORDER — PROPOFOL 10 MG/ML IV BOLUS
INTRAVENOUS | Status: DC | PRN
  Administered 2023-10-21: 250 mg via INTRAVENOUS

## 2023-10-21 MED ORDER — FENTANYL CITRATE (PF) 250 MCG/5ML IJ SOLN
INTRAMUSCULAR | Status: DC | PRN
  Administered 2023-10-21 (×4): 25 ug via INTRAVENOUS

## 2023-10-21 MED ORDER — WATER FOR IRRIGATION, STERILE IR SOLN
Status: DC | PRN
  Administered 2023-10-21: 500 mL

## 2023-10-21 MED ORDER — PROPOFOL 10 MG/ML IV BOLUS
INTRAVENOUS | Status: AC
  Filled 2023-10-21: qty 20

## 2023-10-21 MED ORDER — ONABOTULINUMTOXINA 100 UNITS IJ SOLR
INTRAMUSCULAR | Status: DC | PRN
  Administered 2023-10-21: 200 [IU] via INTRAMUSCULAR

## 2023-10-21 MED ORDER — OXYCODONE HCL 5 MG/5ML PO SOLN: 5.0000 mg | Freq: Once | ORAL | Status: DC | PRN

## 2023-10-21 MED ORDER — LACTATED RINGERS IV SOLN: INTRAVENOUS | Status: DC | PRN

## 2023-10-21 MED ORDER — OXYCODONE HCL 5 MG PO TABS: 5.0000 mg | ORAL_TABLET | Freq: Once | ORAL | Status: DC | PRN

## 2023-10-21 MED ORDER — DEXAMETHASONE SODIUM PHOSPHATE 10 MG/ML IJ SOLN
INTRAMUSCULAR | Status: AC
  Filled 2023-10-21: qty 1

## 2023-10-21 MED ORDER — SODIUM CHLORIDE (PF) 0.9 % IJ SOLN
INTRAMUSCULAR | Status: DC | PRN
  Administered 2023-10-21: 20 mL

## 2023-10-21 MED ORDER — PHENYLEPHRINE 80 MCG/ML (10ML) SYRINGE FOR IV PUSH (FOR BLOOD PRESSURE SUPPORT)
PREFILLED_SYRINGE | INTRAVENOUS | Status: DC | PRN
Start: 2023-10-21 — End: 2023-10-21
  Administered 2023-10-21: 80 ug via INTRAVENOUS

## 2023-10-21 SURGICAL SUPPLY — 25 items
BAG DRAIN URO TABLE W/ADPT NS (BAG) ×1 IMPLANT
BAG DRN 8 ADPR NS SKTRN CSTL (BAG) ×1
BAG DRN RND TRDRP ANRFLXCHMBR (UROLOGICAL SUPPLIES) ×1
BAG HAMPER (MISCELLANEOUS) ×1 IMPLANT
BAG URINE DRAIN 2000ML AR STRL (UROLOGICAL SUPPLIES) IMPLANT
CATH FOLEY 2WAY SLVR 5CC 20FR (CATHETERS) IMPLANT
CLOTH BEACON ORANGE TIMEOUT ST (SAFETY) ×1 IMPLANT
GLOVE BIO SURGEON STRL SZ8 (GLOVE) ×1 IMPLANT
GLOVE BIOGEL PI IND STRL 7.0 (GLOVE) ×2 IMPLANT
GOWN STRL REUS W/TWL LRG LVL3 (GOWN DISPOSABLE) ×1 IMPLANT
GOWN STRL REUS W/TWL XL LVL3 (GOWN DISPOSABLE) ×1 IMPLANT
KIT TURNOVER CYSTO (KITS) ×1 IMPLANT
MANIFOLD NEPTUNE II (INSTRUMENTS) ×1 IMPLANT
NDL ASPIRATION 22 (NEEDLE) ×1 IMPLANT
NDL HYPO 18GX1.5 BLUNT FILL (NEEDLE) ×1 IMPLANT
NEEDLE ASPIRATION 22 (NEEDLE) ×1
NEEDLE HYPO 18GX1.5 BLUNT FILL (NEEDLE) ×1
PACK CYSTO (CUSTOM PROCEDURE TRAY) ×1 IMPLANT
PAD ARMBOARD 7.5X6 YLW CONV (MISCELLANEOUS) ×1 IMPLANT
POSITIONER HEAD 8X9X4 ADT (SOFTGOODS) ×1 IMPLANT
SYR 30ML LL (SYRINGE) ×1 IMPLANT
SYR CONTROL 10ML LL (SYRINGE) ×1 IMPLANT
TOWEL OR 17X26 4PK STRL BLUE (TOWEL DISPOSABLE) ×1 IMPLANT
WATER STERILE IRR 3000ML UROMA (IV SOLUTION) ×1 IMPLANT
WATER STERILE IRR 500ML POUR (IV SOLUTION) ×1 IMPLANT

## 2023-10-21 NOTE — Transfer of Care (Signed)
Immediate Anesthesia Transfer of Care Note  Patient: Marc Burns  Procedure(s) Performed: CYSTOSCOPY WITH INJECTION- Botox (Bladder)  Patient Location: PACU  Anesthesia Type:General  Level of Consciousness: awake and patient cooperative  Airway & Oxygen Therapy: Patient Spontanous Breathing and Patient connected to face mask oxygen  Post-op Assessment: Report given to RN and Post -op Vital signs reviewed and stable  Post vital signs: Reviewed and stable  Last Vitals:  Vitals Value Taken Time  BP 141/66 10/21/23 1308  Temp 36.5 C 10/21/23 1308  Pulse 79 10/21/23 1310  Resp 17 10/21/23 1310  SpO2 97 % 10/21/23 1310  Vitals shown include unfiled device data.  Last Pain:  Vitals:   10/21/23 0841  TempSrc: Oral  PainSc: 0-No pain         Complications: No notable events documented.

## 2023-10-21 NOTE — Anesthesia Procedure Notes (Signed)
Procedure Name: LMA Insertion Date/Time: 10/21/2023 12:18 PM  Performed by: Oletha Cruel, CRNAPre-anesthesia Checklist: Patient identified, Emergency Drugs available, Suction available and Patient being monitored Patient Re-evaluated:Patient Re-evaluated prior to induction Oxygen Delivery Method: Circle system utilized Preoxygenation: Pre-oxygenation with 100% oxygen Induction Type: IV induction Ventilation: Mask ventilation without difficulty LMA: LMA inserted LMA Size: 4.0 Number of attempts: 1 Placement Confirmation: positive ETCO2, CO2 detector and breath sounds checked- equal and bilateral Tube secured with: Tape Dental Injury: Teeth and Oropharynx as per pre-operative assessment  Comments: Atraumatic intubation. Lips and teeth remain in preoperative condition.

## 2023-10-21 NOTE — Op Note (Signed)
Preoperative diagnosis: neurogenic bladder  Postoperative diagnosis: same  Procedure: 1 cystoscopy 2. Intravesical botox injection 200 units  Attending: Wilkie Aye  Anesthesia: General  Estimated blood loss: Minimal  Drains: 20 french foley  Specimens: none  Antibiotics: ancef  Findings:  Ureteral orifices in normal anatomic location.  No masses/lesions in the bladder  Indications: Patient is a 77 year old male with a history of neurogenic bladder and urinary leakage refractory to anticholinergic therapy.  After discussing treatment options, they decided proceed with intravesical botox injection.  Procedure in detail: The patient was brought to the operating room and a brief timeout was done to ensure correct patient, correct procedure, correct site.  General anesthesia was administered patient was placed in dorsal lithotomy position.  Their genitalia was then prepped and draped in usual sterile fashion.  A rigid 22 French cystoscope was passed in the urethra and the bladder.  Bladder was inspected and we noted no masses or lesions.  the ureteral orifices were in the normal orthotopic locations. Using a deflux injection needle we proceed to inject 200 units in a grid pattern between the ureteral orifices starting at the trigone to the mid posterior wall. We injected a total of 20 sites with 10 units per injection. The bladder was then drained and this concluded the procedure which was well tolerated by patient.  Complications: None  Condition: Stable, extubated, transferred to PACU  Plan: Patient is to be discharge home. They will followup in 1 month

## 2023-10-21 NOTE — Anesthesia Preprocedure Evaluation (Signed)
Anesthesia Evaluation  Patient identified by MRN, date of birth, ID band Patient awake    Reviewed: Allergy & Precautions, H&P , NPO status , Patient's Chart, lab work & pertinent test results, reviewed documented beta blocker date and time   History of Anesthesia Complications (+) DIFFICULT AIRWAY and history of anesthetic complications  Airway Mallampati: II  TM Distance: >3 FB Neck ROM: full    Dental no notable dental hx. (+)  Many missing:   Pulmonary neg pulmonary ROS   Pulmonary exam normal breath sounds clear to auscultation       Cardiovascular Exercise Tolerance: Good hypertension, negative cardio ROS Normal cardiovascular exam Rhythm:regular Rate:Normal     Neuro/Psych negative neurological ROS  negative psych ROS   GI/Hepatic negative GI ROS, Neg liver ROS,GERD  ,,  Endo/Other  negative endocrine ROSdiabetes    Renal/GU negative Renal ROS  negative genitourinary   Musculoskeletal negative musculoskeletal ROS (+) Arthritis ,    Abdominal   Peds  Hematology negative hematology ROS (+)   Anesthesia Other Findings   Reproductive/Obstetrics negative OB ROS                              Anesthesia Physical Anesthesia Plan  ASA: 2  Anesthesia Plan: General   Post-op Pain Management:    Induction: Intravenous  PONV Risk Score and Plan:   Airway Management Planned: LMA  Additional Equipment:   Intra-op Plan:   Post-operative Plan:   Informed Consent: I have reviewed the patients History and Physical, chart, labs and discussed the procedure including the risks, benefits and alternatives for the proposed anesthesia with the patient or authorized representative who has indicated his/her understanding and acceptance.     Dental Advisory Given  Plan Discussed with: CRNA  Anesthesia Plan Comments:          Anesthesia Quick Evaluation

## 2023-10-21 NOTE — H&P (Signed)
HPI: Marc Burns is a 77yo here for cystoscopy with intravesical botox. He has rare incontinence around his foley. He has intervesical botox 200 units 5 months ago. He is having to change his foley monthly. NO hematuria or pelvic pain. No other complaints today     PMH:     Past Medical History:  Diagnosis Date   Arthritis     Bladder neck contracture     Complication of anesthesia      Difficult intubation   Diabetes mellitus without complication (HCC)     Difficult intubation     Foley catheter in place     GERD (gastroesophageal reflux disease)     History of gastric ulcer      2007   History of kidney stones     History of prostate cancer      2004--  S/P PROSTATECTOMY   Hypertension     Urinary incontinence     Wears glasses    [] Expand by Default        Surgical History:      Past Surgical History:  Procedure Laterality Date   BALLOON DILATION N/A 09/12/2013    Procedure: CYSTO BALLOON DILATION/RETROGRADE URETHROGRAM;  Surgeon: Martina Sinner, MD;  Location: [facility];  Service: Urology;  Laterality: N/A;   BOTOX INJECTION N/A 01/08/2022    Procedure: BOTOX INJECTION;  Surgeon: Malen Gauze, MD;  Location: [facility];  Service: Urology;  Laterality: N/A;   BOTOX INJECTION N/A 08/13/2022    Procedure: BOTOX INJECTION;  Surgeon: Malen Gauze, MD;  Location: [facility];  Service: Urology;  Laterality: N/A;   BOTOX INJECTION N/A 04/15/2023    Procedure: BOTOX INJECTION;  Surgeon: Malen Gauze, MD;  Location: [facility];  Service: Urology;  Laterality: N/A;   CYSTO/  URETHRAL DILATION/ BLADDER BX/ BILATERAL RETROGRADE PYELOGRAM   03-03-2010   CYSTO/ BALLOON DILATION AND VAPORIZATION BLADDER NECK & PROSTATIC FOSSA   04-28-2010   CYSTO/ BALLOON DILATION BLADDER NECK CONTRACTURE   07-23-2011;   11-17-2010;  08-08-2010   CYSTOSCOPY N/A 01/08/2022    Procedure: CYSTOSCOPY WITH INTRAVESICAL BOTOX 100 UNITS;  Surgeon: Malen Gauze, MD;  Location: [facility];  Service: Urology;  Laterality: N/A;   CYSTOSCOPY N/A 04/15/2023    Procedure: CYSTOSCOPY WITH FULGURATION OF BLEEDING;  Surgeon: Malen Gauze, MD;  Location: [facility];  Service: Urology;  Laterality: N/A;   CYSTOSCOPY WITH DIRECT VISION INTERNAL URETHROTOMY N/A 04/24/2021    Procedure: CYSTOSCOPY WITH DIRECT VISION INTERNAL URETHROTOMY;  Surgeon: Malen Gauze, MD;  Location: [facility];  Service: Urology;  Laterality: N/A;   CYSTOSCOPY WITH INJECTION N/A 08/13/2022    Procedure: CYSTOSCOPY WITH INJECTION;  Surgeon: Malen Gauze, MD;  Location: [facility];  Service: Urology;  Laterality: N/A;   FULGURATION OF LESION   04/24/2021    Procedure: FULGURATION OF BLADDER NECK;  Surgeon: Malen Gauze, MD;  Location: [facility];  Service: Urology;;   MITOMYCIN C INJECTION   04/24/2021    Procedure: MITOMYCIN C INJECTION ANTERIOR BLADDER WALL;  Surgeon: Malen Gauze, MD;  Location: [facility];  Service: Urology;;   PROSTATECTOMY   2004    RADICAL   TONSILLECTOMY   AS CHILD   TRANSURETHRAL INCISION AND RESECTION OF BLADDER NECK CONTRACTURE   09-01-2010   UMBILICAL HERNIA REPAIR              Home Medications:  Allergies as of 09/01/2023  Reactions    Sulfa Antibiotics Itching, Rash            Medication List           Accurate as of September 01, 2023  3:27 PM. If you have any questions, ask your nurse or doctor.              amLODipine-benazepril 5-20 MG capsule Commonly known as: LOTREL Take 1 capsule by mouth every morning.    aspirin EC 81 MG tablet Take 81 mg by mouth daily. Swallow whole.    CENTRUM SILVER ADULT 50+ PO Take 1 tablet by mouth daily.    empagliflozin 25 MG Tabs tablet Commonly known as: JARDIANCE Take 25 mg by mouth daily.    furosemide 20 MG tablet Commonly known as: LASIX Take 20 mg by mouth daily.    gabapentin 600 MG tablet Commonly known as: NEURONTIN Take 600 mg by mouth 3 (three) times daily.    ibuprofen 200 MG  tablet Commonly known as: ADVIL Take 400 mg by mouth every 6 (six) hours as needed for moderate pain or headache.    metoprolol tartrate 25 MG tablet Commonly known as: LOPRESSOR Take 25 mg by mouth 2 (two) times daily.    omeprazole 20 MG capsule Commonly known as: PRILOSEC Take 20 mg by mouth daily.    Ozempic (0.25 or 0.5 MG/DOSE) 2 MG/1.5ML Sopn Generic drug: Semaglutide(0.25 or 0.5MG /DOS) Inject 0.5 mg into the skin every Sunday.    primidone 50 MG tablet Commonly known as: MYSOLINE Take 1 tablet (50 mg total) by mouth 2 (two) times daily.    rosuvastatin 10 MG tablet Commonly known as: CRESTOR Take 10 mg by mouth at bedtime.    Vitamin D3 50 MCG (2000 UT) capsule Take 2,000 Units by mouth 2 (two) times daily.             Allergies:  Allergies      Allergies  Allergen Reactions   Sulfa Antibiotics Itching and Rash        Family History:      Family History  Problem Relation Age of Onset   Heart attack Mother     Heart attack Father            Social History:  reports that he has never smoked. He has never used smokeless tobacco. He reports current alcohol use of about 6.0 standard drinks of alcohol per week. He reports that he does not use drugs.   ROS: All other review of systems were reviewed and are negative except what is noted above in HPI   Physical Exam: BP 130/66   Pulse 60   Constitutional:  Alert and oriented, No acute distress. HEENT: [place] AT, moist mucus membranes.  Trachea midline, no masses. Cardiovascular: No clubbing, cyanosis, or edema. Respiratory: Normal respiratory effort, no increased work of breathing. GI: Abdomen is soft, nontender, nondistended, no abdominal masses GU: No CVA tenderness.  Lymph: No cervical or inguinal lymphadenopathy. Skin: No rashes, bruises or suspicious lesions. Neurologic: Grossly intact, no focal deficits, moving all 4 extremities. Psychiatric: Normal mood and affect.   Laboratory Data: Recent Labs        Lab Results  Component Value Date    WBC 14.0 (H) 09/09/2022    HGB 14.7 09/09/2022    HCT 45.6 09/09/2022    MCV 92.1 09/09/2022    PLT 33 (L) 09/09/2022        Recent Labs  Lab Results  Component Value Date    CREATININE 0.91 03/02/2023        Recent Labs  No results found for: "PSA"     Recent Labs  No results found for: "TESTOSTERONE"     Recent Labs       Lab Results  Component Value Date    HGBA1C 7.1 (H) 08/11/2022        Urinalysis Labs (Brief)          Component Value Date/Time    COLORURINE YELLOW 02/26/2010 0930    APPEARANCEUR CLOUDY (A) 02/26/2010 0930    LABSPEC 1.019 02/26/2010 0930    PHURINE 5.5 02/26/2010 0930    GLUCOSEU NEGATIVE 02/26/2010 0930    HGBUR LARGE (A) 02/26/2010 0930    BILIRUBINUR NEGATIVE 02/26/2010 0930    KETONESUR NEGATIVE 02/26/2010 0930    PROTEINUR 30 (A) 02/26/2010 0930    UROBILINOGEN 0.2 02/26/2010 0930    NITRITE NEGATIVE 02/26/2010 0930    LEUKOCYTESUR SMALL (A) 02/26/2010 0930        Recent Labs       Lab Results  Component Value Date    BACTERIA FEW (A) 02/26/2010        Pertinent Imaging:   No results found for this or any previous visit.   No results found for this or any previous visit.   No results found for this or any previous visit.   No results found for this or any previous visit.   Results for orders placed during the hospital encounter of 07/20/17   US Renal   Narrative CLINICAL DATA:  77 year old male with urinary retention. Self catheterization. Initial encounter.   EXAM: RENAL / URINARY TRACT ULTRASOUND COMPLETE   COMPARISON:  None.   FINDINGS: Right Kidney:   Length: 11.3 cm. Echogenicity within normal limits. No mass or hydronephrosis visualized.   Left Kidney:   Length: 11.8 cm. Echogenicity within normal limits. No mass or hydronephrosis visualized.   Bladder:   Decompressed and not adequately assessed   IMPRESSION: Kidneys unremarkable    Bladder decompressed and not assessed     Electronically Signed By: Lacy Duverney M.D. On: 07/20/2017 12:25   No valid procedures specified. No results found for this or any previous visit.   No results found for this or any previous visit.     Assessment & Plan:     1. Neurogenic bladder The risks/benefits/alternatives to intravesical botox was explained to the patient and he understands and wishes to proceed with surgery

## 2023-10-22 ENCOUNTER — Encounter (HOSPITAL_COMMUNITY): Payer: Self-pay | Admitting: Urology

## 2023-10-25 DIAGNOSIS — E1169 Type 2 diabetes mellitus with other specified complication: Secondary | ICD-10-CM | POA: Diagnosis not present

## 2023-10-26 NOTE — Anesthesia Postprocedure Evaluation (Signed)
Anesthesia Post Note  Patient: Marc Burns  Procedure(s) Performed: CYSTOSCOPY WITH INJECTION- Botox (Bladder)  Patient location during evaluation: Phase II Anesthesia Type: General Level of consciousness: awake Pain management: pain level controlled Vital Signs Assessment: post-procedure vital signs reviewed and stable Respiratory status: spontaneous breathing and respiratory function stable Cardiovascular status: blood pressure returned to baseline and stable Postop Assessment: no headache and no apparent nausea or vomiting Anesthetic complications: no Comments: Late entry   No notable events documented.   Last Vitals:  Vitals:   10/21/23 1330 10/21/23 1338  BP: 138/61 (!) 143/61  Pulse: 75 72  Resp: 17 16  Temp: 36.5 C 36.5 C  SpO2: 96% 99%    Last Pain:  Vitals:   10/21/23 1338  TempSrc: Axillary  PainSc: 0-No pain                 Windell [place]

## 2023-10-29 DIAGNOSIS — Z978 Presence of other specified devices: Secondary | ICD-10-CM | POA: Insufficient documentation

## 2023-10-29 DIAGNOSIS — R32 Unspecified urinary incontinence: Secondary | ICD-10-CM | POA: Insufficient documentation

## 2023-10-29 NOTE — Progress Notes (Unsigned)
Name: Marc Burns DOB: 04/18/1946 MRN: [id number]  Diagnoses: Post-operative state  HPI: [name] presents post-operatively. GU History: 1. Prostate cancer. Underwent prostatectomy in 2004. 2. Neurogenic bladder with urinary retention and incontinence.  - Managed with chronic indwelling Foley catheter. - Leakage refractory to anticholinergic therapy; improved with intravesical Botox previously. 3. Bladder neck contracture. 4. Kidney stones.  Today He presents s/p the following procedures by Dr. Ronne Binning on 10/21/2023:  Preoperative diagnosis: neurogenic bladder   Postoperative diagnosis: same   Procedure:  1. cystoscopy 2. Intravesical botox injection 200 units  Postop course: He {Actions; denies-reports:120008} improvement of his urinary leakage since the procedure.  He reports the catheter is draining ***.  He {Actions; denies-reports:120008} gross hematuria.  He {Actions; denies-reports:120008} acute flank pain, abdominal pain, fevers, nausea, or vomiting.   Fall Screening: Do you usually have a device to assist in your mobility? {yes/no:20286} ***cane / ***walker / ***wheelchair   Medications: Current Outpatient Medications  Medication Sig Dispense Refill   amLODipine-benazepril (LOTREL) 5-20 MG per capsule Take 1 capsule by mouth every morning.     aspirin EC 81 MG tablet Take 81 mg by mouth daily. Swallow whole.     cholecalciferol (VITAMIN D3) 25 MCG (1000 UNIT) tablet Take 1,000 Units by mouth 2 (two) times daily.     ciprofloxacin (CIPRO) 250 MG tablet Take 1 tablet (250 mg total) by mouth See admin instructions. Take twice daily as needed for 5 days for UTI (Patient not taking: Reported on 10/05/2023) 10 tablet 5   empagliflozin (JARDIANCE) 25 MG TABS tablet Take 25 mg by mouth daily.     furosemide (LASIX) 20 MG tablet Take 20 mg by mouth daily.     gabapentin (NEURONTIN) 600 MG tablet Take 600 mg by mouth 3 (three) times daily.     ibuprofen  (ADVIL) 200 MG tablet Take 400 mg by mouth every 6 (six) hours as needed for moderate pain or headache.     metoprolol tartrate (LOPRESSOR) 25 MG tablet Take 25 mg by mouth 2 (two) times daily.     Multiple Vitamins-Minerals (CENTRUM SILVER ADULT 50+ PO) Take 1 tablet by mouth daily.     omeprazole (PRILOSEC) 20 MG capsule Take 20 mg by mouth daily.     primidone (MYSOLINE) 50 MG tablet Take 1 tablet (50 mg total) by mouth 2 (two) times daily. 180 tablet 3   rosuvastatin (CRESTOR) 10 MG tablet Take 10 mg by mouth at bedtime.     Semaglutide, 1 MG/DOSE, (OZEMPIC, 1 MG/DOSE,) 2 MG/1.5ML SOPN Inject 1 mg into the skin every Sunday.     No current facility-administered medications for this visit.    Allergies: Allergies  Allergen Reactions   Sulfa Antibiotics Itching and Rash    Past Medical History:  Diagnosis Date   Arthritis    Bladder neck contracture    Complication of anesthesia    Difficult intubation   Diabetes mellitus without complication (HCC)    Difficult intubation    Foley catheter in place    GERD (gastroesophageal reflux disease)    History of gastric ulcer    2007   History of kidney stones    History of prostate cancer    2004--  S/P PROSTATECTOMY   Hypertension    Urinary incontinence    Wears glasses    Past Surgical History:  Procedure Laterality Date   BALLOON DILATION N/A 09/12/2013   Procedure: CYSTO BALLOON DILATION/RETROGRADE URETHROGRAM;  Surgeon: Martina Sinner, MD;  Location: [facility];  Service: Urology;  Laterality: N/A;   BOTOX INJECTION N/A 01/08/2022   Procedure: BOTOX INJECTION;  Surgeon: Malen Gauze, MD;  Location: [facility];  Service: Urology;  Laterality: N/A;   BOTOX INJECTION N/A 08/13/2022   Procedure: BOTOX INJECTION;  Surgeon: Malen Gauze, MD;  Location: [facility];  Service: Urology;  Laterality: N/A;   BOTOX INJECTION N/A 04/15/2023   Procedure: BOTOX INJECTION;  Surgeon: Malen Gauze, MD;   Location: [facility];  Service: Urology;  Laterality: N/A;   CYSTO/  URETHRAL DILATION/ BLADDER BX/ BILATERAL RETROGRADE PYELOGRAM  03-03-2010   CYSTO/ BALLOON DILATION AND VAPORIZATION BLADDER NECK & PROSTATIC FOSSA  04-28-2010   CYSTO/ BALLOON DILATION BLADDER NECK CONTRACTURE  07-23-2011;   11-17-2010;  08-08-2010   CYSTOSCOPY N/A 01/08/2022   Procedure: CYSTOSCOPY WITH INTRAVESICAL BOTOX 100 UNITS;  Surgeon: Malen Gauze, MD;  Location: [facility];  Service: Urology;  Laterality: N/A;   CYSTOSCOPY N/A 04/15/2023   Procedure: CYSTOSCOPY WITH FULGURATION OF BLEEDING;  Surgeon: Malen Gauze, MD;  Location: [facility];  Service: Urology;  Laterality: N/A;   CYSTOSCOPY WITH DIRECT VISION INTERNAL URETHROTOMY N/A 04/24/2021   Procedure: CYSTOSCOPY WITH DIRECT VISION INTERNAL URETHROTOMY;  Surgeon: Malen Gauze, MD;  Location: [facility];  Service: Urology;  Laterality: N/A;   CYSTOSCOPY WITH INJECTION N/A 08/13/2022   Procedure: CYSTOSCOPY WITH INJECTION;  Surgeon: Malen Gauze, MD;  Location: [facility];  Service: Urology;  Laterality: N/A;   CYSTOSCOPY WITH INJECTION N/A 10/21/2023   Procedure: CYSTOSCOPY WITH INJECTION- Botox;  Surgeon: Malen Gauze, MD;  Location: [facility];  Service: Urology;  Laterality: N/A;   FULGURATION OF LESION  04/24/2021   Procedure: FULGURATION OF BLADDER NECK;  Surgeon: Malen Gauze, MD;  Location: [facility];  Service: Urology;;   MITOMYCIN C INJECTION  04/24/2021   Procedure: MITOMYCIN C INJECTION ANTERIOR BLADDER WALL;  Surgeon: Malen Gauze, MD;  Location: [facility];  Service: Urology;;   PROSTATECTOMY  2004   RADICAL   TONSILLECTOMY  AS CHILD   TRANSURETHRAL INCISION AND RESECTION OF BLADDER NECK CONTRACTURE  09-01-2010   UMBILICAL HERNIA REPAIR     Family History  Problem Relation Age of Onset   Heart attack Mother    Heart attack Father    Social History   Socioeconomic History   Marital status: Married    Spouse name: Chyrl Civatte   Number of  children: Not on file   Years of education: Not on file   Highest education level: Not on file  Occupational History   Not on file  Tobacco Use   Smoking status: Never   Smokeless tobacco: Never  Vaping Use   Vaping status: Never Used  Substance and Sexual Activity   Alcohol use: Yes    Alcohol/week: 6.0 standard drinks of alcohol    Types: 6 Cans of beer per week   Drug use: No   Sexual activity: Not Currently  Other Topics Concern   Not on file  Social History Narrative   Not on file   Social Determinants of Health   Financial Resource Strain: Not on file  Food Insecurity: Not on file  Transportation Needs: Not on file  Physical Activity: Not on file  Stress: Not on file  Social Connections: Not on file  Intimate Partner Violence: Not on file    SUBJECTIVE  Review of Systems*** Constitutional: Patient denies any unintentional weight loss or change in strength  lntegumentary: Patient denies any rashes or pruritus Cardiovascular: Patient denies chest pain or syncope Respiratory: Patient denies shortness of breath Gastrointestinal: Patient ***denies nausea, vomiting, constipation, or diarrhea Musculoskeletal: Patient denies muscle cramps or weakness Neurologic: Patient denies convulsions or seizures Allergic/Immunologic: Patient denies recent allergic reaction(s) Hematologic/Lymphatic: Patient denies bleeding tendencies Endocrine: Patient denies heat/cold intolerance  GU: As per HPI.  OBJECTIVE There were no vitals filed for this visit. There is no height or weight on file to calculate BMI.  Physical Examination*** Constitutional: No obvious distress; patient is non-toxic appearing  Cardiovascular: No visible lower extremity edema.  Respiratory: The patient does not have audible wheezing/stridor; respirations do not appear labored  Gastrointestinal: Abdomen non-distended Musculoskeletal: Normal ROM of UEs  Skin: No obvious rashes/open sores  Neurologic: CN  2-12 grossly intact Psychiatric: Answered questions appropriately with normal affect  Hematologic/Lymphatic/Immunologic: No obvious bruises or sites of spontaneous bleeding  ***GU: Catheter draining ***clear yellow urine.  UA: ***negative *** WBC/hpf, *** RBC/hpf, bacteria (***) PVR: *** ml  ASSESSMENT Neurogenic bladder *** We reviewed the operative procedures and findings.  Pre-operative symptoms are *** since the procedure.   ***voiding trial?  Will plan for follow up in *** months or sooner if needed. Pt verbalized understanding and agreement. All questions were answered.  PLAN Advised the following: Foley catheter ***discontinued. ***No follow-ups on file.  ***No global period (charge regular E&M) - Cryotherapy - Cystoscopy w/ bladder Botox - Cystoscopy w/ bladder biopsy - Cystoscopy w/ retrograde pyelogram - Cystoscopy w/ hydrodistention - Cystoscopy w/ ureteral stent placement - Cystolitholapaxy - Ureteroscopy w/ ureteral stent placement - Ureteroscopy w/ stone removal - Urolift - TURBT - Prostate biopsy  ***10 day global period: - Circumcision  ***Everything else pretty much is 90 day global period (including TURP)  No orders of the defined types were placed in this encounter.   It has been explained that the patient is to follow regularly with their PCP in addition to all other providers involved in their care and to follow instructions provided by these respective offices. Patient advised to contact urology clinic if any urologic-pertaining questions, concerns, new symptoms or problems arise in the interim period.  There are no Patient Instructions on file for this visit.  Electronically signed by:  Donnita Falls, MSN, FNP-C, CUNP 10/29/2023 12:13 PM

## 2023-11-01 DIAGNOSIS — E785 Hyperlipidemia, unspecified: Secondary | ICD-10-CM | POA: Diagnosis not present

## 2023-11-01 DIAGNOSIS — I10 Essential (primary) hypertension: Secondary | ICD-10-CM | POA: Diagnosis not present

## 2023-11-01 DIAGNOSIS — E1140 Type 2 diabetes mellitus with diabetic neuropathy, unspecified: Secondary | ICD-10-CM | POA: Diagnosis not present

## 2023-11-01 DIAGNOSIS — E049 Nontoxic goiter, unspecified: Secondary | ICD-10-CM | POA: Diagnosis not present

## 2023-11-01 DIAGNOSIS — E1165 Type 2 diabetes mellitus with hyperglycemia: Secondary | ICD-10-CM | POA: Diagnosis not present

## 2023-11-01 DIAGNOSIS — Z23 Encounter for immunization: Secondary | ICD-10-CM | POA: Diagnosis not present

## 2023-11-01 DIAGNOSIS — Z6839 Body mass index (BMI) 39.0-39.9, adult: Secondary | ICD-10-CM | POA: Diagnosis not present

## 2023-11-01 DIAGNOSIS — H6122 Impacted cerumen, left ear: Secondary | ICD-10-CM | POA: Diagnosis not present

## 2023-11-01 DIAGNOSIS — G250 Essential tremor: Secondary | ICD-10-CM | POA: Diagnosis not present

## 2023-11-03 ENCOUNTER — Ambulatory Visit: Payer: Medicare Other | Admitting: Urology

## 2023-11-03 ENCOUNTER — Encounter: Payer: Self-pay | Admitting: Urology

## 2023-11-03 VITALS — BP 131/79 | HR 61 | Temp 98.4°F | Ht 69.0 in | Wt 260.0 lb

## 2023-11-03 DIAGNOSIS — R32 Unspecified urinary incontinence: Secondary | ICD-10-CM | POA: Diagnosis not present

## 2023-11-03 DIAGNOSIS — N319 Neuromuscular dysfunction of bladder, unspecified: Secondary | ICD-10-CM | POA: Diagnosis not present

## 2023-11-03 DIAGNOSIS — Z09 Encounter for follow-up examination after completed treatment for conditions other than malignant neoplasm: Secondary | ICD-10-CM

## 2023-11-03 DIAGNOSIS — R339 Retention of urine, unspecified: Secondary | ICD-10-CM

## 2023-11-03 DIAGNOSIS — Z978 Presence of other specified devices: Secondary | ICD-10-CM

## 2024-01-11 DIAGNOSIS — L570 Actinic keratosis: Secondary | ICD-10-CM | POA: Diagnosis not present

## 2024-01-11 DIAGNOSIS — D0439 Carcinoma in situ of skin of other parts of face: Secondary | ICD-10-CM | POA: Diagnosis not present

## 2024-01-11 DIAGNOSIS — D485 Neoplasm of uncertain behavior of skin: Secondary | ICD-10-CM | POA: Diagnosis not present

## 2024-01-11 DIAGNOSIS — D0462 Carcinoma in situ of skin of left upper limb, including shoulder: Secondary | ICD-10-CM | POA: Diagnosis not present

## 2024-01-11 DIAGNOSIS — C4442 Squamous cell carcinoma of skin of scalp and neck: Secondary | ICD-10-CM | POA: Diagnosis not present

## 2024-01-11 DIAGNOSIS — D045 Carcinoma in situ of skin of trunk: Secondary | ICD-10-CM | POA: Diagnosis not present

## 2024-02-03 DIAGNOSIS — C4442 Squamous cell carcinoma of skin of scalp and neck: Secondary | ICD-10-CM | POA: Diagnosis not present

## 2024-02-04 DIAGNOSIS — E7849 Other hyperlipidemia: Secondary | ICD-10-CM | POA: Diagnosis not present

## 2024-02-04 DIAGNOSIS — E782 Mixed hyperlipidemia: Secondary | ICD-10-CM | POA: Diagnosis not present

## 2024-02-04 DIAGNOSIS — E7801 Familial hypercholesterolemia: Secondary | ICD-10-CM | POA: Diagnosis not present

## 2024-02-04 DIAGNOSIS — Z1321 Encounter for screening for nutritional disorder: Secondary | ICD-10-CM | POA: Diagnosis not present

## 2024-02-04 DIAGNOSIS — Z1329 Encounter for screening for other suspected endocrine disorder: Secondary | ICD-10-CM | POA: Diagnosis not present

## 2024-02-04 DIAGNOSIS — E7800 Pure hypercholesterolemia, unspecified: Secondary | ICD-10-CM | POA: Diagnosis not present

## 2024-02-04 DIAGNOSIS — E11622 Type 2 diabetes mellitus with other skin ulcer: Secondary | ICD-10-CM | POA: Diagnosis not present

## 2024-02-10 DIAGNOSIS — Z0001 Encounter for general adult medical examination with abnormal findings: Secondary | ICD-10-CM | POA: Diagnosis not present

## 2024-02-10 DIAGNOSIS — Z6839 Body mass index (BMI) 39.0-39.9, adult: Secondary | ICD-10-CM | POA: Diagnosis not present

## 2024-02-10 DIAGNOSIS — E559 Vitamin D deficiency, unspecified: Secondary | ICD-10-CM | POA: Diagnosis not present

## 2024-02-10 DIAGNOSIS — E1169 Type 2 diabetes mellitus with other specified complication: Secondary | ICD-10-CM | POA: Diagnosis not present

## 2024-02-10 DIAGNOSIS — E7800 Pure hypercholesterolemia, unspecified: Secondary | ICD-10-CM | POA: Diagnosis not present

## 2024-02-10 DIAGNOSIS — E1140 Type 2 diabetes mellitus with diabetic neuropathy, unspecified: Secondary | ICD-10-CM | POA: Diagnosis not present

## 2024-02-16 ENCOUNTER — Inpatient Hospital Stay: Payer: Medicare Other | Attending: Oncology | Admitting: Oncology

## 2024-02-16 ENCOUNTER — Encounter: Payer: Self-pay | Admitting: Oncology

## 2024-02-16 ENCOUNTER — Inpatient Hospital Stay: Payer: Medicare Other

## 2024-02-16 VITALS — BP 121/66 | HR 68 | Temp 97.6°F | Resp 16 | Ht 69.0 in | Wt 257.9 lb

## 2024-02-16 DIAGNOSIS — D696 Thrombocytopenia, unspecified: Secondary | ICD-10-CM | POA: Diagnosis not present

## 2024-02-16 DIAGNOSIS — K921 Melena: Secondary | ICD-10-CM | POA: Diagnosis not present

## 2024-02-16 DIAGNOSIS — D72829 Elevated white blood cell count, unspecified: Secondary | ICD-10-CM | POA: Insufficient documentation

## 2024-02-16 DIAGNOSIS — Z79899 Other long term (current) drug therapy: Secondary | ICD-10-CM | POA: Diagnosis not present

## 2024-02-16 DIAGNOSIS — R7402 Elevation of levels of lactic acid dehydrogenase (LDH): Secondary | ICD-10-CM

## 2024-02-16 LAB — CBC WITH DIFFERENTIAL/PLATELET
Abs Immature Granulocytes: 0.1 10*3/uL — ABNORMAL HIGH (ref 0.00–0.07)
Basophils Absolute: 0.1 10*3/uL (ref 0.0–0.1)
Basophils Relative: 1 %
Eosinophils Absolute: 0.4 10*3/uL (ref 0.0–0.5)
Eosinophils Relative: 4 %
HCT: 43.8 % (ref 39.0–52.0)
Hemoglobin: 13.7 g/dL (ref 13.0–17.0)
Immature Granulocytes: 1 %
Lymphocytes Relative: 14 %
Lymphs Abs: 1.6 10*3/uL (ref 0.7–4.0)
MCH: 28.7 pg (ref 26.0–34.0)
MCHC: 31.3 g/dL (ref 30.0–36.0)
MCV: 91.6 fL (ref 80.0–100.0)
Monocytes Absolute: 1.3 10*3/uL — ABNORMAL HIGH (ref 0.1–1.0)
Monocytes Relative: 11 %
Neutro Abs: 8 10*3/uL — ABNORMAL HIGH (ref 1.7–7.7)
Neutrophils Relative %: 69 %
Platelets: 171 10*3/uL (ref 150–400)
RBC: 4.78 MIL/uL (ref 4.22–5.81)
RDW: 14.3 % (ref 11.5–15.5)
WBC: 11.5 10*3/uL — ABNORMAL HIGH (ref 4.0–10.5)
nRBC: 0 % (ref 0.0–0.2)

## 2024-02-16 LAB — C-REACTIVE PROTEIN: CRP: 0.7 mg/dL (ref <1.0)

## 2024-02-16 LAB — IRON AND TIBC
Iron: 95 ug/dL (ref 45–182)
Saturation Ratios: 22 % (ref 17.9–39.5)
TIBC: 431 ug/dL (ref 250–450)
UIBC: 336 ug/dL

## 2024-02-16 LAB — COMPREHENSIVE METABOLIC PANEL
ALT: 31 U/L (ref 0–44)
AST: 29 U/L (ref 15–41)
Albumin: 4.6 g/dL (ref 3.5–5.0)
Alkaline Phosphatase: 44 U/L (ref 38–126)
Anion gap: 14 (ref 5–15)
BUN: 16 mg/dL (ref 8–23)
CO2: 28 mmol/L (ref 22–32)
Calcium: 10.3 mg/dL (ref 8.9–10.3)
Chloride: 98 mmol/L (ref 98–111)
Creatinine, Ser: 0.92 mg/dL (ref 0.61–1.24)
GFR, Estimated: >60 mL/min (ref >60)
Glucose, Bld: 112 mg/dL — ABNORMAL HIGH (ref 70–99)
Potassium: 4.1 mmol/L (ref 3.5–5.1)
Sodium: 140 mmol/L (ref 135–145)
Total Bilirubin: 0.8 mg/dL (ref 0.0–1.2)
Total Protein: 7.5 g/dL (ref 6.5–8.1)

## 2024-02-16 LAB — SEDIMENTATION RATE: Sed Rate: 15 mm/h (ref 0–16)

## 2024-02-16 LAB — FOLATE: Folate: 39.9 ng/mL (ref >5.9)

## 2024-02-16 LAB — FERRITIN: Ferritin: 181 ng/mL (ref 24–336)

## 2024-02-16 LAB — HEPATITIS PANEL, ACUTE
HCV Ab: NONREACTIVE
Hep A IgM: NONREACTIVE
Hep B C IgM: NONREACTIVE
Hepatitis B Surface Ag: NONREACTIVE

## 2024-02-16 LAB — VITAMIN B12: Vitamin B-12: 486 pg/mL (ref 180–914)

## 2024-02-16 LAB — LACTATE DEHYDROGENASE: LDH: 128 U/L (ref 98–192)

## 2024-02-16 NOTE — Patient Instructions (Signed)
 VISIT SUMMARY:  During today's visit, we discussed your recent blood work showing low platelets and high white blood cells. We also reviewed your history of prostate cancer, chronic catheter use, and a recent episode of black stools. You mentioned occasional stomach discomfort and a past history of a bleeding ulcer. We have outlined a plan to address these issues and will follow up in two weeks to review your lab results.  YOUR PLAN:  -THROMBOCYTOPENIA: Thrombocytopenia means having a low platelet count, which can affect blood clotting. This may be related to your use of Primidone. We will check your vitamin B12 and iron levels, perform an ultrasound of your abdomen to look at your liver and spleen, and run a viral panel to check for infections like hepatitis and HIV.  -LEUKOCYTOSIS: Leukocytosis means having a high white blood cell count, which can indicate an infection or inflammation. This might be related to your chronic catheter use or a recent skin procedure. We will repeat your complete blood count to monitor this.  -MELENA: Melena refers to black stools, which can indicate bleeding in the digestive tract. Although this has resolved, given your history of a bleeding ulcer, we will refer you to a gastroenterologist for further evaluation, which may include a colonoscopy.  INSTRUCTIONS:  Please follow up in 2 weeks to review your lab results and determine if further evaluation is needed.

## 2024-02-17 DIAGNOSIS — K921 Melena: Secondary | ICD-10-CM | POA: Insufficient documentation

## 2024-02-17 DIAGNOSIS — C44529 Squamous cell carcinoma of skin of other part of trunk: Secondary | ICD-10-CM | POA: Diagnosis not present

## 2024-02-17 DIAGNOSIS — D696 Thrombocytopenia, unspecified: Secondary | ICD-10-CM | POA: Insufficient documentation

## 2024-02-17 DIAGNOSIS — C44629 Squamous cell carcinoma of skin of left upper limb, including shoulder: Secondary | ICD-10-CM | POA: Diagnosis not present

## 2024-02-17 DIAGNOSIS — L820 Inflamed seborrheic keratosis: Secondary | ICD-10-CM | POA: Diagnosis not present

## 2024-02-17 DIAGNOSIS — D485 Neoplasm of uncertain behavior of skin: Secondary | ICD-10-CM | POA: Diagnosis not present

## 2024-02-17 DIAGNOSIS — D72829 Elevated white blood cell count, unspecified: Secondary | ICD-10-CM | POA: Insufficient documentation

## 2024-02-17 NOTE — Assessment & Plan Note (Signed)
 Patient has leukocytosis with neutrophil predominance at this time.  Chart review showed leukocytosis as far as in 2011.  Can be possibly related to chronic catheter use.  No B symptoms at this time. -Will repeat CBC today. -If persistence of leukocytosis will obtain a flow cytometry, LDH and uric acid.  Return to clinic in 2 weeks to discuss results.

## 2024-02-17 NOTE — Assessment & Plan Note (Signed)
 Mild thrombocytopenia at this time.  Improved significantly from September 2024 when it was 33.  Can also be related to primidone use.  No significant bleeding symptoms reported at this time. -Order nutritional studies including vitamin B12 and iron levels. -Order ultrasound of abdomen to assess for liver disease or splenomegaly. -Order hepatitis viral panel .

## 2024-02-17 NOTE — Assessment & Plan Note (Signed)
 Reported black stools for 2-3 weeks, now resolved. History of bleeding ulcer 20 years ago. -Refer to gastroenterology for evaluation, possibly including colonoscopy.

## 2024-02-17 NOTE — Progress Notes (Signed)
 [facility] at [facility] HEMATOLOGY NEW VISIT  Donetta Potts, MD  REASON FOR REFERRAL: Leukocytosis and thrombocytopenia  HISTORY OF PRESENT ILLNESS: Discussed the use of AI scribe software for clinical note transcription with the patient, who gave verbal consent to proceed.   Marc Burns 78 y.o. adult referred for leukocytosis and thrombocytopenia.  He is accompanied by his wife today.  He reports that he has no complaints at this time.  He was at a wellness visit and labs during that time showed low platelets and high white count, but repeat after 1 week still showed persistence of these findings.  Patient does report an episode of black stools lasting 2 to 3 weeks before this wellness visit and since that has resolved.  He alsomentions occasional stomach discomfort, described as a rolling or growling sensation, although he does not feel hungry during these episodes. He denies any recent weight loss, night sweats, fever, or chills, and reports no significant fatigue or shortness of breath.  On review of his chart, patient was admitted in September 2024 for lumbar pain and at that time had platelets of 33.  Patient stated that he was not aware of this and did not have any episodes of bleeding or complications from this.  Platelets since then have improved significantly.  Patient did have leukocytosis since as far as 2011.  Patient overall feels well and has no complaints.  He is a non-smoker, drinks beer occasionally.  Lives with his wife in [place].  He is currently retired.   I have reviewed the past medical history, past surgical history, social history and family history with the patient   ALLERGIES:  is allergic to sulfa antibiotics.  MEDICATIONS:  Current Outpatient Medications  Medication Sig Dispense Refill   amLODipine-benazepril (LOTREL) 5-20 MG per capsule Take 1 capsule by mouth every morning.     aspirin EC 81 MG tablet Take 81 mg by mouth daily.  Swallow whole.     cholecalciferol (VITAMIN D3) 25 MCG (1000 UNIT) tablet Take 1,000 Units by mouth 2 (two) times daily.     ciprofloxacin (CIPRO) 250 MG tablet Take 1 tablet (250 mg total) by mouth See admin instructions. Take twice daily as needed for 5 days for UTI 10 tablet 5   empagliflozin (JARDIANCE) 25 MG TABS tablet Take 25 mg by mouth daily.     furosemide (LASIX) 20 MG tablet Take 20 mg by mouth daily.     gabapentin (NEURONTIN) 600 MG tablet Take 600 mg by mouth 3 (three) times daily.     ibuprofen (ADVIL) 200 MG tablet Take 400 mg by mouth every 6 (six) hours as needed for moderate pain or headache.     metoprolol tartrate (LOPRESSOR) 25 MG tablet Take 25 mg by mouth 2 (two) times daily.     Multiple Vitamins-Minerals (CENTRUM SILVER ADULT 50+ PO) Take 1 tablet by mouth daily.     omeprazole (PRILOSEC) 20 MG capsule Take 20 mg by mouth daily.     primidone (MYSOLINE) 50 MG tablet Take 1 tablet (50 mg total) by mouth 2 (two) times daily. 180 tablet 3   rosuvastatin (CRESTOR) 10 MG tablet Take 10 mg by mouth at bedtime.     Semaglutide, 1 MG/DOSE, (OZEMPIC, 1 MG/DOSE,) 2 MG/1.5ML SOPN Inject 1 mg into the skin every Sunday.     No current facility-administered medications for this visit.     REVIEW OF SYSTEMS:   Constitutional: Denies fevers, chills or  night sweats Eyes: Denies blurriness of vision Ears, nose, mouth, throat, and face: Denies mucositis or sore throat Respiratory: Denies cough, dyspnea or wheezes Cardiovascular: Denies palpitation, chest discomfort or lower extremity swelling Gastrointestinal:  Denies nausea, heartburn or change in bowel habits Skin: Denies abnormal skin rashes Lymphatics: Denies new lymphadenopathy or easy bruising Neurological:Denies numbness, tingling or new weaknesses Behavioral/Psych: Mood is stable, no new changes  All other systems were reviewed with the patient and are negative.  PHYSICAL EXAMINATION:   Vitals:   02/16/24 1258  BP:  121/66  Pulse: 68  Resp: 16  Temp: 97.6 F (36.4 C)  SpO2: 96%    GENERAL:alert, no distress and comfortable LYMPH:  no palpable lymphadenopathy in the cervical, axillary or inguinal LUNGS: clear to auscultation and percussion with normal breathing effort HEART: regular rate & rhythm and no murmurs and no lower extremity edema ABDOMEN:abdomen soft, non-tender and normal bowel sounds Musculoskeletal:no cyanosis of digits and no clubbing  NEURO: alert & oriented x 3 with fluent speech  LABORATORY DATA:  I have reviewed the data as listed  Lab Results  Component Value Date   WBC 11.5 (H) 02/16/2024   NEUTROABS 8.0 (H) 02/16/2024   HGB 13.7 02/16/2024   HCT 43.8 02/16/2024   MCV 91.6 02/16/2024   PLT 171 02/16/2024      Component Value Date/Time   NA 140 02/16/2024 1332   NA 139 07/24/2021 1042   K 4.1 02/16/2024 1332   CL 98 02/16/2024 1332   CO2 28 02/16/2024 1332   GLUCOSE 112 (H) 02/16/2024 1332   BUN 16 02/16/2024 1332   BUN 15 07/24/2021 1042   CREATININE 0.92 02/16/2024 1332   CALCIUM 10.3 02/16/2024 1332   PROT 7.5 02/16/2024 1332   PROT 7.2 07/21/2021 1123   ALBUMIN 4.6 02/16/2024 1332   ALBUMIN 4.8 (H) 07/21/2021 1123   AST 29 02/16/2024 1332   ALT 31 02/16/2024 1332   ALKPHOS 44 02/16/2024 1332   BILITOT 0.8 02/16/2024 1332   BILITOT 0.5 07/21/2021 1123   GFRNONAA >60 02/16/2024 1332   GFRAA  11/17/2010 1403    >60        The eGFR has been calculated using the MDRD equation. This calculation has not been validated in all clinical situations. eGFR's persistently <60 mL/min signify possible Chronic Kidney Disease.       Chemistry      Component Value Date/Time   NA 140 02/16/2024 1332   NA 139 07/24/2021 1042   K 4.1 02/16/2024 1332   CL 98 02/16/2024 1332   CO2 28 02/16/2024 1332   BUN 16 02/16/2024 1332   BUN 15 07/24/2021 1042   CREATININE 0.92 02/16/2024 1332      Component Value Date/Time   CALCIUM 10.3 02/16/2024 1332   ALKPHOS  44 02/16/2024 1332   AST 29 02/16/2024 1332   ALT 31 02/16/2024 1332   BILITOT 0.8 02/16/2024 1332   BILITOT 0.5 07/21/2021 1123      Latest Reference Range & Units 02/26/10 12:30 11/17/10 14:03 09/09/22 19:17 02/16/24 13:32  WBC 4.0 - 10.5 K/uL 15.5 (H) 9.4 14.0 (H) 11.5 (H)  (H): Data is abnormally high   Latest Reference Range & Units 02/26/10 12:30 11/17/10 14:03 09/09/22 19:17 02/16/24 13:32  Platelets 150 - 400 K/uL 292 138 (L) 33 (L) 171  (L): Data is abnormally low   ASSESSMENT & PLAN:  Patient is a 78 year old male referred for leukocytosis and thrombocytopenia   Thrombocytopenia (HCC) Mild thrombocytopenia  at this time.  Improved significantly from September 2024 when it was 33.  Can also be related to primidone use.  No significant bleeding symptoms reported at this time. -Order nutritional studies including vitamin B12 and iron levels. -Order ultrasound of abdomen to assess for liver disease or splenomegaly. -Order hepatitis viral panel .  Leukocytosis Patient has leukocytosis with neutrophil predominance at this time.  Chart review showed leukocytosis as far as in 2011.  Can be possibly related to chronic catheter use.  No B symptoms at this time. -Will repeat CBC today. -If persistence of leukocytosis will obtain a flow cytometry, LDH and uric acid.  Return to clinic in 2 weeks to discuss results.  Melena Reported black stools for 2-3 weeks, now resolved. History of bleeding ulcer 20 years ago. -Refer to gastroenterology for evaluation, possibly including colonoscopy.   Orders Placed This Encounter  Procedures   CBC with Differential/Platelet    Standing Status:   Future    Number of Occurrences:   1    Expected Date:   02/16/2024    Expiration Date:   02/15/2025   Comprehensive metabolic panel    Standing Status:   Future    Number of Occurrences:   1    Expected Date:   02/16/2024    Expiration Date:   02/15/2025   Vitamin B12    Standing Status:   Future     Number of Occurrences:   1    Expected Date:   02/16/2024    Expiration Date:   02/15/2025   Ferritin    Standing Status:   Future    Number of Occurrences:   1    Expected Date:   02/16/2024    Expiration Date:   02/15/2025   Folate    Standing Status:   Future    Number of Occurrences:   1    Expected Date:   02/16/2024    Expiration Date:   02/15/2025   Iron and TIBC    Standing Status:   Future    Number of Occurrences:   1    Expected Date:   02/16/2024    Expiration Date:   02/15/2025   Sedimentation rate    Standing Status:   Future    Number of Occurrences:   1    Expected Date:   02/16/2024    Expiration Date:   02/15/2025   C-reactive protein    Standing Status:   Future    Number of Occurrences:   1    Expected Date:   02/16/2024    Expiration Date:   02/15/2025   Hepatitis panel, acute    Standing Status:   Future    Number of Occurrences:   1    Expected Date:   02/16/2024    Expiration Date:   02/15/2025    Release to patient:   Immediate [1]   Lactate dehydrogenase    Standing Status:   Future    Number of Occurrences:   1    Expected Date:   02/16/2024    Expiration Date:   02/15/2025    The total time spent in the appointment was 45 minutes encounter with patients including review of chart and various tests results, discussions about plan of care and coordination of care plan   All questions were answered. The patient knows to call the clinic with any problems, questions or concerns. No barriers to learning was detected.   Cindie Crumbly, MD 2/27/20254:08  PM

## 2024-02-24 ENCOUNTER — Ambulatory Visit: Payer: Medicare Other | Admitting: Internal Medicine

## 2024-02-24 ENCOUNTER — Encounter: Payer: Self-pay | Admitting: Internal Medicine

## 2024-02-24 VITALS — BP 126/72 | HR 65 | Temp 96.8°F | Ht 68.0 in | Wt 259.6 lb

## 2024-02-24 DIAGNOSIS — K219 Gastro-esophageal reflux disease without esophagitis: Secondary | ICD-10-CM | POA: Diagnosis not present

## 2024-02-24 DIAGNOSIS — E1165 Type 2 diabetes mellitus with hyperglycemia: Secondary | ICD-10-CM | POA: Diagnosis not present

## 2024-02-24 DIAGNOSIS — K921 Melena: Secondary | ICD-10-CM

## 2024-02-24 NOTE — Progress Notes (Signed)
 Primary Care Physician:  Donetta Potts, MD Primary Gastroenterologist:  Dr. Marletta Lor  Chief Complaint  Patient presents with   Dark stools    Patient here today due to seeing dark stools. Denies any sight of bright red blood. Patient says for the last two week this issues has subsided, and he is doing well. Patient says he has a history of a bleeding ulcer.    HPI:   Marc Burns is a 78 y.o. adult who presents to clinic today by referral from his PCP Dr. Mayford Knife for evaluation.  Patient reports approximately 5 to 6 weeks ago he had sudden onset of black sticky stools.  This lasted approximately 2-1/2 weeks.  Since that time he has had normalization of his bowel movements.  No gross hematochezia.  No abdominal pain.  Takes ibuprofen very rarely for headaches.  Chronic GERD well-controlled on omeprazole daily.  Denies any dysphagia odynophagia, no epigastric or chest pain.  Believes he had a bleeding ulcer 20 years ago.  Recent blood work with normal hemoglobin, normal iron studies.  Following with hematology due to thrombocytopenia and leukocytosis.  Reports his last colonoscopy was around 2018 which she states was normal besides diverticulosis.  No family history of GI malignancy.   Past Medical History:  Diagnosis Date   Arthritis    Bladder neck contracture    Complication of anesthesia    Difficult intubation   Diabetes mellitus without complication (HCC)    Difficult intubation    Foley catheter in place    GERD (gastroesophageal reflux disease)    History of gastric ulcer    2007   History of kidney stones    History of prostate cancer    2004--  S/P PROSTATECTOMY   Hypertension    Urinary incontinence    Wears glasses     Past Surgical History:  Procedure Laterality Date   BALLOON DILATION N/A 09/12/2013   Procedure: CYSTO BALLOON DILATION/RETROGRADE URETHROGRAM;  Surgeon: Martina Sinner, MD;  Location: [facility];  Service: Urology;   Laterality: N/A;   BOTOX INJECTION N/A 01/08/2022   Procedure: BOTOX INJECTION;  Surgeon: Malen Gauze, MD;  Location: [facility];  Service: Urology;  Laterality: N/A;   BOTOX INJECTION N/A 08/13/2022   Procedure: BOTOX INJECTION;  Surgeon: Malen Gauze, MD;  Location: [facility];  Service: Urology;  Laterality: N/A;   BOTOX INJECTION N/A 04/15/2023   Procedure: BOTOX INJECTION;  Surgeon: Malen Gauze, MD;  Location: [facility];  Service: Urology;  Laterality: N/A;   CYSTO/  URETHRAL DILATION/ BLADDER BX/ BILATERAL RETROGRADE PYELOGRAM  03-03-2010   CYSTO/ BALLOON DILATION AND VAPORIZATION BLADDER NECK & PROSTATIC FOSSA  04-28-2010   CYSTO/ BALLOON DILATION BLADDER NECK CONTRACTURE  07-23-2011;   11-17-2010;  08-08-2010   CYSTOSCOPY N/A 01/08/2022   Procedure: CYSTOSCOPY WITH INTRAVESICAL BOTOX 100 UNITS;  Surgeon: Malen Gauze, MD;  Location: [facility];  Service: Urology;  Laterality: N/A;   CYSTOSCOPY N/A 04/15/2023   Procedure: CYSTOSCOPY WITH FULGURATION OF BLEEDING;  Surgeon: Malen Gauze, MD;  Location: [facility];  Service: Urology;  Laterality: N/A;   CYSTOSCOPY WITH DIRECT VISION INTERNAL URETHROTOMY N/A 04/24/2021   Procedure: CYSTOSCOPY WITH DIRECT VISION INTERNAL URETHROTOMY;  Surgeon: Malen Gauze, MD;  Location: [facility];  Service: Urology;  Laterality: N/A;   CYSTOSCOPY WITH INJECTION N/A 08/13/2022   Procedure: CYSTOSCOPY WITH INJECTION;  Surgeon: Malen Gauze, MD;  Location: [facility];  Service: Urology;  Laterality: N/A;   CYSTOSCOPY WITH INJECTION N/A 10/21/2023   Procedure: CYSTOSCOPY WITH INJECTION- Botox;  Surgeon: Malen Gauze, MD;  Location: [facility];  Service: Urology;  Laterality: N/A;   FULGURATION OF LESION  04/24/2021   Procedure: FULGURATION OF BLADDER NECK;  Surgeon: Malen Gauze, MD;  Location: [facility];  Service: Urology;;   MITOMYCIN C INJECTION  04/24/2021   Procedure: MITOMYCIN C INJECTION ANTERIOR BLADDER WALL;  Surgeon: Malen Gauze, MD;  Location: [facility];  Service: Urology;;   PROSTATECTOMY  2004   RADICAL   TONSILLECTOMY  AS CHILD   TRANSURETHRAL INCISION AND RESECTION OF BLADDER NECK CONTRACTURE  09-01-2010   UMBILICAL HERNIA REPAIR      Current Outpatient Medications  Medication Sig Dispense Refill   amLODipine-benazepril (LOTREL) 5-20 MG per capsule Take 1 capsule by mouth every morning.     aspirin EC 81 MG tablet Take 81 mg by mouth daily. Swallow whole.     cholecalciferol (VITAMIN D3) 25 MCG (1000 UNIT) tablet Take 1,000 Units by mouth 2 (two) times daily.     empagliflozin (JARDIANCE) 25 MG TABS tablet Take 25 mg by mouth daily.     furosemide (LASIX) 20 MG tablet Take 20 mg by mouth daily.     gabapentin (NEURONTIN) 600 MG tablet Take 600 mg by mouth 3 (three) times daily.     ibuprofen (ADVIL) 200 MG tablet Take 400 mg by mouth every 6 (six) hours as needed for moderate pain or headache.     metoprolol tartrate (LOPRESSOR) 25 MG tablet Take 25 mg by mouth 2 (two) times daily.     Multiple Vitamins-Minerals (CENTRUM SILVER ADULT 50+ PO) Take 1 tablet by mouth daily.     omeprazole (PRILOSEC) 20 MG capsule Take 20 mg by mouth daily.     primidone (MYSOLINE) 50 MG tablet Take 1 tablet (50 mg total) by mouth 2 (two) times daily. 180 tablet 3   rosuvastatin (CRESTOR) 10 MG tablet Take 10 mg by mouth at bedtime.     Semaglutide, 1 MG/DOSE, (OZEMPIC, 1 MG/DOSE,) 2 MG/1.5ML SOPN Inject 1 mg into the skin every Sunday.     No current facility-administered medications for this visit.    Allergies as of 02/24/2024 - Review Complete 02/24/2024  Allergen Reaction Noted   Sulfa antibiotics Itching and Rash 09/05/2013    Family History  Problem Relation Age of Onset   Heart attack Mother    Heart attack Father     Social History   Socioeconomic History   Marital status: Married    Spouse name: Chyrl Civatte   Number of children: Not on file   Years of education: Not on file   Highest education level:  Not on file  Occupational History   Not on file  Tobacco Use   Smoking status: Never   Smokeless tobacco: Never  Vaping Use   Vaping status: Never Used  Substance and Sexual Activity   Alcohol use: Yes    Alcohol/week: 6.0 standard drinks of alcohol    Types: 6 Cans of beer per week    Comment: occasionally   Drug use: No   Sexual activity: Not Currently  Other Topics Concern   Not on file  Social History Narrative   Not on file   Social Drivers of Health   Financial Resource Strain: Not on file  Food Insecurity: No Food Insecurity (02/16/2024)   Hunger Vital Sign    Worried About Running Out of Food  in the Last Year: Never true    Ran Out of Food in the Last Year: Never true  Transportation Needs: No Transportation Needs (02/16/2024)   PRAPARE - Administrator, Civil Service (Medical): No    Lack of Transportation (Non-Medical): No  Physical Activity: Not on file  Stress: Not on file  Social Connections: Not on file  Intimate Partner Violence: Not At Risk (02/16/2024)   Humiliation, Afraid, Rape, and Kick questionnaire    Fear of Current or Ex-Partner: No    Emotionally Abused: No    Physically Abused: No    Sexually Abused: No    Subjective: Review of Systems  Constitutional:  Negative for chills and fever.  HENT:  Negative for congestion and hearing loss.   Eyes:  Negative for blurred vision and double vision.  Respiratory:  Negative for cough and shortness of breath.   Cardiovascular:  Negative for chest pain and palpitations.  Gastrointestinal:  Negative for abdominal pain, blood in stool, constipation, diarrhea, heartburn, melena and vomiting.  Genitourinary:  Negative for dysuria and urgency.  Musculoskeletal:  Negative for joint pain and myalgias.  Skin:  Negative for itching and rash.  Neurological:  Negative for dizziness and headaches.  Psychiatric/Behavioral:  Negative for depression. The patient is not nervous/anxious.         Objective: BP 126/72 (BP Location: Left Arm, Patient Position: Sitting, Cuff Size: Large)   Pulse 65   Temp (!) 96.8 F (36 C) (Oral)   Ht 5\' 8"  (1.727 m)   Wt 259 lb 9.6 oz (117.8 kg)   BMI 39.47 kg/m  Physical Exam Constitutional:      Appearance: Normal appearance.  HENT:     Head: Normocephalic and atraumatic.  Eyes:     Extraocular Movements: Extraocular movements intact.     Conjunctiva/sclera: Conjunctivae normal.  Cardiovascular:     Rate and Rhythm: Normal rate and regular rhythm.  Pulmonary:     Effort: Pulmonary effort is normal.     Breath sounds: Normal breath sounds.  Abdominal:     General: Bowel sounds are normal.     Palpations: Abdomen is soft.  Musculoskeletal:        General: No swelling. Normal range of motion.     Cervical back: Normal range of motion and neck supple.  Skin:    General: Skin is warm and dry.     Coloration: Skin is not jaundiced.  Neurological:     General: No focal deficit present.     Mental Status: He is alert and oriented to person, place, and time.  Psychiatric:        Mood and Affect: Mood normal.        Behavior: Behavior normal.      Assessment/Plan:  1.  Melena-occurred for approximately 2-1/2 weeks, symptoms have completely resolved at this time.  Normal bowel movements over the last 3 weeks or so.  Hemoglobin and iron studies reassuring.  No abdominal pain.  No chronic NSAID use.  Offered upper endoscopy and colonoscopy to further evaluate.  Patient would like to hold off at this time given his symptoms have resolved.  I did counsel him that by not pursuing endoscopic evaluation we could be missing something in his GI tract, even something sinister such as cancer and he understands.  If he has resurgence of his symptoms he will call office and we can set him up for procedures.  2.  Chronic GERD-well-controlled on omeprazole 20 mg daily.  Will continue.  Follow-up as needed.  Thank you Dr. Mayford Knife for the  kind referral  02/24/2024 2:03 PM   Disclaimer: This note was dictated with voice recognition software. Similar sounding words can inadvertently be transcribed and may not be corrected upon review.

## 2024-02-24 NOTE — Patient Instructions (Signed)
 Given that your symptoms have resolved, your iron studies as well as blood counts are normal, I think it is okay to continue to monitor for now.  If you have resurgence of your symptoms then let us know and we can set up endoscopic evaluation.  It was very nice meeting you today.  GO HOKIES   Dr. Marletta Lor

## 2024-03-02 ENCOUNTER — Inpatient Hospital Stay: Payer: Medicare Other | Attending: Oncology | Admitting: Oncology

## 2024-03-02 VITALS — BP 133/69 | HR 67 | Temp 96.7°F | Resp 16 | Wt 256.6 lb

## 2024-03-02 DIAGNOSIS — K921 Melena: Secondary | ICD-10-CM

## 2024-03-02 DIAGNOSIS — D696 Thrombocytopenia, unspecified: Secondary | ICD-10-CM | POA: Insufficient documentation

## 2024-03-02 DIAGNOSIS — C44329 Squamous cell carcinoma of skin of other parts of face: Secondary | ICD-10-CM | POA: Diagnosis not present

## 2024-03-02 DIAGNOSIS — D72829 Elevated white blood cell count, unspecified: Secondary | ICD-10-CM | POA: Diagnosis not present

## 2024-03-02 NOTE — Assessment & Plan Note (Signed)
 Does not report any more episodes.  Follows with Dr. Marletta Lor and refused endoscopy and colonoscopy.

## 2024-03-02 NOTE — Patient Instructions (Signed)
 VISIT SUMMARY:  During today's appointment, we reviewed your recent blood work and discussed your ongoing health concerns. Your white blood cell count remains slightly elevated, but you are not experiencing any symptoms. We will continue to monitor this condition closely. Additionally, we discussed the importance of reporting any new symptoms that may arise.  YOUR PLAN:  -LEUKOCYTOSIS: Leukocytosis means having a higher than normal white blood cell count, which can be due to various reasons including chronic conditions like leukemia or lymphoma. Your white blood cell count is currently 11.5, down from 14, and you are not showing any symptoms. We will repeat your blood test in 6 months to monitor this condition. Please report any new symptoms such as fever, chills, weakness, night sweats, or unusual swellings immediately. You can contact our clinic if you have any concerns.  INSTRUCTIONS:  Please schedule a follow-up blood test in 6 months, including a leukemia test. Report any new symptoms such as fever, chills, weakness, night sweats, or unusual swellings to our clinic immediately.

## 2024-03-02 NOTE — Assessment & Plan Note (Signed)
 Resolved at this time.

## 2024-03-02 NOTE — Assessment & Plan Note (Signed)
 Chronic leukocytosis with WBC count of 11.5, previously 14. Asymptomatic. Differential includes CLL but patient has no B symptoms.  LDH, ESR and CRP normal. - Repeat labs in 6 months, including flow cytometry - Continue to monitor counts - Instructed him to report new symptoms such as fever, chills, weakness, night sweats, or unusual swellings.

## 2024-03-02 NOTE — Progress Notes (Signed)
 [facility]  HEMATOLOGY FOLLOW-UP VISIT  Donetta Potts, MD  REASON FOR FOLLOW-UP: Leukocytosis and thrombocytopenia  ASSESSMENT & PLAN:  Patient is an 78 year old male following for leukocytosis and thrombocytopenia   Thrombocytopenia (HCC) Resolved at this time.  Melena Does not report any more episodes.  Follows with Dr. Marletta Lor and refused endoscopy and colonoscopy.  Leukocytosis Chronic leukocytosis with WBC count of 11.5, previously 14. Asymptomatic. Differential includes CLL but patient has no B symptoms.  LDH, ESR and CRP normal. - Repeat labs in 6 months, including flow cytometry - Continue to monitor counts - Instructed him to report new symptoms such as fever, chills, weakness, night sweats, or unusual swellings.  Return to clinic in 6 months with labs  Orders Placed This Encounter  Procedures   CBC with Differential/Platelet    Standing Status:   Future    Expected Date:   08/28/2024    Expiration Date:   03/02/2025   Comprehensive metabolic panel    Standing Status:   Future    Expected Date:   08/28/2024    Expiration Date:   03/02/2025   Flow Cytometry, Peripheral Blood (Oncology)    Standing Status:   Future    Expected Date:   08/28/2024    Expiration Date:   03/02/2025   BCR-ABL1, CML/ALL, PCR, QUANT    Standing Status:   Future    Expected Date:   09/02/2024    Expiration Date:   03/02/2025    The total time spent in the appointment was 20 minutes encounter with patients including review of chart and various tests results, discussions about plan of care and coordination of care plan   All questions were answered. The patient knows to call the clinic with any problems, questions or concerns. No barriers to learning was detected.  Cindie Crumbly, MD 3/13/20251:27 PM    INTERVAL HISTORY: Lujean Rave 78 y.o. adult following for leukocytosis and thrombocytopenia. Recently had lesions removed by Dr. Orvan Falconer and Dr.  Jonita Albee. He is also under the care of Dr. Marletta Lor for gastrointestinal concerns. He reports dark stools, but no active symptoms at the time of the appointment.He denies any symptoms of infection or inflammation, such as fever, chills, or weight loss. He also denies night sweats. He feels well for his age and does not report any abnormal symptoms.  I have reviewed the past medical history, past surgical history, social history and family history with the patient   ALLERGIES:  is allergic to sulfa antibiotics.  MEDICATIONS:  Current Outpatient Medications  Medication Sig Dispense Refill   amLODipine-benazepril (LOTREL) 5-20 MG per capsule Take 1 capsule by mouth every morning.     aspirin EC 81 MG tablet Take 81 mg by mouth daily. Swallow whole.     cholecalciferol (VITAMIN D3) 25 MCG (1000 UNIT) tablet Take 1,000 Units by mouth 2 (two) times daily.     empagliflozin (JARDIANCE) 25 MG TABS tablet Take 25 mg by mouth daily.     furosemide (LASIX) 20 MG tablet Take 20 mg by mouth daily.     gabapentin (NEURONTIN) 600 MG tablet Take 600 mg by mouth 3 (three) times daily.     ibuprofen (ADVIL) 200 MG tablet Take 400 mg by mouth every 6 (six) hours as needed for moderate pain or headache.     metoprolol tartrate (LOPRESSOR) 25 MG tablet Take 25 mg by mouth 2 (two) times daily.     Multiple Vitamins-Minerals (CENTRUM  SILVER ADULT 50+ PO) Take 1 tablet by mouth daily.     omeprazole (PRILOSEC) 20 MG capsule Take 20 mg by mouth daily.     OZEMPIC, 1 MG/DOSE, 4 MG/3ML SOPN Inject 1 mg into the skin once a week.     primidone (MYSOLINE) 50 MG tablet Take 1 tablet (50 mg total) by mouth 2 (two) times daily. 180 tablet 3   rosuvastatin (CRESTOR) 10 MG tablet Take 10 mg by mouth at bedtime.     Semaglutide, 1 MG/DOSE, (OZEMPIC, 1 MG/DOSE,) 2 MG/1.5ML SOPN Inject 1 mg into the skin every Sunday.     No current facility-administered medications for this visit.     REVIEW OF SYSTEMS:   Constitutional: Denies  fevers, chills or night sweats Eyes: Denies blurriness of vision Ears, nose, mouth, throat, and face: Denies mucositis or sore throat Respiratory: Denies cough, dyspnea or wheezes Cardiovascular: Denies palpitation, chest discomfort or lower extremity swelling Gastrointestinal:  Denies nausea, heartburn or change in bowel habits Skin: Denies abnormal skin rashes Lymphatics: Denies new lymphadenopathy or easy bruising Neurological:Denies numbness, tingling or new weaknesses Behavioral/Psych: Mood is stable, no new changes  All other systems were reviewed with the patient and are negative.  PHYSICAL EXAMINATION:   Vitals:   03/02/24 1252  BP: 133/69  Pulse: 67  Resp: 16  Temp: (!) 96.7 F (35.9 C)  SpO2: 97%    GENERAL:alert, no distress and comfortable LYMPH:  no palpable lymphadenopathy in the cervical, axillary or inguinal LUNGS: clear to auscultation and percussion with normal breathing effort HEART: regular rate & rhythm and no murmurs and no lower extremity edema ABDOMEN:abdomen soft, non-tender and normal bowel sounds Musculoskeletal:no cyanosis of digits and no clubbing  NEURO: alert & oriented x 3 with fluent speech  LABORATORY DATA:  I have reviewed the data as listed   Latest Reference Range & Units 02/16/24 13:32  WBC 4.0 - 10.5 K/uL 11.5 (H)  RBC 4.22 - 5.81 MIL/uL 4.78  Hemoglobin 13.0 - 17.0 g/dL 16.1  HCT 09.6 - 04.5 % 43.8  MCV 80.0 - 100.0 fL 91.6  MCH 26.0 - 34.0 pg 28.7  MCHC 30.0 - 36.0 g/dL 40.9  RDW 81.1 - 91.4 % 14.3  Platelets 150 - 400 K/uL 171  nRBC 0.0 - 0.2 % 0.0  Neutrophils % 69  Lymphocytes % 14  Monocytes Relative % 11  Eosinophil % 4  Basophil % 1  Immature Granulocytes % 1  NEUT# 1.7 - 7.7 K/uL 8.0 (H)  Lymphs Abs 0.7 - 4.0 K/uL 1.6  Monocyte # 0.1 - 1.0 K/uL 1.3 (H)  Eosinophils Absolute 0.0 - 0.5 K/uL 0.4  Basophils Absolute 0.0 - 0.1 K/uL 0.1  Abs Immature Granulocytes 0.00 - 0.07 K/uL 0.10 (H)  Sed Rate 0 - 16 mm/hr 15   (H): Data is abnormally high    Chemistry      Component Value Date/Time   NA 140 02/16/2024 1332   NA 139 07/24/2021 1042   K 4.1 02/16/2024 1332   CL 98 02/16/2024 1332   CO2 28 02/16/2024 1332   BUN 16 02/16/2024 1332   BUN 15 07/24/2021 1042   CREATININE 0.92 02/16/2024 1332      Component Value Date/Time   CALCIUM 10.3 02/16/2024 1332   ALKPHOS 44 02/16/2024 1332   AST 29 02/16/2024 1332   ALT 31 02/16/2024 1332   BILITOT 0.8 02/16/2024 1332   BILITOT 0.5 07/21/2021 1123      Latest Reference Range &  Units 02/16/24 13:32  Iron 45 - 182 ug/dL 95  UIBC ug/dL 098  TIBC 119 - 147 ug/dL 829  Saturation Ratios 17.9 - 39.5 % 22  Ferritin 24 - 336 ng/mL 181  Folate >5.9 ng/mL 39.9  CRP <1.0 mg/dL 0.7  Vitamin F62 130 - 914 pg/mL 486    Latest Reference Range & Units 02/16/24 13:32  Hep A Ab, IgM NON REACTIVE  NON REACTIVE  Hepatitis B Surface Ag NON REACTIVE  NON REACTIVE  Hep B Core Ab, IgM NON REACTIVE  NON REACTIVE  HCV Ab NON REACTIVE  NON REACTIVE

## 2024-03-09 DIAGNOSIS — E7800 Pure hypercholesterolemia, unspecified: Secondary | ICD-10-CM | POA: Diagnosis not present

## 2024-03-09 DIAGNOSIS — Z6839 Body mass index (BMI) 39.0-39.9, adult: Secondary | ICD-10-CM | POA: Diagnosis not present

## 2024-03-09 DIAGNOSIS — E559 Vitamin D deficiency, unspecified: Secondary | ICD-10-CM | POA: Diagnosis not present

## 2024-03-09 DIAGNOSIS — E1140 Type 2 diabetes mellitus with diabetic neuropathy, unspecified: Secondary | ICD-10-CM | POA: Diagnosis not present

## 2024-03-09 DIAGNOSIS — E7801 Familial hypercholesterolemia: Secondary | ICD-10-CM | POA: Diagnosis not present

## 2024-03-09 DIAGNOSIS — E1169 Type 2 diabetes mellitus with other specified complication: Secondary | ICD-10-CM | POA: Diagnosis not present

## 2024-03-20 DIAGNOSIS — E782 Mixed hyperlipidemia: Secondary | ICD-10-CM | POA: Diagnosis not present

## 2024-03-20 DIAGNOSIS — E1169 Type 2 diabetes mellitus with other specified complication: Secondary | ICD-10-CM | POA: Diagnosis not present

## 2024-03-20 DIAGNOSIS — E559 Vitamin D deficiency, unspecified: Secondary | ICD-10-CM | POA: Diagnosis not present

## 2024-03-31 ENCOUNTER — Ambulatory Visit: Payer: Medicare Other | Admitting: Urology

## 2024-03-31 ENCOUNTER — Encounter: Payer: Self-pay | Admitting: Urology

## 2024-03-31 VITALS — BP 118/75 | HR 65

## 2024-03-31 DIAGNOSIS — N319 Neuromuscular dysfunction of bladder, unspecified: Secondary | ICD-10-CM | POA: Diagnosis not present

## 2024-03-31 NOTE — Progress Notes (Signed)
 Name: Marc Burns DOB: [birth year] MRN: [id number]  History of Present Illness: Marc Burns is a 78 y.o. adult who presents today for follow up visit at [facility].  GU History includes: 1. Prostate cancer. Underwent prostatectomy in 2004. 2. Neurogenic bladder with urinary retention and incontinence.  - Managed with chronic indwelling Foley catheter. - Leakage refractory to anticholinergic therapy; improved with intravesical Botox previously. 3. Bladder neck contracture. 4. Kidney stones.  At last visit on 11/03/2023: Doing well after bladder Botox procedure (200 units) by Dr. Ronne Binning on 10/21/2023.   Today: He reports that his bladder spasms continue to be well controlled following the Botox procedure; denies need to repeat that quite yet. He is doing well with exchanging at home approximately 1x/month.    Medications: Current Outpatient Medications  Medication Sig Dispense Refill   amLODipine-benazepril (LOTREL) 5-20 MG per capsule Take 1 capsule by mouth every morning.     aspirin EC 81 MG tablet Take 81 mg by mouth daily. Swallow whole.     cholecalciferol (VITAMIN D3) 25 MCG (1000 UNIT) tablet Take 1,000 Units by mouth 2 (two) times daily.     empagliflozin (JARDIANCE) 25 MG TABS tablet Take 25 mg by mouth daily.     furosemide (LASIX) 20 MG tablet Take 20 mg by mouth daily.     gabapentin (NEURONTIN) 600 MG tablet Take 600 mg by mouth 3 (three) times daily.     ibuprofen (ADVIL) 200 MG tablet Take 400 mg by mouth every 6 (six) hours as needed for moderate pain or headache.     metoprolol tartrate (LOPRESSOR) 25 MG tablet Take 25 mg by mouth 2 (two) times daily.     Multiple Vitamins-Minerals (CENTRUM SILVER ADULT 50+ PO) Take 1 tablet by mouth daily.     omeprazole (PRILOSEC) 20 MG capsule Take 20 mg by mouth daily.     OZEMPIC, 1 MG/DOSE, 4 MG/3ML SOPN Inject 1 mg into the skin once a week.     primidone (MYSOLINE) 50 MG tablet Take 1 tablet (50 mg  total) by mouth 2 (two) times daily. 180 tablet 3   rosuvastatin (CRESTOR) 10 MG tablet Take 10 mg by mouth at bedtime.     Semaglutide, 1 MG/DOSE, (OZEMPIC, 1 MG/DOSE,) 2 MG/1.5ML SOPN Inject 1 mg into the skin every Sunday.     No current facility-administered medications for this visit.    Allergies: Allergies  Allergen Reactions   Sulfa Antibiotics Itching and Rash    Past Medical History:  Diagnosis Date   Arthritis    Bladder neck contracture    Complication of anesthesia    Difficult intubation   Diabetes mellitus without complication (HCC)    Difficult intubation    Foley catheter in place    GERD (gastroesophageal reflux disease)    History of gastric ulcer    2007   History of kidney stones    History of prostate cancer    2004--  S/P PROSTATECTOMY   Hypertension    Urinary incontinence    Wears glasses    Past Surgical History:  Procedure Laterality Date   BALLOON DILATION N/A 09/12/2013   Procedure: CYSTO BALLOON DILATION/RETROGRADE URETHROGRAM;  Surgeon: Martina Sinner, MD;  Location: [facility];  Service: Urology;  Laterality: N/A;   BOTOX INJECTION N/A 01/08/2022   Procedure: BOTOX INJECTION;  Surgeon: Malen Gauze, MD;  Location: [facility];  Service: Urology;  Laterality: N/A;   BOTOX INJECTION N/A 08/13/2022  Procedure: BOTOX INJECTION;  Surgeon: Malen Gauze, MD;  Location: [facility];  Service: Urology;  Laterality: N/A;   BOTOX INJECTION N/A 04/15/2023   Procedure: BOTOX INJECTION;  Surgeon: Malen Gauze, MD;  Location: [facility];  Service: Urology;  Laterality: N/A;   CYSTO/  URETHRAL DILATION/ BLADDER BX/ BILATERAL RETROGRADE PYELOGRAM  03-03-2010   CYSTO/ BALLOON DILATION AND VAPORIZATION BLADDER NECK & PROSTATIC FOSSA  04-28-2010   CYSTO/ BALLOON DILATION BLADDER NECK CONTRACTURE  07-23-2011;   11-17-2010;  08-08-2010   CYSTOSCOPY N/A 01/08/2022   Procedure: CYSTOSCOPY WITH INTRAVESICAL BOTOX 100 UNITS;  Surgeon:  Malen Gauze, MD;  Location: [facility];  Service: Urology;  Laterality: N/A;   CYSTOSCOPY N/A 04/15/2023   Procedure: CYSTOSCOPY WITH FULGURATION OF BLEEDING;  Surgeon: Malen Gauze, MD;  Location: [facility];  Service: Urology;  Laterality: N/A;   CYSTOSCOPY WITH DIRECT VISION INTERNAL URETHROTOMY N/A 04/24/2021   Procedure: CYSTOSCOPY WITH DIRECT VISION INTERNAL URETHROTOMY;  Surgeon: Malen Gauze, MD;  Location: [facility];  Service: Urology;  Laterality: N/A;   CYSTOSCOPY WITH INJECTION N/A 08/13/2022   Procedure: CYSTOSCOPY WITH INJECTION;  Surgeon: Malen Gauze, MD;  Location: [facility];  Service: Urology;  Laterality: N/A;   CYSTOSCOPY WITH INJECTION N/A 10/21/2023   Procedure: CYSTOSCOPY WITH INJECTION- Botox;  Surgeon: Malen Gauze, MD;  Location: [facility];  Service: Urology;  Laterality: N/A;   FULGURATION OF LESION  04/24/2021   Procedure: FULGURATION OF BLADDER NECK;  Surgeon: Malen Gauze, MD;  Location: [facility];  Service: Urology;;   MITOMYCIN C INJECTION  04/24/2021   Procedure: MITOMYCIN C INJECTION ANTERIOR BLADDER WALL;  Surgeon: Malen Gauze, MD;  Location: [facility];  Service: Urology;;   PROSTATECTOMY  2004   RADICAL   TONSILLECTOMY  AS CHILD   TRANSURETHRAL INCISION AND RESECTION OF BLADDER NECK CONTRACTURE  09-01-2010   UMBILICAL HERNIA REPAIR     Family History  Problem Relation Age of Onset   Heart attack Mother    Heart attack Father    Social History   Socioeconomic History   Marital status: Married    Spouse name: Marc Burns   Number of children: Not on file   Years of education: Not on file   Highest education level: Not on file  Occupational History   Not on file  Tobacco Use   Smoking status: Never   Smokeless tobacco: Never  Vaping Use   Vaping status: Never Used  Substance and Sexual Activity   Alcohol use: Yes    Alcohol/week: 6.0 standard drinks of alcohol    Types: 6 Cans of beer per week    Comment: occasionally   Drug  use: No   Sexual activity: Not Currently  Other Topics Concern   Not on file  Social History Narrative   Not on file   Social Drivers of Health   Financial Resource Strain: Not on file  Food Insecurity: No Food Insecurity (02/16/2024)   Hunger Vital Sign    Worried About Running Out of Food in the Last Year: Never true    Ran Out of Food in the Last Year: Never true  Transportation Needs: No Transportation Needs (02/16/2024)   PRAPARE - Administrator, Civil Service (Medical): No    Lack of Transportation (Non-Medical): No  Physical Activity: Not on file  Stress: Not on file  Social Connections: Not on file  Intimate Partner Violence: Not At Risk (02/16/2024)   Humiliation,  Afraid, Rape, and Kick questionnaire    Fear of Current or Ex-Partner: No    Emotionally Abused: No    Physically Abused: No    Sexually Abused: No    Review of Systems Constitutional: Patient denies any unintentional weight loss or change in strength lntegumentary: Patient denies any rashes or pruritus Cardiovascular: Patient denies chest pain or syncope Respiratory: Patient denies shortness of breath Musculoskeletal: Patient denies muscle cramps or weakness Neurologic: Patient denies convulsions or seizures Allergic/Immunologic: Patient denies recent allergic reaction(s) Hematologic/Lymphatic: Patient denies bleeding tendencies Endocrine: Patient denies heat/cold intolerance  GU: As per HPI.  OBJECTIVE Vitals:   03/31/24 1108  BP: 118/75  Pulse: 65   There is no height or weight on file to calculate BMI.  Physical Examination Constitutional: No obvious distress; patient is non-toxic appearing  Cardiovascular: No visible lower extremity edema.  Respiratory: The patient does not have audible wheezing/stridor; respirations do not appear labored  Gastrointestinal: Abdomen non-distended Musculoskeletal: Normal ROM of UEs  Skin: No obvious rashes/open sores  Neurologic: CN 2-12 grossly  intact Psychiatric: Answered questions appropriately with normal affect  Hematologic/Lymphatic/Immunologic: No obvious bruises or sites of spontaneous bleeding  ASSESSMENT Neurogenic bladder - Plan: POCT urinalysis dipstick, CANCELED: BLADDER SCAN AMB NON-IMAGING  He is doing well. We agreed to plan for follow up in 3 months or sooner if needed. He was advised to notify Urology if he would like to repeat Botox prior to next visit so that surgery request can be submitted with Dr. Ronne Binning. Patient verbalized understanding of and agreement with current plan. All questions were answered.  PLAN Advised the following: 1. Return in about 3 months (around 06/30/2024) for UA, PVR, & f/u with Evette Georges NP.  Orders Placed This Encounter  Procedures   POCT urinalysis dipstick    It has been explained that the patient is to follow regularly with their PCP in addition to all other providers involved in their care and to follow instructions provided by these respective offices. Patient advised to contact urology clinic if any urologic-pertaining questions, concerns, new symptoms or problems arise in the interim period.  There are no Patient Instructions on file for this visit.  Electronically signed by:  Donnita Falls, FNP   03/31/24    11:52 AM

## 2024-04-13 ENCOUNTER — Other Ambulatory Visit: Payer: Self-pay | Admitting: Urology

## 2024-04-19 DIAGNOSIS — E782 Mixed hyperlipidemia: Secondary | ICD-10-CM | POA: Diagnosis not present

## 2024-04-19 DIAGNOSIS — E559 Vitamin D deficiency, unspecified: Secondary | ICD-10-CM | POA: Diagnosis not present

## 2024-04-19 DIAGNOSIS — E1169 Type 2 diabetes mellitus with other specified complication: Secondary | ICD-10-CM | POA: Diagnosis not present

## 2024-05-01 NOTE — Progress Notes (Unsigned)
 No chief complaint on file.     HISTORY OF PRESENT ILLNESS:  05/01/24 ALL:  Alvan returns for follow up for essential tremor. He continues primidone  50mg  BID.   04/27/2023 ALL:  Trenard returns for follow up for essential tremor. He continues primidone  50mg  BID. He is tolerating med well. Tremor has improved. He notes mostly when trying to carry something like a cup or plate. No resting tremor. No difficulty with balance or swallowing. He continues close follow up with PCP.   05/25/2022 ALL: Myrtie Atkinson returns for follow up for essential tremor. He was seen last 11/2021 and primidone  was increased to 25-50mg  in am and 50mg  at bedtime. Since, he reports doing well. He is tolerating primidone  without obvious adverse effects. Tremor waxes and wanes but he feels it is fairly mild and stable on current dose of primidone . Labs are followed by PCP.   11/20/2021 ALL:  Gillie Lacy is a 78 y.o. adult here today for follow up for bilateral hand tremor. He seen in consult with Dr Omar Bibber 07/2021. Tremor felt to be more consistent with an essential tremor and he was started on primidone  50mg  at bedtime. He has tolerated medication well. No obvious adverse effects. He is not certain tremor has improved but does not feel it is worse. He has difficulty hold a plate or cup. It is worse when anxious or tired. No resting tremor noted. He is feeling well today and without concerns.    HISTORY (copied from Dr Dail Drought previous note)  Dear Dr. Broadus Canes,   I saw your patient, Arjay Hibberd, upon your kind request in my neurologic clinic today for initial consultation of his hand tremor.  The patient is accompanied by his daughter, Amalia Badder, today.  As you know, Mr. Marz is a 78 year old right-handed gentleman with an underlying medical history of hypertension, diabetes, hyperlipidemia, pressure sore, reflux disease, history of gastric ulcer, history of kidney stones, prostate cancer, and obesity, who reports an  approximately 1+ year history of bilateral hand tremors.  He has noted the tremor primarily when he tries to hold a cup or feed himself.  Tremor is bilateral and mostly with holding and doing something with fine motor skills involved, not at rest.  He has not noticed any tremor in the upper body or legs.  He is not aware of any family history of tremors.  He is an only child.  Parents lived to be in their mid to late 80s and both had heart problems.  He has 3 grown children, none with tremors.  There is no obvious trigger or alleviating factor.  He has not had any recent medication changes.  Some days the tremor is hardly noticeable and some other days it is quite significant and bothersome.  He has been worried about having something like Parkinson's disease.  He limits his caffeine to 2 cups of coffee in the morning but does drink some soda 1 or 2 small bottles per day on average.  He tries to hydrate well.  He has tried to exercise his upper body with small weights, up to 5 pounds but did not notice any improvement in his tremor.  He denies sudden onset of one-sided weakness or numbness or tingling or droopy face or slurring of speech, denies any recurrent headaches. I reviewed your office note from 04/29/2021.  He has been on metoprolol.  He has been on gabapentin for diabetic neuropathy and is currently on 200 mg twice daily.  His last A1c  in February 2022 was 7.4.   REVIEW OF SYSTEMS: Out of a complete 14 system review of symptoms, the patient complains only of the following symptoms, tremor and all other reviewed systems are negative.   ALLERGIES: Allergies  Allergen Reactions   Sulfa Antibiotics Itching and Rash     HOME MEDICATIONS: Outpatient Medications Prior to Visit  Medication Sig Dispense Refill   amLODipine-benazepril (LOTREL) 5-20 MG per capsule Take 1 capsule by mouth every morning.     aspirin EC 81 MG tablet Take 81 mg by mouth daily. Swallow whole.     cholecalciferol (VITAMIN  D3) 25 MCG (1000 UNIT) tablet Take 1,000 Units by mouth 2 (two) times daily.     clotrimazole -betamethasone  (LOTRISONE ) cream APPLY  CREAM TOPICALLY TO AFFECTED AREA TWICE DAILY AS NEEDED FOR ITCHING 30 g 0   empagliflozin (JARDIANCE) 25 MG TABS tablet Take 25 mg by mouth daily.     furosemide  (LASIX ) 20 MG tablet Take 20 mg by mouth daily.     gabapentin (NEURONTIN) 600 MG tablet Take 600 mg by mouth 3 (three) times daily.     ibuprofen (ADVIL) 200 MG tablet Take 400 mg by mouth every 6 (six) hours as needed for moderate pain or headache.     metoprolol tartrate (LOPRESSOR) 25 MG tablet Take 25 mg by mouth 2 (two) times daily.     Multiple Vitamins-Minerals (CENTRUM SILVER ADULT 50+ PO) Take 1 tablet by mouth daily.     omeprazole (PRILOSEC) 20 MG capsule Take 20 mg by mouth daily.     OZEMPIC, 1 MG/DOSE, 4 MG/3ML SOPN Inject 1 mg into the skin once a week.     primidone  (MYSOLINE ) 50 MG tablet Take 1 tablet (50 mg total) by mouth 2 (two) times daily. 180 tablet 3   rosuvastatin (CRESTOR) 10 MG tablet Take 10 mg by mouth at bedtime.     Semaglutide, 1 MG/DOSE, (OZEMPIC, 1 MG/DOSE,) 2 MG/1.5ML SOPN Inject 1 mg into the skin every Sunday.     No facility-administered medications prior to visit.     PAST MEDICAL HISTORY: Past Medical History:  Diagnosis Date   Arthritis    Bladder neck contracture    Complication of anesthesia    Difficult intubation   Diabetes mellitus without complication (HCC)    Difficult intubation    Foley catheter in place    GERD (gastroesophageal reflux disease)    History of gastric ulcer    2007   History of kidney stones    History of prostate cancer    2004--  S/P PROSTATECTOMY   Hypertension    Urinary incontinence    Wears glasses      PAST SURGICAL HISTORY: Past Surgical History:  Procedure Laterality Date   BALLOON DILATION N/A 09/12/2013   Procedure: CYSTO BALLOON DILATION/RETROGRADE URETHROGRAM;  Surgeon: Devorah Fonder, MD;  Location:  [facility];  Service: Urology;  Laterality: N/A;   BOTOX  INJECTION N/A 01/08/2022   Procedure: BOTOX  INJECTION;  Surgeon: Marco Severs, MD;  Location: [facility];  Service: Urology;  Laterality: N/A;   BOTOX  INJECTION N/A 08/13/2022   Procedure: BOTOX  INJECTION;  Surgeon: Marco Severs, MD;  Location: [facility];  Service: Urology;  Laterality: N/A;   BOTOX  INJECTION N/A 04/15/2023   Procedure: BOTOX  INJECTION;  Surgeon: Marco Severs, MD;  Location: [facility];  Service: Urology;  Laterality: N/A;   CYSTO/  URETHRAL DILATION/ BLADDER BX/ BILATERAL RETROGRADE PYELOGRAM  03-03-2010   CYSTO/  BALLOON DILATION AND VAPORIZATION BLADDER NECK & PROSTATIC FOSSA  04-28-2010   CYSTO/ BALLOON DILATION BLADDER NECK CONTRACTURE  07-23-2011;   11-17-2010;  08-08-2010   CYSTOSCOPY N/A 01/08/2022   Procedure: CYSTOSCOPY WITH INTRAVESICAL BOTOX  100 UNITS;  Surgeon: Marco Severs, MD;  Location: [facility];  Service: Urology;  Laterality: N/A;   CYSTOSCOPY N/A 04/15/2023   Procedure: CYSTOSCOPY WITH FULGURATION OF BLEEDING;  Surgeon: Marco Severs, MD;  Location: [facility];  Service: Urology;  Laterality: N/A;   CYSTOSCOPY WITH DIRECT VISION INTERNAL URETHROTOMY N/A 04/24/2021   Procedure: CYSTOSCOPY WITH DIRECT VISION INTERNAL URETHROTOMY;  Surgeon: Marco Severs, MD;  Location: [facility];  Service: Urology;  Laterality: N/A;   CYSTOSCOPY WITH INJECTION N/A 08/13/2022   Procedure: CYSTOSCOPY WITH INJECTION;  Surgeon: Marco Severs, MD;  Location: [facility];  Service: Urology;  Laterality: N/A;   CYSTOSCOPY WITH INJECTION N/A 10/21/2023   Procedure: CYSTOSCOPY WITH INJECTION- Botox ;  Surgeon: Marco Severs, MD;  Location: [facility];  Service: Urology;  Laterality: N/A;   FULGURATION OF LESION  04/24/2021   Procedure: FULGURATION OF BLADDER NECK;  Surgeon: Marco Severs, MD;  Location: [facility];  Service: Urology;;   MITOMYCIN  C INJECTION  04/24/2021   Procedure: MITOMYCIN  C  INJECTION ANTERIOR BLADDER WALL;  Surgeon: Marco Severs, MD;  Location: [facility];  Service: Urology;;   PROSTATECTOMY  2004   RADICAL   TONSILLECTOMY  AS CHILD   TRANSURETHRAL INCISION AND RESECTION OF BLADDER NECK CONTRACTURE  09-01-2010   UMBILICAL HERNIA REPAIR       FAMILY HISTORY: Family History  Problem Relation Age of Onset   Heart attack Mother    Heart attack Father      SOCIAL HISTORY: Social History   Socioeconomic History   Marital status: Married    Spouse name: Glynis Lass   Number of children: Not on file   Years of education: Not on file   Highest education level: Not on file  Occupational History   Not on file  Tobacco Use   Smoking status: Never   Smokeless tobacco: Never  Vaping Use   Vaping status: Never Used  Substance and Sexual Activity   Alcohol  use: Yes    Alcohol /week: 6.0 standard drinks of alcohol     Types: 6 Cans of beer per week    Comment: occasionally   Drug use: No   Sexual activity: Not Currently  Other Topics Concern   Not on file  Social History Narrative   Not on file   Social Drivers of Health   Financial Resource Strain: Not on file  Food Insecurity: No Food Insecurity (02/16/2024)   Hunger Vital Sign    Worried About Running Out of Food in the Last Year: Never true    Ran Out of Food in the Last Year: Never true  Transportation Needs: No Transportation Needs (02/16/2024)   PRAPARE - Administrator, Civil Service (Medical): No    Lack of Transportation (Non-Medical): No  Physical Activity: Not on file  Stress: Not on file  Social Connections: Not on file  Intimate Partner Violence: Not At Risk (02/16/2024)   Humiliation, Afraid, Rape, and Kick questionnaire    Fear of Current or Ex-Partner: No    Emotionally Abused: No    Physically Abused: No    Sexually Abused: No     PHYSICAL EXAM  There were no vitals filed for this visit.    There is no height or  weight on file to calculate  BMI.  Generalized: Well developed, in no acute distress  Cardiology: normal rate and rhythm, no murmur auscultated  Respiratory: clear to auscultation bilaterally    Neurological examination  Mentation: Alert oriented to time, place, history taking. Follows all commands speech and language fluent Cranial nerve II-XII: Pupils were equal round reactive to light. Extraocular movements were full, visual field were full on confrontational test. Facial sensation and strength were normal. Head turning and shoulder shrug  were normal and symmetric. Motor: The motor testing reveals 5 over 5 strength of all 4 extremities. Good symmetric motor tone is noted throughout. Mild bilateral hand tremor noted with exam.  Gait and station: Gait is normal.    DIAGNOSTIC DATA (LABS, IMAGING, TESTING) - I reviewed patient records, labs, notes, testing and imaging myself where available.  Lab Results  Component Value Date   WBC 11.5 (H) 02/16/2024   HGB 13.7 02/16/2024   HCT 43.8 02/16/2024   MCV 91.6 02/16/2024   PLT 171 02/16/2024      Component Value Date/Time   NA 140 02/16/2024 1332   NA 139 07/24/2021 1042   K 4.1 02/16/2024 1332   CL 98 02/16/2024 1332   CO2 28 02/16/2024 1332   GLUCOSE 112 (H) 02/16/2024 1332   BUN 16 02/16/2024 1332   BUN 15 07/24/2021 1042   CREATININE 0.92 02/16/2024 1332   CALCIUM 10.3 02/16/2024 1332   PROT 7.5 02/16/2024 1332   PROT 7.2 07/21/2021 1123   ALBUMIN 4.6 02/16/2024 1332   ALBUMIN 4.8 (H) 07/21/2021 1123   AST 29 02/16/2024 1332   ALT 31 02/16/2024 1332   ALKPHOS 44 02/16/2024 1332   BILITOT 0.8 02/16/2024 1332   BILITOT 0.5 07/21/2021 1123   GFRNONAA >60 02/16/2024 1332   GFRAA  11/17/2010 1403    >60        The eGFR has been calculated using the MDRD equation. This calculation has not been validated in all clinical situations. eGFR's persistently <60 mL/min signify possible Chronic Kidney Disease.   No results found for: "CHOL", "HDL",  "LDLCALC", "LDLDIRECT", "TRIG", "CHOLHDL" Lab Results  Component Value Date   HGBA1C 7.4 (H) 10/19/2023   Lab Results  Component Value Date   VITAMINB12 486 02/16/2024   Lab Results  Component Value Date   TSH 1.720 07/21/2021        No data to display               No data to display           ASSESSMENT AND PLAN  78 y.o. year old adult  has a past medical history of Arthritis, Bladder neck contracture, Complication of anesthesia, Diabetes mellitus without complication (HCC), Difficult intubation, Foley catheter in place, GERD (gastroesophageal reflux disease), History of gastric ulcer, History of kidney stones, History of prostate cancer, Hypertension, Urinary incontinence, and Wears glasses. here with    No diagnosis found.  Gill has tolerated primidone  50mg  twice daily without adverse effects. Tremor is stable. He will continue current treatment plan. Healthy lifestyle habits encouraged. He will continue follow up with PCP as directed. Labs reviewed in Epic for today's visit. He will follow up with me in 1 year.    No orders of the defined types were placed in this encounter.     No orders of the defined types were placed in this encounter.      Terrilyn Fick, MSN, FNP-C 05/01/2024, 9:07 AM  [facility] [phone]  [address], [address] [place], [place] [address] [phone]

## 2024-05-01 NOTE — Patient Instructions (Signed)

## 2024-05-02 ENCOUNTER — Encounter: Payer: Self-pay | Admitting: Family Medicine

## 2024-05-02 ENCOUNTER — Ambulatory Visit (INDEPENDENT_AMBULATORY_CARE_PROVIDER_SITE_OTHER): Payer: Medicare Other | Admitting: Family Medicine

## 2024-05-02 VITALS — BP 125/73 | HR 69 | Ht 68.0 in | Wt 254.4 lb

## 2024-05-02 DIAGNOSIS — G250 Essential tremor: Secondary | ICD-10-CM | POA: Diagnosis not present

## 2024-05-02 MED ORDER — PRIMIDONE 50 MG PO TABS: 50.0000 mg | ORAL_TABLET | Freq: Three times a day (TID) | ORAL | 3 refills | Status: AC

## 2024-05-03 ENCOUNTER — Ambulatory Visit: Payer: Self-pay | Admitting: Family Medicine

## 2024-05-03 LAB — PRIMIDONE, SERUM
Phenobarbital, Serum: NOT DETECTED ug/mL (ref 15–40)
Primidone Lvl: 2.7 ug/mL — ABNORMAL LOW (ref 5.0–12.0)

## 2024-05-03 LAB — CBC WITH DIFFERENTIAL/PLATELET
Basophils Absolute: 0.1 10*3/uL (ref 0.0–0.2)
Basos: 1 %
EOS (ABSOLUTE): 0.3 10*3/uL (ref 0.0–0.4)
Eos: 3 %
Hematocrit: 48 % (ref 37.5–51.0)
Hemoglobin: 15.6 g/dL (ref 13.0–17.7)
Immature Grans (Abs): 0.1 10*3/uL (ref 0.0–0.1)
Immature Granulocytes: 1 %
Lymphocytes Absolute: 1.4 10*3/uL (ref 0.7–3.1)
Lymphs: 14 %
MCH: 29.3 pg (ref 26.6–33.0)
MCHC: 32.5 g/dL (ref 31.5–35.7)
MCV: 90 fL (ref 79–97)
Monocytes Absolute: 1.2 10*3/uL — ABNORMAL HIGH (ref 0.1–0.9)
Monocytes: 11 %
Neutrophils Absolute: 7.5 10*3/uL — ABNORMAL HIGH (ref 1.4–7.0)
Neutrophils: 70 %
Platelets: 153 10*3/uL (ref 150–450)
RBC: 5.33 x10E6/uL (ref 4.14–5.80)
RDW: 12.5 % (ref 11.6–15.4)
WBC: 10.6 10*3/uL (ref 3.4–10.8)

## 2024-05-03 NOTE — Telephone Encounter (Signed)
-----   Message from Amy Lomax sent at 05/03/2024  4:26 PM EDT ----- Please let Mr Lorenzano know that his lab work looks stable. His primidone  level is a little low so we should be fine to increase dose to 50mg  TID, as discussed, if he wishes. His WBC is now normal and neutrophils are improved. Have him continue close follow up with his care team. TY!

## 2024-05-03 NOTE — Telephone Encounter (Signed)
 Spoke to patient gave lab results Pt states will increase primidone  to 3x daily which was discussed at last office visit with Amy,NP Pt thanked me for calling

## 2024-05-10 DIAGNOSIS — D485 Neoplasm of uncertain behavior of skin: Secondary | ICD-10-CM | POA: Diagnosis not present

## 2024-05-10 DIAGNOSIS — L820 Inflamed seborrheic keratosis: Secondary | ICD-10-CM | POA: Diagnosis not present

## 2024-05-10 DIAGNOSIS — L570 Actinic keratosis: Secondary | ICD-10-CM | POA: Diagnosis not present

## 2024-05-19 DIAGNOSIS — E782 Mixed hyperlipidemia: Secondary | ICD-10-CM | POA: Diagnosis not present

## 2024-05-19 DIAGNOSIS — E1169 Type 2 diabetes mellitus with other specified complication: Secondary | ICD-10-CM | POA: Diagnosis not present

## 2024-05-19 DIAGNOSIS — E559 Vitamin D deficiency, unspecified: Secondary | ICD-10-CM | POA: Diagnosis not present

## 2024-06-02 DIAGNOSIS — E1165 Type 2 diabetes mellitus with hyperglycemia: Secondary | ICD-10-CM | POA: Diagnosis not present

## 2024-06-02 DIAGNOSIS — E7800 Pure hypercholesterolemia, unspecified: Secondary | ICD-10-CM | POA: Diagnosis not present

## 2024-06-02 DIAGNOSIS — E7849 Other hyperlipidemia: Secondary | ICD-10-CM | POA: Diagnosis not present

## 2024-06-02 DIAGNOSIS — E1140 Type 2 diabetes mellitus with diabetic neuropathy, unspecified: Secondary | ICD-10-CM | POA: Diagnosis not present

## 2024-06-02 DIAGNOSIS — E7801 Familial hypercholesterolemia: Secondary | ICD-10-CM | POA: Diagnosis not present

## 2024-06-08 ENCOUNTER — Other Ambulatory Visit: Payer: Self-pay | Admitting: Urology

## 2024-06-08 ENCOUNTER — Telehealth: Payer: Self-pay | Admitting: Urology

## 2024-06-08 DIAGNOSIS — R32 Unspecified urinary incontinence: Secondary | ICD-10-CM

## 2024-06-08 DIAGNOSIS — N320 Bladder-neck obstruction: Secondary | ICD-10-CM

## 2024-06-08 DIAGNOSIS — Z8546 Personal history of malignant neoplasm of prostate: Secondary | ICD-10-CM

## 2024-06-08 DIAGNOSIS — Z9079 Acquired absence of other genital organ(s): Secondary | ICD-10-CM

## 2024-06-08 DIAGNOSIS — R339 Retention of urine, unspecified: Secondary | ICD-10-CM

## 2024-06-08 DIAGNOSIS — N319 Neuromuscular dysfunction of bladder, unspecified: Secondary | ICD-10-CM

## 2024-06-08 NOTE — Telephone Encounter (Signed)
 Patient return call. Patient state's he would like his botox  done at the hospital and not in office. Patient is aware I would reach back out once Isa Manuel reviews. Patient voiced understanding.

## 2024-06-08 NOTE — Telephone Encounter (Signed)
 Patient was made aware and voiced understanding OK I will place surgery request for cysto with bladder Botox  for Dr. Claretta Croft.

## 2024-06-08 NOTE — Telephone Encounter (Signed)
 Tried calling patient. Patient was unable, left message with patient's wife to have patient reach back out to office. Marc Burns voiced understanding.

## 2024-06-08 NOTE — Telephone Encounter (Signed)
 Patient would like to have botox  done. He has appt July 11 but has been having issues for the last week.

## 2024-06-09 DIAGNOSIS — Z6838 Body mass index (BMI) 38.0-38.9, adult: Secondary | ICD-10-CM | POA: Diagnosis not present

## 2024-06-09 DIAGNOSIS — E559 Vitamin D deficiency, unspecified: Secondary | ICD-10-CM | POA: Diagnosis not present

## 2024-06-09 DIAGNOSIS — E1169 Type 2 diabetes mellitus with other specified complication: Secondary | ICD-10-CM | POA: Diagnosis not present

## 2024-06-09 DIAGNOSIS — E7800 Pure hypercholesterolemia, unspecified: Secondary | ICD-10-CM | POA: Diagnosis not present

## 2024-06-09 DIAGNOSIS — E1140 Type 2 diabetes mellitus with diabetic neuropathy, unspecified: Secondary | ICD-10-CM | POA: Diagnosis not present

## 2024-06-09 DIAGNOSIS — E7801 Familial hypercholesterolemia: Secondary | ICD-10-CM | POA: Diagnosis not present

## 2024-06-19 DIAGNOSIS — E559 Vitamin D deficiency, unspecified: Secondary | ICD-10-CM | POA: Diagnosis not present

## 2024-06-19 DIAGNOSIS — E782 Mixed hyperlipidemia: Secondary | ICD-10-CM | POA: Diagnosis not present

## 2024-06-19 DIAGNOSIS — E1169 Type 2 diabetes mellitus with other specified complication: Secondary | ICD-10-CM | POA: Diagnosis not present

## 2024-06-22 DIAGNOSIS — H25813 Combined forms of age-related cataract, bilateral: Secondary | ICD-10-CM | POA: Diagnosis not present

## 2024-06-22 NOTE — Patient Instructions (Signed)
 Marc Burns  06/22/2024     @[email or web address] @   Your procedure is scheduled on  06/29/2024.   [facility] to [name] at  0730 A.M.   Call this number if you have problems the morning of surgery:  [phone]  If you experience any cold or flu symptoms such as cough, fever, chills, shortness of breath, etc. between now and your scheduled surgery, please notify us  at the above number.   Remember:        Your last dose of ozempic should have been on 06/21/2024.       Your last dose of jardiance and aspirin should be on 06/25/2024.        DO NOT take any medications for diabetes the morning of your procedure.   Do not eat after midnight.   You may drink clear liquids until  0530 am on 06/29/2024.    Clear liquids allowed are:                    Water , Juice (No red color; non-citric and without pulp; diabetics please choose diet or no sugar options), Carbonated beverages (diabetics please choose diet or no sugar options), Clear Tea (No creamer, milk, or cream, including half & half and powdered creamer), Black Coffee Only (No creamer, milk or cream, including half & half and powdered creamer), and Clear Sports drink (No red color; diabetics please choose diet or no sugar options)    Take these medicines the morning of surgery with A SIP OF WATER                                  gabapentin, metoprolol, primidone .    Do not wear jewelry, make-up or nail polish, including gel polish,  artificial nails, or any other type of covering on natural nails (fingers and  toes).  Do not wear lotions, powders, or perfumes, or deodorant.  Do not shave 48 hours prior to surgery.  Men may shave face and neck.  Do not bring valuables to the hospital.  [facility] is not responsible for any belongings or valuables.  Contacts, dentures or bridgework may not be worn into surgery.  Leave your suitcase in the car.  After surgery it may be brought to your room.  For patients admitted to the  hospital, discharge time will be determined by your treatment team.  Patients discharged the day of surgery will not be allowed to drive home and must have someone with them for 24 hours.    Special instructions:   DO NOT smoke tobacco or vape for 24 hours before your procedure.  Please read over the following fact sheets that you were given. Coughing and Deep Breathing, Surgical Site Infection Prevention, Anesthesia Post-op Instructions, and Care and Recovery After Surgery      Cystoscopy A cystoscopy may be done to find or treat a condition in your lower urinary tract. Your lower urinary tract includes your bladder and urethra. The urethra is the part of your body that drains pee (urine) from your bladder. You may need this procedure if: You have: Urinary tract infections (UTIs) that keep coming back. Blood in your pee. Pain when you pee. A blockage in your urethra, such as a urinary stone. You can't control when you pee, or you have to pee a lot. There are cells that aren't normal in your pee sample. A problem  is found in your bladder during a test. You need a biopsy. This is when a small piece of tissue is removed for testing. Tell a health care provider about: Any allergies you have. All medicines you take. These include vitamins, herbs, eye drops, and creams. Any problems you or family members have had with anesthesia. Any bleeding problems you have. Any surgeries you've had. Any medical conditions you have. Whether you're pregnant or may be pregnant. What are the risks? Your health care provider will talk with you about risks. These may include: Infection. Bleeding. Allergic reactions to medicines. Damage to your urethra or bladder. What happens before the procedure? Medicines Ask about changing or stopping: Any medicines you take. Any vitamins, herbs, or supplements you take. Do not take aspirin or ibuprofen unless you're told to. Tests You may have an exam or  tests. These may include: Pee tests to check for signs of infection. X-rays of: Your bladder. Your urethra. Your kidneys. A CT scan of your belly or hips. General instructions Eat and drink only as you've been told. For your safety, you may: Need to wash your skin with a soap that kills germs. Get antibiotics. Have hair removed at the procedure site. If you'll be going home right after the procedure, plan to have a responsible adult: Take you home from the hospital or clinic. You won't be allowed to drive. Stay with you for the time you're told. What happens during the procedure?  You may be given: A sedative to help you relax. Anesthesia to keep you from feeling pain. The opening of your urethra will be cleaned. A thin tube called a cystoscope will be put into your urethra. The tube has a light and camera on the end of it. The tube will be passed into your bladder. A germ-free (sterile) fluid will flow through the tube. The fluid will stretch your bladder. This helps your provider see the walls of your bladder more clearly. A biopsy may be taken. Stones may be removed. The tube will be taken out. Your bladder will be emptied. The procedure may vary among providers and hospitals. What can I expect after the procedure? It's common to have: Some soreness or pain in your belly and urethra. Mild pain or burning when you pee. The pain should stop a few minutes after you pee. This may last for up to a week. A small amount of blood in your pee for a few days. A feeling like you need to pee often. But when you do, you may only pee a little. Follow these instructions at home: Medicines Take your medicines only as told. If you were given antibiotics, take them as told. Do not stop taking them even if you start to feel better. General instructions If you were given a sedative, do not drive or use machines until you're told it's safe. A sedative can make you sleepy. Eat and drink as  told. If a biopsy was taken, ask when your test results will be ready and how to get them. You may need to call or meet with your provider to get your results. Ask what things are safe for you to do at home. Ask when you can go back to work or school. Contact a health care provider if: Your pain gets worse. Your pain doesn't get better with medicine. You have trouble peeing. You have more blood in your pee. You have a fever or chills. Get help right away if: You have blood clots in  your pee. You can't pee. This information is not intended to replace advice given to you by your health care provider. Make sure you discuss any questions you have with your health care provider. Document Revised: 04/13/2023 Document Reviewed: 04/13/2023 Elsevier Patient Education  2024 [employer]Botulinum Toxin Bladder Injection  A botulinum toxin bladder injection is a procedure to treat an overactive bladder. During the procedure, a drug called botulinum toxin is injected into the bladder through a long, thin needle. This drug relaxes the bladder muscles and reduces overactivity. You may need this procedure if your medicines are not working or you cannot take them. The procedure may be repeated as needed. The treatment is done once and it usually lasts for 6 months. Your health care provider will monitor you to see how well you respond. Tell a health care provider about: Any allergies you have. All medicines you are taking, including vitamins, herbs, eye drops, creams, and over-the-counter medicines. Any problems you or family members have had with anesthetic medicines. Any bleeding problems you have. Any surgeries you have had. Any medical conditions you have. Any previous reactions to a botulinum toxin injection. Any symptoms of urinary tract infection. These include chills, fever, a burning feeling when passing urine, and needing to pass urine often. Whether you are pregnant or may be pregnant. What  are the risks? Generally this is a safe procedure. However, problems may occur, including: Not being able to pass urine. If this happens, you may need to have your bladder emptied with a thin tube (urinary catheter). Bleeding. Urinary tract infection. Allergic reaction to the botulinum toxin. Pain or burning when passing urine. Damage to nearby structures or organs. What happens before the procedure? When to stop eating and drinking Follow instructions from your health care provider about what you may eat and drink before your procedure. These may include: 8 hours before the procedure Stop eating most foods. Do not eat meat, fried foods, or fatty foods. Eat only light foods, such as toast or crackers. All liquids are okay except energy drinks and alcohol . 6 hours before the procedure Stop eating. Drink only clear liquids, such as water , clear fruit juice, black coffee, plain tea, and sports drinks. Do not drink energy drinks or alcohol . 2 hours before the procedure Stop drinking all liquids. You may be allowed to take medicines with small sips of water . If you do not follow your health care provider's instructions, your procedure may be delayed or canceled. Medicines Ask your health care provider about: Changing or stopping your regular medicines. This is especially important if you are taking diabetes medicines or blood thinners. Taking medicines such as aspirin and ibuprofen. These medicines can thin your blood. Do not take these medicines unless your health care provider tells you to take them. Taking over-the-counter medicines, vitamins, herbs, and supplements. General instructions Ask your health care provider what steps will be taken to help prevent infection. These steps may include: Removing hair at the procedure site. Washing skin with a germ-killing soap. Taking antibiotic medicine. If you will be going home right after the procedure, plan to have a responsible adult: Take  you home from the hospital or clinic. You will not be allowed to drive. Care for you for the time you are told. What happens during the procedure?  You will be asked to empty your bladder. An IV will be inserted into one of your veins. You will be given one or more of the following: A medicine to help you  relax (sedative). A medicine to numb the area (local anesthetic). A medicine to make you fall asleep (general anesthetic). A long, thin scope called a cystoscope will be passed into your bladder through the part of the body that carries urine from your bladder (urethra). The cystoscope will be used to fill your bladder with water . A long needle will be passed through the cystoscope and into the bladder. The botulinum toxin will be injected into your bladder. It may be injected into multiple areas of your bladder. The cystoscope will be removed and your bladder will be emptied with a urinary catheter. The procedure may vary among health care providers and hospitals. What can I expect after the procedure? After your procedure, it is common to have: Blood-tinged urine. Burning or soreness when you pass urine. Follow these instructions at home: Medicines Take over-the-counter and prescription medicines only as told by your health care provider. If you were prescribed an antibiotic medicine, take it as told by your health care provider. Do not stop using the antibiotic even if you start to feel better. General instructions  If you were given a sedative during the procedure, it can affect you for several hours. Do not drive or operate machinery until your health care provider says that it is safe. Drink enough fluid to keep your urine pale yellow. Return to your normal activities as told by your health care provider. Ask your health care provider what activities are safe for you. Keep all follow-up visits. Contact a health care provider if you have: A fever or chills. Blood-tinged urine  for more than one day after your procedure. Worsening pain or burning when you pass urine. Pain or burning when passing urine for more than two days after your procedure. Trouble emptying your bladder. Get help right away if you: Have bright red blood in your urine. Are unable to pass urine. Summary A botulinum toxin bladder injection is a procedure to treat an overactive bladder. This is generally a safe procedure. However, problems may occur, including not being able to pass urine, bleeding, infection, pain, and an allergic reaction to the botulinum toxin. You will be told when to stop eating and drinking, and what medicines to change or stop. Follow instructions carefully. After the procedure, it is common to have blood in your urine and to have soreness or burning when passing urine. Contact a health care provider if you have a fever, blood in your urine for more than a few days, or trouble passing urine. Get help right away if you have bright red blood in your urine, or if you are unable to pass urine. This information is not intended to replace advice given to you by your health care provider. Make sure you discuss any questions you have with your health care provider. Document Revised: 06/13/2021 Document Reviewed: 06/13/2021 Elsevier Patient Education  2024 [employer]                       General Anesthesia, Adult, Care After The following information offers guidance on how to care for yourself after your procedure. Your health care provider may also give you more specific instructions. If you have problems or questions, contact your health care provider. What can I expect after the procedure? After the procedure, it is common for people to: Have pain or discomfort at the IV site. Have nausea or vomiting. Have a sore throat or hoarseness. Have trouble concentrating. Feel cold or chills. Feel weak, sleepy,  or tired (fatigue). Have soreness and body aches. These can affect parts  of the body that were not involved in surgery. Follow these instructions at home: For the time period you were told by your health care provider:  Rest. Do not participate in activities where you could fall or become injured. Do not drive or use machinery. Do not drink alcohol . Do not take sleeping pills or medicines that cause drowsiness. Do not make important decisions or sign legal documents. Do not take care of children on your own. General instructions Drink enough fluid to keep your urine pale yellow. If you have sleep apnea, surgery and certain medicines can increase your risk for breathing problems. Follow instructions from your health care provider about wearing your sleep device: Anytime you are sleeping, including during daytime naps. While taking prescription pain medicines, sleeping medicines, or medicines that make you drowsy. Return to your normal activities as told by your health care provider. Ask your health care provider what activities are safe for you. Take over-the-counter and prescription medicines only as told by your health care provider. Do not use any products that contain nicotine or tobacco. These products include cigarettes, chewing tobacco, and vaping devices, such as e-cigarettes. These can delay incision healing after surgery. If you need help quitting, ask your health care provider. Contact a health care provider if: You have nausea or vomiting that does not get better with medicine. You vomit every time you eat or drink. You have pain that does not get better with medicine. You cannot urinate or have bloody urine. You develop a skin rash. You have a fever. Get help right away if: You have trouble breathing. You have chest pain. You vomit blood. These symptoms may be an emergency. Get help right away. Call 911. Do not wait to see if the symptoms will go away. Do not drive yourself to the hospital. Summary After the procedure, it is common to have a  sore throat, hoarseness, nausea, vomiting, or to feel weak, sleepy, or fatigue. For the time period you were told by your health care provider, do not drive or use machinery. Get help right away if you have difficulty breathing, have chest pain, or vomit blood. These symptoms may be an emergency. This information is not intended to replace advice given to you by your health care provider. Make sure you discuss any questions you have with your health care provider. Document Revised: 03/06/2022 Document Reviewed: 03/06/2022 Elsevier Patient Education  2024 [employer] Inc.How to Use Chlorhexidine  at Home in the Shower Chlorhexidine  gluconate (CHG) is a germ-killing (antiseptic) wash that's used to clean the skin. It can get rid of the germs that normally live on the skin and can keep them away for about 24 hours. If you're having surgery, you may be told to shower with CHG at home the night before surgery. This can help lower your risk for infection. To use CHG wash in the shower, follow the steps below. Supplies needed: CHG body wash. Clean washcloth. Clean towel. How to use CHG in the shower Follow these steps unless you're told to use CHG in a different way: Start the shower. Use your normal soap and shampoo to wash your face and hair. Turn off the shower or move out of the shower stream. Pour CHG onto a clean washcloth. Do not use any type of brush or rough sponge. Start at your neck, washing your body down to your toes. Make sure you: Wash the part of your body  where the surgery will be done for at least 1 minute. Do not scrub. Do not use CHG on your head or face unless your health care provider tells you to. If it gets into your ears or eyes, rinse them well with water . Do not wash your genitals with CHG. Wash your back and under your arms. Make sure to wash skin folds. Let the CHG sit on your skin for 1-2 minutes or as long as told. Rinse your entire body in the shower, including all body  creases and folds. Turn off the shower. Dry off with a clean towel. Do not put anything on your skin afterward, such as powder, lotion, or perfume. Put on clean clothes or pajamas. If it's the night before surgery, sleep in clean sheets. General tips Use CHG only as told, and follow the instructions on the label. Use the full amount of CHG as told. This is often one bottle. Do not smoke and stay away from flames after using CHG. Your skin may feel sticky after using CHG. This is normal. The sticky feeling will go away as the CHG dries. Do not use CHG: If you have a chlorhexidine  allergy or have reacted to chlorhexidine  in the past. On open wounds or areas of skin that have broken skin, cuts, or scrapes. On babies younger than 68 months of age. Contact a health care provider if: You have questions about using CHG. Your skin gets irritated or itchy. You have a rash after using CHG. You swallow any CHG. Call your local poison control center [phone] in the [place] Your eyes itch badly, or they become very red or swollen. Your hearing changes. You have trouble seeing. If you can't reach your provider, go to an urgent care or emergency room. Do not drive yourself. Get help right away if: You have swelling or tingling in your mouth or throat. You make high-pitched whistling sounds when you breathe, most often when you breathe out (wheeze). You have trouble breathing. These symptoms may be an emergency. Call 911 right away. Do not wait to see if the symptoms will go away. Do not drive yourself to the hospital. This information is not intended to replace advice given to you by your health care provider. Make sure you discuss any questions you have with your health care provider. Document Revised: 06/22/2023 Document Reviewed: 06/18/2022 Elsevier Patient Education  2024 [employer] Inc.How to Use an Incentive Spirometer An incentive spirometer is a tool that measures how well you are  filling your lungs with each breath. Learning to take long, deep breaths using this tool can help you keep your lungs clear and active. This may help to reverse or lessen your chance of developing breathing (pulmonary) problems, especially infection. You may be asked to use a spirometer: After a surgery. If you have a lung problem or a history of smoking. After a long period of time when you have been unable to move or be active. If the spirometer includes an indicator to show the highest number that you have reached, your health care provider or respiratory therapist will help you set a goal. Keep a log of your progress as told by your health care provider. What are the risks? Breathing too quickly may cause dizziness or cause you to pass out. Take your time so you do not get dizzy or light-headed. If you are in pain, you may need to take pain medicine before doing incentive spirometry. It is harder to take a deep breath if  you are having pain. How to use your incentive spirometer  Sit up on the edge of your bed or on a chair. Hold the incentive spirometer so that it is in an upright position. Before you use the spirometer, breathe out normally. Place the mouthpiece in your mouth. Make sure your lips are closed tightly around it. Breathe in slowly and as deeply as you can through your mouth, causing the piston or the ball to rise toward the top of the chamber. Hold your breath for 3-5 seconds, or for as long as possible. If the spirometer includes a coach indicator, use this to guide you in breathing. Slow down your breathing if the indicator goes above the marked areas. Remove the mouthpiece from your mouth and breathe out normally. The piston or ball will return to the bottom of the chamber. Rest for a few seconds, then repeat the steps 10 or more times. Take your time and take a few normal breaths between deep breaths so that you do not get dizzy or light-headed. Do this every 1-2 hours when  you are awake. If the spirometer includes a goal marker to show the highest number you have reached (best effort), use this as a goal to work toward during each repetition. After each set of 10 deep breaths, cough a few times. This will help to make sure that your lungs are clear. If you have an incision on your chest or abdomen from surgery, place a pillow or a rolled-up towel firmly against the incision when you cough. This can help to reduce pain while taking deep breaths and coughing. General tips When you are able to get out of bed: Walk around often. Continue to take deep breaths and cough in order to clear your lungs. Keep using the incentive spirometer until your health care provider says it is okay to stop using it. If you have been in the hospital, you may be told to keep using the spirometer at home. Contact a health care provider if: You are having difficulty using the spirometer. You have trouble using the spirometer as often as instructed. Your pain medicine is not giving enough relief for you to use the spirometer as told. You have a fever. Get help right away if: You develop shortness of breath. You develop a cough with bloody mucus from the lungs. You have fluid or blood coming from an incision site after you cough. Summary An incentive spirometer is a tool that can help you learn to take long, deep breaths to keep your lungs clear and active. You may be asked to use a spirometer after a surgery, if you have a lung problem or a history of smoking, or if you have been inactive for a long period of time. Use your incentive spirometer as instructed every 1-2 hours while you are awake. If you have an incision on your chest or abdomen, place a pillow or a rolled-up towel firmly against your incision when you cough. This will help to reduce pain. Get help right away if you have shortness of breath, you cough up bloody mucus, or blood comes from your incision when you cough. This  information is not intended to replace advice given to you by your health care provider. Make sure you discuss any questions you have with your health care provider. Document Revised: 10/15/2023 Document Reviewed: 10/15/2023 Elsevier Patient Education  2024 [employer].

## 2024-06-26 ENCOUNTER — Encounter (HOSPITAL_COMMUNITY)
Admission: RE | Admit: 2024-06-26 | Discharge: 2024-06-26 | Disposition: A | Discharge status: Home / Self Care | Source: Ambulatory Visit | Attending: Urology | Admitting: Urology

## 2024-06-26 ENCOUNTER — Encounter (HOSPITAL_COMMUNITY): Payer: Self-pay

## 2024-06-26 VITALS — BP 143/62 | HR 68 | Resp 18 | Ht 68.0 in | Wt 254.4 lb

## 2024-06-26 DIAGNOSIS — E119 Type 2 diabetes mellitus without complications: Secondary | ICD-10-CM | POA: Diagnosis not present

## 2024-06-26 DIAGNOSIS — I10 Essential (primary) hypertension: Secondary | ICD-10-CM | POA: Diagnosis not present

## 2024-06-26 DIAGNOSIS — Z01818 Encounter for other preprocedural examination: Secondary | ICD-10-CM | POA: Insufficient documentation

## 2024-06-26 LAB — BASIC METABOLIC PANEL WITH GFR
Anion gap: 14 (ref 5–15)
BUN: 15 mg/dL (ref 8–23)
CO2: 21 mmol/L — ABNORMAL LOW (ref 22–32)
Calcium: 9.3 mg/dL (ref 8.9–10.3)
Chloride: 101 mmol/L (ref 98–111)
Creatinine, Ser: 0.8 mg/dL (ref 0.61–1.24)
GFR, Estimated: >60 mL/min (ref >60)
Glucose, Bld: 116 mg/dL — ABNORMAL HIGH (ref 70–99)
Potassium: 4.7 mmol/L (ref 3.5–5.1)
Sodium: 136 mmol/L (ref 135–145)

## 2024-06-26 LAB — HEMOGLOBIN A1C
Hgb A1c MFr Bld: 7.7 % — ABNORMAL HIGH (ref 4.8–5.6)
Mean Plasma Glucose: 174.29 mg/dL

## 2024-06-26 NOTE — Progress Notes (Signed)
   06/26/24 1147  OBSTRUCTIVE SLEEP APNEA  Have you ever been diagnosed with sleep apnea through a sleep study? No  Do you snore loudly (loud enough to be heard through closed doors)?  1  Do you often feel tired, fatigued, or sleepy during the daytime (such as falling asleep during driving or talking to someone)? 0  Has anyone observed you stop breathing during your sleep? 0  Do you have, or are you being treated for high blood pressure? 1  BMI more than 35 kg/m2? 1  Age > 50 (1-yes) 1  Neck circumference greater than:Male 16 inches or larger, Male 17inches or larger? 0  Male Gender (Yes=1) 1  Obstructive Sleep Apnea Score 5  Score 5 or greater  Results sent to PCP

## 2024-06-29 ENCOUNTER — Ambulatory Visit (HOSPITAL_COMMUNITY)
Admission: RE | Admit: 2024-06-29 | Discharge: 2024-06-29 | Disposition: A | Discharge status: Home / Self Care | Attending: Urology | Admitting: Urology

## 2024-06-29 ENCOUNTER — Ambulatory Visit (HOSPITAL_COMMUNITY): Admitting: Anesthesiology

## 2024-06-29 ENCOUNTER — Encounter (HOSPITAL_COMMUNITY): Payer: Self-pay | Admitting: Urology

## 2024-06-29 ENCOUNTER — Encounter (HOSPITAL_COMMUNITY)
Admission: RE | Disposition: A | Discharge status: Home / Self Care | Payer: Self-pay | Source: Home / Self Care | Attending: Urology

## 2024-06-29 ENCOUNTER — Other Ambulatory Visit: Payer: Self-pay

## 2024-06-29 DIAGNOSIS — R338 Other retention of urine: Secondary | ICD-10-CM | POA: Insufficient documentation

## 2024-06-29 DIAGNOSIS — E119 Type 2 diabetes mellitus without complications: Secondary | ICD-10-CM | POA: Diagnosis not present

## 2024-06-29 DIAGNOSIS — Z8711 Personal history of peptic ulcer disease: Secondary | ICD-10-CM | POA: Insufficient documentation

## 2024-06-29 DIAGNOSIS — Z7984 Long term (current) use of oral hypoglycemic drugs: Secondary | ICD-10-CM | POA: Insufficient documentation

## 2024-06-29 DIAGNOSIS — Z79899 Other long term (current) drug therapy: Secondary | ICD-10-CM | POA: Diagnosis not present

## 2024-06-29 DIAGNOSIS — Z87442 Personal history of urinary calculi: Secondary | ICD-10-CM | POA: Diagnosis not present

## 2024-06-29 DIAGNOSIS — Z7985 Long-term (current) use of injectable non-insulin antidiabetic drugs: Secondary | ICD-10-CM | POA: Diagnosis not present

## 2024-06-29 DIAGNOSIS — N319 Neuromuscular dysfunction of bladder, unspecified: Secondary | ICD-10-CM

## 2024-06-29 DIAGNOSIS — Z8546 Personal history of malignant neoplasm of prostate: Secondary | ICD-10-CM | POA: Insufficient documentation

## 2024-06-29 DIAGNOSIS — I10 Essential (primary) hypertension: Secondary | ICD-10-CM | POA: Diagnosis not present

## 2024-06-29 DIAGNOSIS — Z7982 Long term (current) use of aspirin: Secondary | ICD-10-CM | POA: Insufficient documentation

## 2024-06-29 DIAGNOSIS — N320 Bladder-neck obstruction: Secondary | ICD-10-CM | POA: Diagnosis not present

## 2024-06-29 DIAGNOSIS — N39498 Other specified urinary incontinence: Secondary | ICD-10-CM | POA: Diagnosis not present

## 2024-06-29 HISTORY — PX: CYSTOSCOPY WITH INJECTION: SHX1424

## 2024-06-29 LAB — GLUCOSE, CAPILLARY
Glucose-Capillary: 162 mg/dL — ABNORMAL HIGH (ref 70–99)
Glucose-Capillary: 142 mg/dL — ABNORMAL HIGH (ref 70–99)

## 2024-06-29 SURGERY — CYSTOSCOPY, WITH INJECTION OF BLADDER NECK OR BLADDER WALL
Anesthesia: General | Site: Bladder

## 2024-06-29 MED ORDER — PROPOFOL 10 MG/ML IV BOLUS
INTRAVENOUS | Status: DC | PRN
  Administered 2024-06-29: 150 mg via INTRAVENOUS

## 2024-06-29 MED ORDER — CHLORHEXIDINE GLUCONATE 0.12 % MT SOLN: 15.0000 mL | Freq: Once | OROMUCOSAL | Status: DC

## 2024-06-29 MED ORDER — LIDOCAINE 2% (20 MG/ML) 5 ML SYRINGE
INTRAMUSCULAR | Status: DC | PRN
  Administered 2024-06-29: 100 mg via INTRAVENOUS

## 2024-06-29 MED ORDER — ONABOTULINUMTOXINA 100 UNITS IJ SOLR
100.0000 [IU] | Freq: Once | INTRAMUSCULAR | Status: DC
  Filled 2024-06-29: qty 100

## 2024-06-29 MED ORDER — FENTANYL CITRATE (PF) 100 MCG/2ML IJ SOLN
INTRAMUSCULAR | Status: DC | PRN
  Administered 2024-06-29 (×2): 25 ug via INTRAVENOUS
  Administered 2024-06-29: 50 ug via INTRAVENOUS

## 2024-06-29 MED ORDER — MIDAZOLAM HCL 2 MG/2ML IJ SOLN
INTRAMUSCULAR | Status: AC
  Filled 2024-06-29: qty 2

## 2024-06-29 MED ORDER — ONABOTULINUMTOXINA 100 UNITS IJ SOLR
200.0000 [IU] | Freq: Once | INTRAMUSCULAR | Status: DC
  Filled 2024-06-29: qty 200

## 2024-06-29 MED ORDER — OXYCODONE HCL 5 MG PO TABS: 5.0000 mg | ORAL_TABLET | Freq: Once | ORAL | Status: DC | PRN

## 2024-06-29 MED ORDER — LACTATED RINGERS IV SOLN: INTRAVENOUS | Status: DC

## 2024-06-29 MED ORDER — OXYCODONE HCL 5 MG/5ML PO SOLN: 5.0000 mg | Freq: Once | ORAL | Status: DC | PRN

## 2024-06-29 MED ORDER — MIDAZOLAM HCL 2 MG/2ML IJ SOLN
INTRAMUSCULAR | Status: DC | PRN
Start: 2024-06-29 — End: 2024-06-29
  Administered 2024-06-29: 2 mg via INTRAVENOUS

## 2024-06-29 MED ORDER — CEFAZOLIN SODIUM-DEXTROSE 2-4 GM/100ML-% IV SOLN
2.0000 g | INTRAVENOUS | Status: AC
  Administered 2024-06-29: 2 g via INTRAVENOUS
  Filled 2024-06-29: qty 100

## 2024-06-29 MED ORDER — WATER FOR IRRIGATION, STERILE IR SOLN
Status: DC | PRN
  Administered 2024-06-29: 1000 mL

## 2024-06-29 MED ORDER — ORAL CARE MOUTH RINSE: 15.0000 mL | Freq: Once | OROMUCOSAL | Status: DC

## 2024-06-29 MED ORDER — FENTANYL CITRATE (PF) 100 MCG/2ML IJ SOLN
INTRAMUSCULAR | Status: AC
  Filled 2024-06-29: qty 2

## 2024-06-29 MED ORDER — DEXAMETHASONE SODIUM PHOSPHATE 10 MG/ML IJ SOLN
INTRAMUSCULAR | Status: DC | PRN
Start: 2024-06-29 — End: 2024-06-29
  Administered 2024-06-29: 10 mg via INTRAVENOUS

## 2024-06-29 MED ORDER — ONDANSETRON HCL 4 MG/2ML IJ SOLN: 4.0000 mg | Freq: Once | INTRAMUSCULAR | Status: DC | PRN

## 2024-06-29 MED ORDER — SODIUM CHLORIDE (PF) 0.9 % IJ SOLN
INTRAMUSCULAR | Status: AC
  Filled 2024-06-29: qty 10

## 2024-06-29 MED ORDER — ONDANSETRON HCL 4 MG/2ML IJ SOLN
INTRAMUSCULAR | Status: DC | PRN
  Administered 2024-06-29: 4 mg via INTRAVENOUS

## 2024-06-29 MED ORDER — ONABOTULINUMTOXINA 100 UNITS IJ SOLR
INTRAMUSCULAR | Status: DC | PRN
  Administered 2024-06-29: 200 [IU] via INTRAMUSCULAR

## 2024-06-29 MED ORDER — FENTANYL CITRATE PF 50 MCG/ML IJ SOSY: 25.0000 ug | PREFILLED_SYRINGE | INTRAMUSCULAR | Status: DC | PRN

## 2024-06-29 MED ORDER — SODIUM CHLORIDE (PF) 0.9 % IJ SOLN
INTRAMUSCULAR | Status: DC | PRN
  Administered 2024-06-29: 20 mL via INTRAVENOUS

## 2024-06-29 SURGICAL SUPPLY — 22 items
BAG DRAIN URO TABLE W/ADPT NS (BAG) ×1 IMPLANT
BAG HAMPER (MISCELLANEOUS) ×1 IMPLANT
CATH FOLEY 2WAY SLVR 5CC 20FR (CATHETERS) IMPLANT
CLOTH BEACON ORANGE TIMEOUT ST (SAFETY) ×1 IMPLANT
GLOVE BIO SURGEON STRL SZ8 (GLOVE) ×1 IMPLANT
GLOVE BIOGEL PI IND STRL 7.0 (GLOVE) ×2 IMPLANT
GOWN STRL REUS W/TWL LRG LVL3 (GOWN DISPOSABLE) ×1 IMPLANT
GOWN STRL REUS W/TWL XL LVL3 (GOWN DISPOSABLE) ×1 IMPLANT
KIT TURNOVER CYSTO (KITS) ×1 IMPLANT
MANIFOLD NEPTUNE II (INSTRUMENTS) ×1 IMPLANT
NDL ASPIRATION 22 (NEEDLE) ×1 IMPLANT
NDL HYPO 18GX1.5 BLUNT FILL (NEEDLE) ×1 IMPLANT
NEEDLE ASPIRATION 22 (NEEDLE) ×1 IMPLANT
NEEDLE HYPO 18GX1.5 BLUNT FILL (NEEDLE) ×1 IMPLANT
PACK CYSTO (CUSTOM PROCEDURE TRAY) ×1 IMPLANT
PAD ARMBOARD POSITIONER FOAM (MISCELLANEOUS) ×1 IMPLANT
POSITIONER HEAD 8X9X4 ADT (SOFTGOODS) ×1 IMPLANT
SYR 30ML LL (SYRINGE) ×1 IMPLANT
SYR CONTROL 10ML LL (SYRINGE) ×1 IMPLANT
TOWEL OR 17X26 4PK STRL BLUE (TOWEL DISPOSABLE) ×1 IMPLANT
WATER STERILE IRR 3000ML UROMA (IV SOLUTION) ×1 IMPLANT
WATER STERILE IRR 500ML POUR (IV SOLUTION) ×1 IMPLANT

## 2024-06-29 NOTE — H&P (Signed)
 History of Present Illness: [name] is a 78 y.o. adult who presents today for follow up visit at [facility].  GU History includes: 1. Prostate cancer. Underwent prostatectomy in 2004. 2. Neurogenic bladder with urinary retention and incontinence.  - Managed with chronic indwelling Foley catheter. - Leakage refractory to anticholinergic therapy; improved with intravesical Botox  previously. 3. Bladder neck contracture. 4. Kidney stones.   At last visit on 11/03/2023: Doing well after bladder Botox  procedure (200 units) by Dr. Sherrilee on 10/21/2023.    Today: He reports that his bladder spasms continue to be well controlled following the Botox  procedure; denies need to repeat that quite yet. He is doing well with exchanging at home approximately 1x/month.      Medications:       Current Outpatient Medications  Medication Sig Dispense Refill   amLODipine-benazepril (LOTREL) 5-20 MG per capsule Take 1 capsule by mouth every morning.       aspirin EC 81 MG tablet Take 81 mg by mouth daily. Swallow whole.       cholecalciferol (VITAMIN D3) 25 MCG (1000 UNIT) tablet Take 1,000 Units by mouth 2 (two) times daily.       empagliflozin (JARDIANCE) 25 MG TABS tablet Take 25 mg by mouth daily.       furosemide  (LASIX ) 20 MG tablet Take 20 mg by mouth daily.       gabapentin (NEURONTIN) 600 MG tablet Take 600 mg by mouth 3 (three) times daily.       ibuprofen (ADVIL) 200 MG tablet Take 400 mg by mouth every 6 (six) hours as needed for moderate pain or headache.       metoprolol tartrate (LOPRESSOR) 25 MG tablet Take 25 mg by mouth 2 (two) times daily.       Multiple Vitamins-Minerals (CENTRUM SILVER ADULT 50+ PO) Take 1 tablet by mouth daily.       omeprazole (PRILOSEC) 20 MG capsule Take 20 mg by mouth daily.       OZEMPIC, 1 MG/DOSE, 4 MG/3ML SOPN Inject 1 mg into the skin once a week.       primidone  (MYSOLINE ) 50 MG tablet Take 1 tablet (50 mg total) by mouth 2 (two)  times daily. 180 tablet 3   rosuvastatin (CRESTOR) 10 MG tablet Take 10 mg by mouth at bedtime.       Semaglutide, 1 MG/DOSE, (OZEMPIC, 1 MG/DOSE,) 2 MG/1.5ML SOPN Inject 1 mg into the skin every Sunday.          No current facility-administered medications for this visit.        Allergies: Allergies      Allergies  Allergen Reactions   Sulfa Antibiotics Itching and Rash            Past Medical History:  Diagnosis Date   Arthritis     Bladder neck contracture     Complication of anesthesia      Difficult intubation   Diabetes mellitus without complication (HCC)     Difficult intubation     Foley catheter in place     GERD (gastroesophageal reflux disease)     History of gastric ulcer      2007   History of kidney stones     History of prostate cancer      20 04--  S/P PROSTATECTOMY   Hypertension     Urinary incontinence     Wears glasses  Past Surgical History:  Procedure Laterality Date   BALLOON DILATION N/A 09/12/2013    Procedure: CYSTO BALLOON DILATION/RETROGRADE URETHROGRAM;  Surgeon: Glendia DELENA Elizabeth, MD;  Location: [facility];  Service: Urology;  Laterality: N/A;   BOTOX  INJECTION N/A 01/08/2022    Procedure: BOTOX  INJECTION;  Surgeon: Sherrilee Belvie CROME, MD;  Location: [facility];  Service: Urology;  Laterality: N/A;   BOTOX  INJECTION N/A 08/13/2022    Procedure: BOTOX  INJECTION;  Surgeon: Sherrilee Belvie CROME, MD;  Location: [facility];  Service: Urology;  Laterality: N/A;   BOTOX  INJECTION N/A 04/15/2023    Procedure: BOTOX  INJECTION;  Surgeon: Sherrilee Belvie CROME, MD;  Location: [facility];  Service: Urology;  Laterality: N/A;   CYSTO/  URETHRAL DILATION/ BLADDER BX/ BILATERAL RETROGRADE PYELOGRAM   03-03-2010   CYSTO/ BALLOON DILATION AND VAPORIZATION BLADDER NECK & PROSTATIC FOSSA   04-28-2010   CYSTO/ BALLOON DILATION BLADDER NECK CONTRACTURE   07-23-2011;   11-17-2010;  08-08-2010   CYSTOSCOPY N/A 01/08/2022    Procedure: CYSTOSCOPY  WITH INTRAVESICAL BOTOX  100 UNITS;  Surgeon: Sherrilee Belvie CROME, MD;  Location: [facility];  Service: Urology;  Laterality: N/A;   CYSTOSCOPY N/A 04/15/2023    Procedure: CYSTOSCOPY WITH FULGURATION OF BLEEDING;  Surgeon: Sherrilee Belvie CROME, MD;  Location: [facility];  Service: Urology;  Laterality: N/A;   CYSTOSCOPY WITH DIRECT VISION INTERNAL URETHROTOMY N/A 04/24/2021    Procedure: CYSTOSCOPY WITH DIRECT VISION INTERNAL URETHROTOMY;  Surgeon: Sherrilee Belvie CROME, MD;  Location: [facility];  Service: Urology;  Laterality: N/A;   CYSTOSCOPY WITH INJECTION N/A 08/13/2022    Procedure: CYSTOSCOPY WITH INJECTION;  Surgeon: Sherrilee Belvie CROME, MD;  Location: [facility];  Service: Urology;  Laterality: N/A;   CYSTOSCOPY WITH INJECTION N/A 10/21/2023    Procedure: CYSTOSCOPY WITH INJECTION- Botox ;  Surgeon: Sherrilee Belvie CROME, MD;  Location: [facility];  Service: Urology;  Laterality: N/A;   FULGURATION OF LESION   04/24/2021    Procedure: FULGURATION OF BLADDER NECK;  Surgeon: Sherrilee Belvie CROME, MD;  Location: [facility];  Service: Urology;;   MITOMYCIN  C INJECTION   04/24/2021    Procedure: MITOMYCIN  C INJECTION ANTERIOR BLADDER WALL;  Surgeon: Sherrilee Belvie CROME, MD;  Location: [facility];  Service: Urology;;   PROSTATECTOMY   2004    RADICAL   TONSILLECTOMY   AS CHILD   TRANSURETHRAL INCISION AND RESECTION OF BLADDER NECK CONTRACTURE   09-01-2010   UMBILICAL HERNIA REPAIR                 Family History  Problem Relation Age of Onset   Heart attack Mother     Heart attack Father          Social History         Socioeconomic History   Marital status: Married      Spouse name: Edna   Number of children: Not on file   Years of education: Not on file   Highest education level: Not on file  Occupational History   Not on file  Tobacco Use   Smoking status: Never   Smokeless tobacco: Never  Vaping Use   Vaping status: Never Used  Substance and Sexual Activity   Alcohol  use: Yes      Alcohol /week: 6.0  standard drinks of alcohol       Types: 6 Cans of beer per week      Comment: occasionally   Drug use: No   Sexual activity: Not Currently  Other  Topics Concern   Not on file  Social History Narrative   Not on file    Social Drivers of Health        Financial Resource Strain: Not on file  Food Insecurity: No Food Insecurity (02/16/2024)    Hunger Vital Sign     Worried About Running Out of Food in the Last Year: Never true     Ran Out of Food in the Last Year: Never true  Transportation Needs: No Transportation Needs (02/16/2024)    PRAPARE - Therapist, art (Medical): No     Lack of Transportation (Non-Medical): No  Physical Activity: Not on file  Stress: Not on file  Social Connections: Not on file  Intimate Partner Violence: Not At Risk (02/16/2024)    Humiliation, Afraid, Rape, and Kick questionnaire     Fear of Current or Ex-Partner: No     Emotionally Abused: No     Physically Abused: No     Sexually Abused: No      Review of Systems Constitutional: Patient denies any unintentional weight loss or change in strength lntegumentary: Patient denies any rashes or pruritus Cardiovascular: Patient denies chest pain or syncope Respiratory: Patient denies shortness of breath Musculoskeletal: Patient denies muscle cramps or weakness Neurologic: Patient denies convulsions or seizures Allergic/Immunologic: Patient denies recent allergic reaction(s) Hematologic/Lymphatic: Patient denies bleeding tendencies Endocrine: Patient denies heat/cold intolerance   GU: As per HPI.   OBJECTIVE    Vitals:    03/31/24 1108  BP: 118/75  Pulse: 65    There is no height or weight on file to calculate BMI.   Physical Examination Constitutional: No obvious distress; patient is non-toxic appearing  Cardiovascular: No visible lower extremity edema.  Respiratory: The patient does not have audible wheezing/stridor; respirations do not appear labored   Gastrointestinal: Abdomen non-distended Musculoskeletal: Normal ROM of UEs  Skin: No obvious rashes/open sores  Neurologic: CN 2-12 grossly intact Psychiatric: Answered questions appropriately with normal affect  Hematologic/Lymphatic/Immunologic: No obvious bruises or sites of spontaneous bleeding   ASSESSMENT Neurogenic bladder - Plan: POCT urinalysis dipstick, CANCELED: BLADDER SCAN AMB NON-IMAGING   He is doing well. We agreed to plan for follow up in 3 months or sooner if needed. He was advised to notify Urology if he would like to repeat Botox  prior to next visit so that surgery request can be submitted with Dr. Sherrilee. Patient verbalized understanding of and agreement with current plan. All questions were answered.   PLAN The risks/benefits/alternatives to intravesical botox  was explained to the patient and he understands and wishes to proceed with surgery

## 2024-06-29 NOTE — Anesthesia Preprocedure Evaluation (Signed)
Anesthesia Evaluation  Patient identified by MRN, date of birth, ID band Patient awake    Reviewed: Allergy & Precautions, H&P , NPO status , Patient's Chart, lab work & pertinent test results, reviewed documented beta blocker date and time   History of Anesthesia Complications (+) DIFFICULT AIRWAY and history of anesthetic complications  Airway Mallampati: II  TM Distance: >3 FB Neck ROM: full    Dental no notable dental hx. (+)  Many missing:   Pulmonary neg pulmonary ROS   Pulmonary exam normal breath sounds clear to auscultation       Cardiovascular Exercise Tolerance: Good hypertension, negative cardio ROS Normal cardiovascular exam Rhythm:regular Rate:Normal     Neuro/Psych negative neurological ROS  negative psych ROS   GI/Hepatic negative GI ROS, Neg liver ROS,GERD  ,,  Endo/Other  negative endocrine ROSdiabetes    Renal/GU negative Renal ROS  negative genitourinary   Musculoskeletal negative musculoskeletal ROS (+) Arthritis ,    Abdominal   Peds  Hematology negative hematology ROS (+)   Anesthesia Other Findings   Reproductive/Obstetrics negative OB ROS                              Anesthesia Physical Anesthesia Plan  ASA: 2  Anesthesia Plan: General   Post-op Pain Management:    Induction: Intravenous  PONV Risk Score and Plan:   Airway Management Planned: LMA  Additional Equipment:   Intra-op Plan:   Post-operative Plan:   Informed Consent: I have reviewed the patients History and Physical, chart, labs and discussed the procedure including the risks, benefits and alternatives for the proposed anesthesia with the patient or authorized representative who has indicated his/her understanding and acceptance.     Dental Advisory Given  Plan Discussed with: CRNA  Anesthesia Plan Comments:          Anesthesia Quick Evaluation

## 2024-06-29 NOTE — Transfer of Care (Signed)
 Immediate Anesthesia Transfer of Care Note  Patient: Marc Burns  Procedure(s) Performed: CYSTOSCOPY, WITH INJECTION OF BLADDER NECK OR BLADDER WALL (Bladder)  Patient Location: PACU  Anesthesia Type:General  Level of Consciousness: awake, alert , oriented, and patient cooperative  Airway & Oxygen  Therapy: Patient Spontanous Breathing and Patient connected to face mask oxygen   Post-op Assessment: Report given to RN, Post -op Vital signs reviewed and stable, and Patient moving all extremities X 4  Post vital signs: Reviewed and stable  Last Vitals:  Vitals Value Taken Time  BP 124/56 06/29/24 10:04  Temp    Pulse 72 06/29/24 10:10  Resp 13 06/29/24 10:10  SpO2 97 % 06/29/24 10:10  Vitals shown include unfiled device data.  Last Pain:  Vitals:   06/29/24 0725  TempSrc: Oral  PainSc: 0-No pain         Complications: No notable events documented.

## 2024-06-29 NOTE — Anesthesia Procedure Notes (Signed)
 Procedure Name: LMA Insertion Date/Time: 06/29/2024 9:33 AM  Performed by: Cordella Elvie HERO, CRNAPre-anesthesia Checklist: Patient identified, Emergency Drugs available, Suction available, Patient being monitored and Timeout performed Patient Re-evaluated:Patient Re-evaluated prior to induction Oxygen  Delivery Method: Circle system utilized Preoxygenation: Pre-oxygenation with 100% oxygen  Induction Type: IV induction LMA: LMA inserted LMA Size: 4.0 Number of attempts: 1 Placement Confirmation: positive ETCO2, CO2 detector and breath sounds checked- equal and bilateral Tube secured with: Tape Dental Injury: Teeth and Oropharynx as per pre-operative assessment

## 2024-06-29 NOTE — Op Note (Signed)
 Preoperative diagnosis: neurogenic bladder   Postoperative diagnosis: same   Procedure: 1 cystoscopy 2. Intravesical botox  injection 200 units   Attending: Belvie Clara   Anesthesia: General   Estimated blood loss: Minimal   Drains: 20 french foley   Specimens: none   Antibiotics: ancef    Findings:  Ureteral orifices in normal anatomic location.  No masses/lesions in the bladder   Indications: Patient is a 78 year old male with a history of neurogenic bladder and urinary leakage refractory to anticholinergic therapy.  After discussing treatment options, they decided proceed with intravesical botox  injection.   Procedure in detail: The patient was brought to the operating room and a brief timeout was done to ensure correct patient, correct procedure, correct site.  General anesthesia was administered patient was placed in dorsal lithotomy position.  Their genitalia was then prepped and draped in usual sterile fashion.  A rigid 22 French cystoscope was passed in the urethra and the bladder.  Bladder was inspected and we noted no masses or lesions.  the ureteral orifices were in the normal orthotopic locations. Using a deflux injection needle we proceed to inject 200 units in a grid pattern between the ureteral orifices starting at the trigone to the mid posterior wall. We injected a total of 20 sites with 10 units per injection. The bladder was then drained and this concluded the procedure which was well tolerated by patient.   Complications: None   Condition: Stable, extubated, transferred to PACU   Plan: Patient is to be discharge home. He will followup in 2-3 weeks

## 2024-06-30 ENCOUNTER — Ambulatory Visit: Admitting: Urology

## 2024-06-30 ENCOUNTER — Encounter (HOSPITAL_COMMUNITY): Payer: Self-pay | Admitting: Urology

## 2024-06-30 NOTE — Anesthesia Postprocedure Evaluation (Signed)
 Anesthesia Post Note  Patient: Marc Burns  Procedure(s) Performed: CYSTOSCOPY, WITH INJECTION OF BLADDER NECK OR BLADDER WALL (Bladder)  Patient location during evaluation: Phase II Anesthesia Type: General Level of consciousness: awake Pain management: pain level controlled Vital Signs Assessment: post-procedure vital signs reviewed and stable Respiratory status: spontaneous breathing and respiratory function stable Cardiovascular status: blood pressure returned to baseline and stable Postop Assessment: no headache and no apparent nausea or vomiting Anesthetic complications: no Comments: Late entry   No notable events documented.   Last Vitals:  Vitals:   06/29/24 1031 06/29/24 1045  BP: (!) 120/107 (!) 148/71  Pulse: 67 65  Resp: (!) 21 16  Temp:  36.5 C  SpO2: 95% 98%    Last Pain:  Vitals:   06/30/24 1252  TempSrc:   PainSc: 0-No pain                 Yvonna JINNY Bosworth

## 2024-07-11 NOTE — Progress Notes (Unsigned)
 Name: Marc Burns DOB: May 04, 1946 MRN: [id number]  Diagnoses: Post-operative state  HPI: Marc Burns presents post-operatively.  Relevant History includes: 1. Prostate cancer. Underwent prostatectomy in 2004. 2. Neurogenic bladder with urinary retention and incontinence.  - Managed with chronic indwelling Foley catheter. - Leakage refractory to anticholinergic therapy; improved with intravesical Botox  previously. 3. Bladder neck contracture. 4. Kidney stones.  He presents s/p the following procedures by Dr. Sherrilee on 06/29/2024:  Preoperative diagnosis: neurogenic bladder   Postoperative diagnosis: same   Procedure:  1. cystoscopy 2. Intravesical botox  injection 200 units  Postop course: Today: He {Actions; denies-reports:120008} symptomatic improvement since the bladder Botox  procedure.  He reports {Blank multiple:19197::improved,persistent / unchanged} urinary ***frequency, ***nocturia x***, ***urgency, and ***urge incontinence.  Voiding ***x/day and ***x/night on average.  Leaking ***x/day on average; using *** ***pads / ***diapers per day on average.  He {Actions; denies-reports:120008} dysuria, gross hematuria, straining to void, or sensations of incomplete emptying. He {Actions; denies-reports:120008} flank pain, abdominal pain, fevers, nausea, or vomiting.  Medications: Current Outpatient Medications  Medication Sig Dispense Refill   amLODipine-benazepril (LOTREL) 5-20 MG per capsule Take 1 capsule by mouth every morning.     aspirin EC 81 MG tablet Take 81 mg by mouth daily. Swallow whole.     Cholecalciferol (VITAMIN D) 50 MCG (2000 UT) CAPS Take 2,000 Units by mouth 2 (two) times daily.     clotrimazole -betamethasone  (LOTRISONE ) cream APPLY  CREAM TOPICALLY TO AFFECTED AREA TWICE DAILY AS NEEDED FOR ITCHING 30 g 0   empagliflozin (JARDIANCE) 25 MG TABS tablet Take 25 mg by mouth daily.     furosemide  (LASIX ) 20 MG tablet Take 20 mg by mouth daily.      gabapentin (NEURONTIN) 600 MG tablet Take 600 mg by mouth 3 (three) times daily.     ibuprofen (ADVIL) 200 MG tablet Take 400 mg by mouth every 6 (six) hours as needed for moderate pain or headache.     metoprolol tartrate (LOPRESSOR) 25 MG tablet Take 25 mg by mouth 2 (two) times daily.     Multiple Vitamins-Minerals (CENTRUM SILVER ADULT 50+ PO) Take 1 tablet by mouth daily.     OZEMPIC, 1 MG/DOSE, 4 MG/3ML SOPN Inject 1 mg into the skin once a week.     primidone  (MYSOLINE ) 50 MG tablet Take 1 tablet (50 mg total) by mouth 3 (three) times daily. 270 tablet 3   rosuvastatin (CRESTOR) 10 MG tablet Take 10 mg by mouth at bedtime.     No current facility-administered medications for this visit.    Allergies: Allergies  Allergen Reactions   Sulfa Antibiotics Itching and Rash    Past Medical History:  Diagnosis Date   Arthritis    Bladder neck contracture    Complication of anesthesia    Difficult intubation   Diabetes mellitus without complication (HCC)    Difficult intubation    Foley catheter in place    GERD (gastroesophageal reflux disease)    History of gastric ulcer    2007   History of kidney stones    History of prostate cancer    2004--  S/P PROSTATECTOMY   Hypertension    Urinary incontinence    Wears glasses    Past Surgical History:  Procedure Laterality Date   BALLOON DILATION N/A 09/12/2013   Procedure: CYSTO BALLOON DILATION/RETROGRADE URETHROGRAM;  Surgeon: Glendia DELENA Elizabeth, MD;  Location: [facility] [place];  Service: Urology;  Laterality: N/A;   BOTOX  INJECTION N/A 01/08/2022  Procedure: BOTOX  INJECTION;  Surgeon: Sherrilee Belvie CROME, MD;  Location: [facility];  Service: Urology;  Laterality: N/A;   BOTOX  INJECTION N/A 08/13/2022   Procedure: BOTOX  INJECTION;  Surgeon: Sherrilee Belvie CROME, MD;  Location: [facility];  Service: Urology;  Laterality: N/A;   BOTOX  INJECTION N/A 04/15/2023   Procedure: BOTOX  INJECTION;  Surgeon: Sherrilee Belvie CROME, MD;   Location: [facility];  Service: Urology;  Laterality: N/A;   CYSTO/  URETHRAL DILATION/ BLADDER BX/ BILATERAL RETROGRADE PYELOGRAM  03-03-2010   CYSTO/ BALLOON DILATION AND VAPORIZATION BLADDER NECK & PROSTATIC FOSSA  04-28-2010   CYSTO/ BALLOON DILATION BLADDER NECK CONTRACTURE  07-23-2011;   11-17-2010;  08-08-2010   CYSTOSCOPY N/A 01/08/2022   Procedure: CYSTOSCOPY WITH INTRAVESICAL BOTOX  100 UNITS;  Surgeon: Sherrilee Belvie CROME, MD;  Location: [facility];  Service: Urology;  Laterality: N/A;   CYSTOSCOPY N/A 04/15/2023   Procedure: CYSTOSCOPY WITH FULGURATION OF BLEEDING;  Surgeon: Sherrilee Belvie CROME, MD;  Location: [facility];  Service: Urology;  Laterality: N/A;   CYSTOSCOPY WITH DIRECT VISION INTERNAL URETHROTOMY N/A 04/24/2021   Procedure: CYSTOSCOPY WITH DIRECT VISION INTERNAL URETHROTOMY;  Surgeon: Sherrilee Belvie CROME, MD;  Location: [facility];  Service: Urology;  Laterality: N/A;   CYSTOSCOPY WITH INJECTION N/A 08/13/2022   Procedure: CYSTOSCOPY WITH INJECTION;  Surgeon: Sherrilee Belvie CROME, MD;  Location: [facility];  Service: Urology;  Laterality: N/A;   CYSTOSCOPY WITH INJECTION N/A 10/21/2023   Procedure: CYSTOSCOPY WITH INJECTION- Botox ;  Surgeon: Sherrilee Belvie CROME, MD;  Location: [facility];  Service: Urology;  Laterality: N/A;   CYSTOSCOPY WITH INJECTION N/A 06/29/2024   Procedure: CYSTOSCOPY, WITH INJECTION OF BLADDER NECK OR BLADDER WALL;  Surgeon: Sherrilee Belvie CROME, MD;  Location: [facility];  Service: Urology;  Laterality: N/A;   FULGURATION OF LESION  04/24/2021   Procedure: FULGURATION OF BLADDER NECK;  Surgeon: Sherrilee Belvie CROME, MD;  Location: [facility];  Service: Urology;;   MITOMYCIN  C INJECTION  04/24/2021   Procedure: MITOMYCIN  C INJECTION ANTERIOR BLADDER WALL;  Surgeon: Sherrilee Belvie CROME, MD;  Location: [facility];  Service: Urology;;   PROSTATECTOMY  2004   RADICAL   TONSILLECTOMY  AS CHILD   TRANSURETHRAL INCISION AND RESECTION OF BLADDER NECK CONTRACTURE  09-01-2010   UMBILICAL HERNIA REPAIR      Family History  Problem Relation Age of Onset   Heart attack Mother    Heart attack Father    Social History   Socioeconomic History   Marital status: Married    Spouse name: Edna   Number of children: Not on file   Years of education: Not on file   Highest education level: Not on file  Occupational History   Not on file  Tobacco Use   Smoking status: Never   Smokeless tobacco: Never  Vaping Use   Vaping status: Never Used  Substance and Sexual Activity   Alcohol  use: Yes    Alcohol /week: 6.0 standard drinks of alcohol     Types: 6 Cans of beer per week    Comment: occasionally   Drug use: No   Sexual activity: Not Currently  Other Topics Concern   Not on file  Social History Narrative   Not on file   Social Drivers of Health   Financial Resource Strain: Not on file  Food Insecurity: No Food Insecurity (02/16/2024)   Hunger Vital Sign    Worried About Running Out of Food in the Last Year: Never true    Ran Out of Food  in the Last Year: Never true  Transportation Needs: No Transportation Needs (02/16/2024)   PRAPARE - Administrator, Civil Service (Medical): No    Lack of Transportation (Non-Medical): No  Physical Activity: Not on file  Stress: Not on file  Social Connections: Not on file  Intimate Partner Violence: Not At Risk (02/16/2024)   Humiliation, Afraid, Rape, and Kick questionnaire    Fear of Current or Ex-Partner: No    Emotionally Abused: No    Physically Abused: No    Sexually Abused: No    SUBJECTIVE  Review of Systems Constitutional: Patient denies any unintentional weight loss or change in strength lntegumentary: Patient denies any rashes or pruritus Cardiovascular: Patient denies chest pain or syncope Respiratory: Patient denies shortness of breath Gastrointestinal: ***Patient denies nausea, vomiting, constipation, or diarrhea ***As per HPI Musculoskeletal: Patient denies muscle cramps or weakness Neurologic: Patient denies  convulsions or seizures Allergic/Immunologic: Patient denies recent allergic reaction(s) Hematologic/Lymphatic: Patient denies bleeding tendencies Endocrine: Patient denies heat/cold intolerance  GU: As per HPI.  OBJECTIVE There were no vitals filed for this visit. There is no height or weight on file to calculate BMI.  Physical Examination Constitutional: No obvious distress; patient is non-toxic appearing  Cardiovascular: No visible lower extremity edema.  Respiratory: The patient does not have audible wheezing/stridor; respirations do not appear labored  Gastrointestinal: Abdomen non-distended Musculoskeletal: Normal ROM of UEs  Skin: No obvious rashes/open sores  Neurologic: CN 2-12 grossly intact Psychiatric: Answered questions appropriately with normal affect  Hematologic/Lymphatic/Immunologic: No obvious bruises or sites of spontaneous bleeding  UA: ***negative ***positive for *** leukocytes, *** blood, ***nitrites Urine microscopy: *** WBC/hpf, *** RBC/hpf, *** bacteria ***glucosuria (secondary to ***Jardiance ***Farxiga use) ***otherwise unremarkable  PVR: *** ml  ASSESSMENT No diagnosis found. *** We reviewed the operative procedures and findings.  Pre-operative symptoms are *** since the procedure.   We agreed to plan for follow up in ***6 months or sooner if needed. Patient verbalized understanding of and agreement with current plan. All questions were answered.  PLAN Advised the following: *** ***No follow-ups on file.  ***No global period (charge regular E&M) - Cystoscopy w/ bladder Botox   No orders of the defined types were placed in this encounter.   It has been explained that the patient is to follow regularly with their PCP in addition to all other providers involved in their care and to follow instructions provided by these respective offices. Patient advised to contact urology clinic if any urologic-pertaining questions, concerns, new symptoms or  problems arise in the interim period.  There are no Patient Instructions on file for this visit.  Electronically signed by:  Lauraine KYM Oz, MSN, FNP-C, CUNP 07/11/2024 1:34 PM

## 2024-07-12 ENCOUNTER — Ambulatory Visit: Admitting: Urology

## 2024-07-12 ENCOUNTER — Encounter: Payer: Self-pay | Admitting: Urology

## 2024-07-12 VITALS — BP 119/73 | HR 64

## 2024-07-12 DIAGNOSIS — Z09 Encounter for follow-up examination after completed treatment for conditions other than malignant neoplasm: Secondary | ICD-10-CM

## 2024-07-12 DIAGNOSIS — R32 Unspecified urinary incontinence: Secondary | ICD-10-CM

## 2024-07-12 DIAGNOSIS — N319 Neuromuscular dysfunction of bladder, unspecified: Secondary | ICD-10-CM

## 2024-07-20 DIAGNOSIS — E559 Vitamin D deficiency, unspecified: Secondary | ICD-10-CM | POA: Diagnosis not present

## 2024-07-20 DIAGNOSIS — E782 Mixed hyperlipidemia: Secondary | ICD-10-CM | POA: Diagnosis not present

## 2024-07-20 DIAGNOSIS — E1169 Type 2 diabetes mellitus with other specified complication: Secondary | ICD-10-CM | POA: Diagnosis not present

## 2024-08-18 DIAGNOSIS — E559 Vitamin D deficiency, unspecified: Secondary | ICD-10-CM | POA: Diagnosis not present

## 2024-08-18 DIAGNOSIS — E782 Mixed hyperlipidemia: Secondary | ICD-10-CM | POA: Diagnosis not present

## 2024-08-18 DIAGNOSIS — E1169 Type 2 diabetes mellitus with other specified complication: Secondary | ICD-10-CM | POA: Diagnosis not present

## 2024-08-28 DIAGNOSIS — L570 Actinic keratosis: Secondary | ICD-10-CM | POA: Diagnosis not present

## 2024-08-29 ENCOUNTER — Inpatient Hospital Stay: Attending: Oncology

## 2024-08-29 DIAGNOSIS — D72829 Elevated white blood cell count, unspecified: Secondary | ICD-10-CM | POA: Insufficient documentation

## 2024-08-29 DIAGNOSIS — K921 Melena: Secondary | ICD-10-CM | POA: Diagnosis not present

## 2024-08-29 DIAGNOSIS — D696 Thrombocytopenia, unspecified: Secondary | ICD-10-CM | POA: Insufficient documentation

## 2024-08-29 LAB — CBC WITH DIFFERENTIAL/PLATELET
Abs Immature Granulocytes: 0.06 K/uL (ref 0.00–0.07)
Basophils Absolute: 0.1 K/uL (ref 0.0–0.1)
Basophils Relative: 1 %
Eosinophils Absolute: 0.5 K/uL (ref 0.0–0.5)
Eosinophils Relative: 4 %
HCT: 49.2 % (ref 39.0–52.0)
Hemoglobin: 15.7 g/dL (ref 13.0–17.0)
Immature Granulocytes: 1 %
Lymphocytes Relative: 13 %
Lymphs Abs: 1.5 K/uL (ref 0.7–4.0)
MCH: 29.1 pg (ref 26.0–34.0)
MCHC: 31.9 g/dL (ref 30.0–36.0)
MCV: 91.3 fL (ref 80.0–100.0)
Monocytes Absolute: 1.2 K/uL — ABNORMAL HIGH (ref 0.1–1.0)
Monocytes Relative: 10 %
Neutro Abs: 8.1 K/uL — ABNORMAL HIGH (ref 1.7–7.7)
Neutrophils Relative %: 71 %
Platelets: 142 K/uL — ABNORMAL LOW (ref 150–400)
RBC: 5.39 MIL/uL (ref 4.22–5.81)
RDW: 15.2 % (ref 11.5–15.5)
WBC: 11.4 K/uL — ABNORMAL HIGH (ref 4.0–10.5)
nRBC: 0 % (ref 0.0–0.2)

## 2024-08-29 LAB — COMPREHENSIVE METABOLIC PANEL WITH GFR
ALT: 29 U/L (ref 0–44)
AST: 27 U/L (ref 15–41)
Albumin: 4.2 g/dL (ref 3.5–5.0)
Alkaline Phosphatase: 49 U/L (ref 38–126)
Anion gap: 13 (ref 5–15)
BUN: 15 mg/dL (ref 8–23)
CO2: 23 mmol/L (ref 22–32)
Calcium: 9.4 mg/dL (ref 8.9–10.3)
Chloride: 103 mmol/L (ref 98–111)
Creatinine, Ser: 0.92 mg/dL (ref 0.61–1.24)
GFR, Estimated: >60 mL/min (ref >60)
Glucose, Bld: 137 mg/dL — ABNORMAL HIGH (ref 70–99)
Potassium: 4.3 mmol/L (ref 3.5–5.1)
Sodium: 139 mmol/L (ref 135–145)
Total Bilirubin: 0.8 mg/dL (ref 0.0–1.2)
Total Protein: 7.7 g/dL (ref 6.5–8.1)

## 2024-08-31 ENCOUNTER — Other Ambulatory Visit: Payer: Self-pay | Admitting: Urology

## 2024-08-31 LAB — SURGICAL PATHOLOGY

## 2024-09-01 LAB — FLOW CYTOMETRY

## 2024-09-03 LAB — BCR-ABL1, CML/ALL, PCR, QUANT
E1A2 Transcript: <0.0032 %
Interpretation (BCRAL):: NEGATIVE
b2a2 transcript: <0.0032 %
b3a2 transcript: <0.0032 %

## 2024-09-05 ENCOUNTER — Inpatient Hospital Stay (HOSPITAL_BASED_OUTPATIENT_CLINIC_OR_DEPARTMENT_OTHER): Admitting: Oncology

## 2024-09-05 VITALS — BP 113/57 | HR 61 | Temp 98.4°F | Resp 18 | Wt 249.0 lb

## 2024-09-05 DIAGNOSIS — D72829 Elevated white blood cell count, unspecified: Secondary | ICD-10-CM

## 2024-09-05 DIAGNOSIS — K921 Melena: Secondary | ICD-10-CM | POA: Diagnosis not present

## 2024-09-05 DIAGNOSIS — D696 Thrombocytopenia, unspecified: Secondary | ICD-10-CM | POA: Diagnosis not present

## 2024-09-05 NOTE — Assessment & Plan Note (Addendum)
 Intermittent thrombocytopenia since 2011. Most recent platelet count is 142.  He denies easy bruising or bleeding. Continue to monitor.

## 2024-09-05 NOTE — Assessment & Plan Note (Addendum)
 Does not report any more episodes.  Follows with Dr. Marletta Lor and refused endoscopy and colonoscopy.

## 2024-09-05 NOTE — Assessment & Plan Note (Addendum)
 Chronic leukocytosis with WBC count of 11.5, previously 14. Asymptomatic. Differential includes CLL but patient has no B symptoms.  LDH, ESR and CRP normal.  Flow cytometry negative.  No abnormal B or T-cell population identified.  - Repeat labs in 6 months.  - Continue to monitor counts - Instructed him to report new symptoms such as fever, chills, weakness, night sweats, or unusual swellings.

## 2024-09-05 NOTE — Progress Notes (Signed)
 Marc Burns OFFICE PROGRESS NOTE  Marc Vaughn FALCON, MD  ASSESSMENT & PLAN:    Assessment & Plan Thrombocytopenia (HCC) Intermittent thrombocytopenia since 2011. Most recent platelet count is 142.  He denies easy bruising or bleeding. Continue to monitor. Leukocytosis, unspecified type Chronic leukocytosis with WBC count of 11.5, previously 14. Asymptomatic. Differential includes CLL but patient has no B symptoms.  LDH, ESR and CRP normal.  Flow cytometry negative.  No abnormal B or T-cell population identified.  - Repeat labs in 6 months.  - Continue to monitor counts - Instructed him to report new symptoms such as fever, chills, weakness, night sweats, or unusual swellings. Melena Does not report any more episodes.  Follows with Dr. Cindie and refused endoscopy and colonoscopy.  Orders Placed This Encounter  Procedures   Comprehensive metabolic panel    Standing Status:   Future    Expected Date:   03/04/2025    Expiration Date:   09/05/2025   CBC with Differential/Platelet    Standing Status:   Future    Expected Date:   03/04/2025    Expiration Date:   09/05/2025    INTERVAL HISTORY: Patient returns for follow-up.  Reports appetite of 100% energy levels of 75%.  Denies any pain.  Reports numbness in his feet.  Denies any hospitalizations, surgeries or changes to his baseline health.  We reviewed flow cytometry, CBC with differential, CMP  SUMMARY OF HEMATOLOGIC HISTORY: Oncology History   No history exists.     CBC    Component Value Date/Time   WBC 11.4 (H) 08/29/2024 1039   RBC 5.39 08/29/2024 1039   HGB 15.7 08/29/2024 1039   HGB 15.6 05/02/2024 1429   HCT 49.2 08/29/2024 1039   HCT 48.0 05/02/2024 1429   PLT 142 (L) 08/29/2024 1039   PLT 153 05/02/2024 1429   MCV 91.3 08/29/2024 1039   MCV 90 05/02/2024 1429   MCH 29.1 08/29/2024 1039   MCHC 31.9 08/29/2024 1039   RDW 15.2 08/29/2024 1039   RDW 12.5 05/02/2024 1429   LYMPHSABS 1.5  08/29/2024 1039   LYMPHSABS 1.4 05/02/2024 1429   MONOABS 1.2 (H) 08/29/2024 1039   EOSABS 0.5 08/29/2024 1039   EOSABS 0.3 05/02/2024 1429   BASOSABS 0.1 08/29/2024 1039   BASOSABS 0.1 05/02/2024 1429       Latest Ref Rng & Units 08/29/2024   10:39 AM 06/26/2024   11:45 AM 02/16/2024    1:32 PM  CMP  Glucose 70 - 99 mg/dL 862  883  887   BUN 8 - 23 mg/dL 15  15  16    Creatinine 0.61 - 1.24 mg/dL 9.07  9.19  9.07   Sodium 135 - 145 mmol/L 139  136  140   Potassium 3.5 - 5.1 mmol/L 4.3  4.7  4.1   Chloride 98 - 111 mmol/L 103  101  98   CO2 22 - 32 mmol/L 23  21  28    Calcium 8.9 - 10.3 mg/dL 9.4  9.3  89.6   Total Protein 6.5 - 8.1 g/dL 7.7   7.5   Total Bilirubin 0.0 - 1.2 mg/dL 0.8   0.8   Alkaline Phos 38 - 126 U/L 49   44   AST 15 - 41 U/L 27   29   ALT 0 - 44 U/L 29   31      Lab Results  Component Value Date   FERRITIN 181 02/16/2024   VITAMINB12 486  02/16/2024    Vitals:   09/05/24 1416  BP: (!) 113/57  Pulse: 61  Resp: 18  Temp: 98.4 F (36.9 C)  SpO2: 99%    Review of System:  Review of Systems  Constitutional:  Positive for malaise/fatigue.  Genitourinary:        Self caths.  Neurological:  Positive for tingling.    Physical Exam: Physical Exam Constitutional:      Appearance: Normal appearance.  HENT:     Head: Normocephalic and atraumatic.  Eyes:     Pupils: Pupils are equal, round, and reactive to light.  Cardiovascular:     Rate and Rhythm: Normal rate and regular rhythm.     Heart sounds: Normal heart sounds. No murmur heard. Pulmonary:     Effort: Pulmonary effort is normal.     Breath sounds: Normal breath sounds. No wheezing.  Abdominal:     General: Bowel sounds are normal. There is no distension.     Palpations: Abdomen is soft.     Tenderness: There is no abdominal tenderness.  Musculoskeletal:        General: Normal range of motion.     Cervical back: Normal range of motion.  Skin:    General: Skin is warm and dry.      Findings: No rash.  Neurological:     Mental Status: He is alert and oriented to person, place, and time.     Gait: Gait is intact.  Psychiatric:        Mood and Affect: Mood and affect normal.        Cognition and Memory: Memory normal.        Judgment: Judgment normal.      I spent 20 minutes dedicated to the care of this patient (face-to-face and non-face-to-face) on the date of the encounter to include what is described in the assessment and plan.,  Delon Hope, NP 09/05/2024 2:42 PM

## 2024-09-19 DIAGNOSIS — E782 Mixed hyperlipidemia: Secondary | ICD-10-CM | POA: Diagnosis not present

## 2024-09-19 DIAGNOSIS — E1169 Type 2 diabetes mellitus with other specified complication: Secondary | ICD-10-CM | POA: Diagnosis not present

## 2024-09-19 DIAGNOSIS — E559 Vitamin D deficiency, unspecified: Secondary | ICD-10-CM | POA: Diagnosis not present

## 2024-09-28 ENCOUNTER — Telehealth: Payer: Self-pay | Admitting: Urology

## 2024-09-28 NOTE — Telephone Encounter (Signed)
 I returned call to patient- wife voiced patient was in the yard working. DPR gives permission to speak with wife. Wife stated patients urinary symptoms have returned. Last botox  was in July. Patient does not have an upcoming appt until December.  Dr. Sherrilee please advise on next steps for patient.

## 2024-09-28 NOTE — Telephone Encounter (Signed)
 Patient called into the office today with general questions/concerns regarding needing Botox  treatment. He is having bad bladder spasms Patient may be reached at [phone] to discuss questions.

## 2024-09-30 ENCOUNTER — Other Ambulatory Visit: Payer: Self-pay | Admitting: Urology

## 2024-10-02 NOTE — Telephone Encounter (Signed)
 Can patient be office botox  or OR?

## 2024-10-05 DIAGNOSIS — Z130 Encounter for screening for diseases of the blood and blood-forming organs and certain disorders involving the immune mechanism: Secondary | ICD-10-CM | POA: Diagnosis not present

## 2024-10-05 DIAGNOSIS — Z0001 Encounter for general adult medical examination with abnormal findings: Secondary | ICD-10-CM | POA: Diagnosis not present

## 2024-10-05 DIAGNOSIS — I10 Essential (primary) hypertension: Secondary | ICD-10-CM | POA: Diagnosis not present

## 2024-10-05 DIAGNOSIS — E1165 Type 2 diabetes mellitus with hyperglycemia: Secondary | ICD-10-CM | POA: Diagnosis not present

## 2024-10-09 DIAGNOSIS — Z6838 Body mass index (BMI) 38.0-38.9, adult: Secondary | ICD-10-CM | POA: Diagnosis not present

## 2024-10-09 DIAGNOSIS — E1140 Type 2 diabetes mellitus with diabetic neuropathy, unspecified: Secondary | ICD-10-CM | POA: Diagnosis not present

## 2024-10-09 DIAGNOSIS — E1169 Type 2 diabetes mellitus with other specified complication: Secondary | ICD-10-CM | POA: Diagnosis not present

## 2024-10-09 DIAGNOSIS — R609 Edema, unspecified: Secondary | ICD-10-CM | POA: Diagnosis not present

## 2024-10-10 ENCOUNTER — Other Ambulatory Visit: Payer: Self-pay | Admitting: Urology

## 2024-10-10 DIAGNOSIS — N319 Neuromuscular dysfunction of bladder, unspecified: Secondary | ICD-10-CM

## 2024-10-12 DIAGNOSIS — Z23 Encounter for immunization: Secondary | ICD-10-CM | POA: Diagnosis not present

## 2024-10-20 ENCOUNTER — Encounter (HOSPITAL_COMMUNITY)
Admission: RE | Admit: 2024-10-20 | Discharge: 2024-10-20 | Disposition: A | Discharge status: Home / Self Care | Source: Ambulatory Visit | Attending: Urology | Admitting: Urology

## 2024-10-20 ENCOUNTER — Encounter (HOSPITAL_COMMUNITY): Payer: Self-pay

## 2024-10-20 ENCOUNTER — Other Ambulatory Visit: Payer: Self-pay

## 2024-10-20 DIAGNOSIS — E559 Vitamin D deficiency, unspecified: Secondary | ICD-10-CM | POA: Diagnosis not present

## 2024-10-20 DIAGNOSIS — E1169 Type 2 diabetes mellitus with other specified complication: Secondary | ICD-10-CM | POA: Diagnosis not present

## 2024-10-20 DIAGNOSIS — E782 Mixed hyperlipidemia: Secondary | ICD-10-CM | POA: Diagnosis not present

## 2024-10-20 NOTE — Progress Notes (Signed)
 Spoke w/ via phone for pre-op interview jabarie pop Lab needs dos---- none       Lab results------see chart COVID test -----patient states asymptomatic no test needed Arrive at 11:15 a.m. NPO after MN NO Solid Food.  Clear liquids from MN until 500 a.m. Pre-Surgery Ensure or G2: stopped 10/26   Med rec completed Medications to take morning of surgery Gabapentin, Metoprolol. Diabetic medication -----none  GLP1 agonist last dose:10/30 and 10/26 GLP1 instructions:  Patient instructed no nail polish to be worn day of surgery Patient instructed to bring photo id and insurance card day of surgery Patient aware to have Driver (ride ) / caregiver    for 24 hours after surgery -  Patient Special Instructions ----- Pre-Op special Instructions -----  Patient verbalized understanding of instructions that were given at this phone interview. Patient denies chest pain, sob, fever, cough at the interview.

## 2024-10-23 ENCOUNTER — Encounter (HOSPITAL_COMMUNITY)
Admission: RE | Disposition: A | Discharge status: Home / Self Care | Payer: Self-pay | Source: Home / Self Care | Attending: Urology

## 2024-10-23 ENCOUNTER — Encounter (HOSPITAL_COMMUNITY): Payer: Self-pay | Admitting: Urology

## 2024-10-23 ENCOUNTER — Ambulatory Visit (HOSPITAL_COMMUNITY): Admitting: Anesthesiology

## 2024-10-23 ENCOUNTER — Ambulatory Visit (HOSPITAL_COMMUNITY)
Admission: RE | Admit: 2024-10-23 | Discharge: 2024-10-23 | Disposition: A | Discharge status: Home / Self Care | Attending: Urology | Admitting: Urology

## 2024-10-23 DIAGNOSIS — Z7984 Long term (current) use of oral hypoglycemic drugs: Secondary | ICD-10-CM | POA: Insufficient documentation

## 2024-10-23 DIAGNOSIS — N319 Neuromuscular dysfunction of bladder, unspecified: Secondary | ICD-10-CM | POA: Insufficient documentation

## 2024-10-23 DIAGNOSIS — Z9079 Acquired absence of other genital organ(s): Secondary | ICD-10-CM | POA: Insufficient documentation

## 2024-10-23 DIAGNOSIS — Z7985 Long-term (current) use of injectable non-insulin antidiabetic drugs: Secondary | ICD-10-CM | POA: Insufficient documentation

## 2024-10-23 DIAGNOSIS — I10 Essential (primary) hypertension: Secondary | ICD-10-CM | POA: Diagnosis not present

## 2024-10-23 DIAGNOSIS — E119 Type 2 diabetes mellitus without complications: Secondary | ICD-10-CM | POA: Insufficient documentation

## 2024-10-23 DIAGNOSIS — N3281 Overactive bladder: Secondary | ICD-10-CM | POA: Diagnosis present

## 2024-10-23 HISTORY — PX: CYSTOSCOPY WITH INJECTION: SHX1424

## 2024-10-23 SURGERY — CYSTOSCOPY, WITH INJECTION OF BLADDER NECK OR BLADDER WALL
Anesthesia: General | Site: Bladder

## 2024-10-23 MED ORDER — FENTANYL CITRATE (PF) 100 MCG/2ML IJ SOLN
INTRAMUSCULAR | Status: DC | PRN
  Administered 2024-10-23 (×2): 25 ug via INTRAVENOUS

## 2024-10-23 MED ORDER — WATER FOR IRRIGATION, STERILE IR SOLN
Status: DC | PRN
  Administered 2024-10-23: 500 mL

## 2024-10-23 MED ORDER — SODIUM CHLORIDE (PF) 0.9 % IJ SOLN
INTRAMUSCULAR | Status: AC
  Filled 2024-10-23: qty 20

## 2024-10-23 MED ORDER — OXYCODONE HCL 5 MG/5ML PO SOLN: 5.0000 mg | Freq: Once | ORAL | Status: DC | PRN

## 2024-10-23 MED ORDER — PROPOFOL 10 MG/ML IV BOLUS
INTRAVENOUS | Status: DC | PRN
Start: 2024-10-23 — End: 2024-10-23
  Administered 2024-10-23: 150 mg via INTRAVENOUS

## 2024-10-23 MED ORDER — LACTATED RINGERS IV SOLN: INTRAVENOUS | Status: DC

## 2024-10-23 MED ORDER — ORAL CARE MOUTH RINSE: 15.0000 mL | Freq: Once | OROMUCOSAL | Status: AC

## 2024-10-23 MED ORDER — CHLORHEXIDINE GLUCONATE 0.12 % MT SOLN
15.0000 mL | Freq: Once | OROMUCOSAL | Status: AC
  Administered 2024-10-23: 15 mL via OROMUCOSAL

## 2024-10-23 MED ORDER — SODIUM CHLORIDE (PF) 0.9 % IJ SOLN
INTRAMUSCULAR | Status: DC | PRN
  Administered 2024-10-23: 20 mL

## 2024-10-23 MED ORDER — LIDOCAINE 2% (20 MG/ML) 5 ML SYRINGE
INTRAMUSCULAR | Status: AC
Start: 2024-10-23 — End: 2024-10-23
  Filled 2024-10-23: qty 5

## 2024-10-23 MED ORDER — ONABOTULINUMTOXINA 100 UNITS IJ SOLR
200.0000 [IU] | Freq: Once | INTRAMUSCULAR | Status: DC
  Filled 2024-10-23: qty 200

## 2024-10-23 MED ORDER — DEXAMETHASONE SOD PHOSPHATE PF 10 MG/ML IJ SOLN
INTRAMUSCULAR | Status: DC | PRN
  Administered 2024-10-23: 5 mg via INTRAVENOUS

## 2024-10-23 MED ORDER — SODIUM CHLORIDE 0.9 % IV SOLN: 12.5000 mg | INTRAVENOUS | Status: DC | PRN

## 2024-10-23 MED ORDER — PHENYLEPHRINE 80 MCG/ML (10ML) SYRINGE FOR IV PUSH (FOR BLOOD PRESSURE SUPPORT)
PREFILLED_SYRINGE | INTRAVENOUS | Status: DC | PRN
  Administered 2024-10-23: 80 ug via INTRAVENOUS

## 2024-10-23 MED ORDER — LIDOCAINE 2% (20 MG/ML) 5 ML SYRINGE
INTRAMUSCULAR | Status: DC | PRN
Start: 2024-10-23 — End: 2024-10-23
  Administered 2024-10-23: 60 mg via INTRAVENOUS

## 2024-10-23 MED ORDER — OXYCODONE HCL 5 MG PO TABS: 5.0000 mg | ORAL_TABLET | Freq: Once | ORAL | Status: DC | PRN

## 2024-10-23 MED ORDER — FENTANYL CITRATE (PF) 50 MCG/ML IJ SOSY: 25.0000 ug | PREFILLED_SYRINGE | INTRAMUSCULAR | Status: DC | PRN

## 2024-10-23 MED ORDER — FENTANYL CITRATE (PF) 100 MCG/2ML IJ SOLN
INTRAMUSCULAR | Status: AC
  Filled 2024-10-23: qty 2

## 2024-10-23 MED ORDER — ONDANSETRON HCL 4 MG/2ML IJ SOLN
INTRAMUSCULAR | Status: DC | PRN
  Administered 2024-10-23: 4 mg via INTRAVENOUS

## 2024-10-23 MED ORDER — ONABOTULINUMTOXINA 100 UNITS IJ SOLR
INTRAMUSCULAR | Status: DC | PRN
Start: 2024-10-23 — End: 2024-10-23
  Administered 2024-10-23: 200 [IU] via INTRAMUSCULAR

## 2024-10-23 MED ORDER — CEFAZOLIN SODIUM-DEXTROSE 2-4 GM/100ML-% IV SOLN
2.0000 g | INTRAVENOUS | Status: AC
  Administered 2024-10-23: 2 g via INTRAVENOUS
  Filled 2024-10-23: qty 100

## 2024-10-23 SURGICAL SUPPLY — 23 items
BAG DRAIN URO TABLE W/ADPT NS (BAG) ×1 IMPLANT
BAG HAMPER (MISCELLANEOUS) ×1 IMPLANT
BAG URINE LEG 500ML (DRAIN) IMPLANT
CATH FOLEY 2WAY SLVR 5CC 20FR (CATHETERS) IMPLANT
CLOTH BEACON ORANGE TIMEOUT ST (SAFETY) ×1 IMPLANT
GLOVE BIO SURGEON STRL SZ8 (GLOVE) ×1 IMPLANT
GLOVE BIOGEL PI IND STRL 7.0 (GLOVE) ×2 IMPLANT
GOWN STRL REUS W/TWL LRG LVL3 (GOWN DISPOSABLE) ×1 IMPLANT
GOWN STRL REUS W/TWL XL LVL3 (GOWN DISPOSABLE) ×1 IMPLANT
KIT TURNOVER CYSTO (KITS) ×1 IMPLANT
MANIFOLD NEPTUNE II (INSTRUMENTS) ×1 IMPLANT
NDL ASPIRATION 22 (NEEDLE) ×1 IMPLANT
NDL HYPO 18GX1.5 BLUNT FILL (NEEDLE) ×1 IMPLANT
NEEDLE ASPIRATION 22 (NEEDLE) ×1 IMPLANT
NEEDLE HYPO 18GX1.5 BLUNT FILL (NEEDLE) ×1 IMPLANT
PACK CYSTO (CUSTOM PROCEDURE TRAY) ×1 IMPLANT
PAD ARMBOARD POSITIONER FOAM (MISCELLANEOUS) ×1 IMPLANT
POSITIONER HEAD 8X9X4 ADT (SOFTGOODS) ×1 IMPLANT
SYR 30ML LL (SYRINGE) ×1 IMPLANT
SYR CONTROL 10ML LL (SYRINGE) ×1 IMPLANT
TOWEL OR 17X26 4PK STRL BLUE (TOWEL DISPOSABLE) ×1 IMPLANT
WATER STERILE IRR 3000ML UROMA (IV SOLUTION) ×1 IMPLANT
WATER STERILE IRR 500ML POUR (IV SOLUTION) ×1 IMPLANT

## 2024-10-23 NOTE — Transfer of Care (Addendum)
 Immediate Anesthesia Transfer of Care Note  Patient: Marc Burns  Procedure(s) Performed: CYSTOSCOPY, WITH INJECTION OF BLADDER NECK OR BLADDER WALL (Bladder)  Patient Location: PACU  Anesthesia Type:General  Level of Consciousness: drowsy and patient cooperative  Airway & Oxygen  Therapy: Patient Spontanous Breathing and Patient connected to face mask oxygen   Post-op Assessment: Report given to RN and Post -op Vital signs reviewed and stable  Post vital signs: Reviewed and stable  Last Vitals:  Vitals Value Taken Time  BP 127/53 10/23/24 09:33  Temp 36.8 10/23/24   09:34  Pulse 68 10/23/24 09:36  Resp 14 10/23/24 09:36  SpO2 98 % 10/23/24 09:36  Vitals shown include unfiled device data.  Last Pain:  Vitals:   10/23/24 0807  PainSc: 0-No pain         Complications: No notable events documented.

## 2024-10-23 NOTE — H&P (Signed)
 History of Present Illness: Marc Burns is a 78 y.o. adult who presents today for follow up visit at [facility] [place].  GU History includes: 1. Prostate cancer. Underwent prostatectomy in 2004. 2. Neurogenic bladder with urinary retention and incontinence.  - Managed with chronic indwelling Foley catheter. - Leakage refractory to anticholinergic therapy; improved with intravesical Botox  previously. 3. Bladder neck contracture. 4. Kidney stones.   At last visit on 11/03/2023: Doing well after bladder Botox  procedure (200 units) by Dr. Sherrilee on 10/21/2023.    Today: He reports that his bladder spasms continue to be well controlled following the Botox  procedure; denies need to repeat that quite yet. He is doing well with exchanging at home approximately 1x/month.      Medications:       Current Outpatient Medications  Medication Sig Dispense Refill   amLODipine-benazepril (LOTREL) 5-20 MG per capsule Take 1 capsule by mouth every morning.       aspirin EC 81 MG tablet Take 81 mg by mouth daily. Swallow whole.       cholecalciferol (VITAMIN D3) 25 MCG (1000 UNIT) tablet Take 1,000 Units by mouth 2 (two) times daily.       empagliflozin (JARDIANCE) 25 MG TABS tablet Take 25 mg by mouth daily.       furosemide  (LASIX ) 20 MG tablet Take 20 mg by mouth daily.       gabapentin (NEURONTIN) 600 MG tablet Take 600 mg by mouth 3 (three) times daily.       ibuprofen (ADVIL) 200 MG tablet Take 400 mg by mouth every 6 (six) hours as needed for moderate pain or headache.       metoprolol tartrate (LOPRESSOR) 25 MG tablet Take 25 mg by mouth 2 (two) times daily.       Multiple Vitamins-Minerals (CENTRUM SILVER ADULT 50+ PO) Take 1 tablet by mouth daily.       omeprazole (PRILOSEC) 20 MG capsule Take 20 mg by mouth daily.       OZEMPIC, 1 MG/DOSE, 4 MG/3ML SOPN Inject 1 mg into the skin once a week.       primidone  (MYSOLINE ) 50 MG tablet Take 1 tablet (50 mg total) by mouth 2 (two)  times daily. 180 tablet 3   rosuvastatin (CRESTOR) 10 MG tablet Take 10 mg by mouth at bedtime.       Semaglutide, 1 MG/DOSE, (OZEMPIC, 1 MG/DOSE,) 2 MG/1.5ML SOPN Inject 1 mg into the skin every Sunday.          No current facility-administered medications for this visit.        Allergies: Allergies      Allergies  Allergen Reactions   Sulfa Antibiotics Itching and Rash            Past Medical History:  Diagnosis Date   Arthritis     Bladder neck contracture     Complication of anesthesia      Difficult intubation   Diabetes mellitus without complication (HCC)     Difficult intubation     Foley catheter in place     GERD (gastroesophageal reflux disease)     History of gastric ulcer      2007   History of kidney stones     History of prostate cancer      20 04--  S/P PROSTATECTOMY   Hypertension     Urinary incontinence     Wears glasses  Past Surgical History:  Procedure Laterality Date   BALLOON DILATION N/A 09/12/2013    Procedure: CYSTO BALLOON DILATION/RETROGRADE URETHROGRAM;  Surgeon: Glendia DELENA Elizabeth, MD;  Location: [facility];  Service: Urology;  Laterality: N/A;   BOTOX  INJECTION N/A 01/08/2022    Procedure: BOTOX  INJECTION;  Surgeon: Sherrilee Belvie CROME, MD;  Location: [facility];  Service: Urology;  Laterality: N/A;   BOTOX  INJECTION N/A 08/13/2022    Procedure: BOTOX  INJECTION;  Surgeon: Sherrilee Belvie CROME, MD;  Location: [facility];  Service: Urology;  Laterality: N/A;   BOTOX  INJECTION N/A 04/15/2023    Procedure: BOTOX  INJECTION;  Surgeon: Sherrilee Belvie CROME, MD;  Location: [facility];  Service: Urology;  Laterality: N/A;   CYSTO/  URETHRAL DILATION/ BLADDER BX/ BILATERAL RETROGRADE PYELOGRAM   03-03-2010   CYSTO/ BALLOON DILATION AND VAPORIZATION BLADDER NECK & PROSTATIC FOSSA   04-28-2010   CYSTO/ BALLOON DILATION BLADDER NECK CONTRACTURE   07-23-2011;   11-17-2010;  08-08-2010   CYSTOSCOPY N/A 01/08/2022    Procedure: CYSTOSCOPY  WITH INTRAVESICAL BOTOX  100 UNITS;  Surgeon: Sherrilee Belvie CROME, MD;  Location: [facility];  Service: Urology;  Laterality: N/A;   CYSTOSCOPY N/A 04/15/2023    Procedure: CYSTOSCOPY WITH FULGURATION OF BLEEDING;  Surgeon: Sherrilee Belvie CROME, MD;  Location: [facility];  Service: Urology;  Laterality: N/A;   CYSTOSCOPY WITH DIRECT VISION INTERNAL URETHROTOMY N/A 04/24/2021    Procedure: CYSTOSCOPY WITH DIRECT VISION INTERNAL URETHROTOMY;  Surgeon: Sherrilee Belvie CROME, MD;  Location: [facility];  Service: Urology;  Laterality: N/A;   CYSTOSCOPY WITH INJECTION N/A 08/13/2022    Procedure: CYSTOSCOPY WITH INJECTION;  Surgeon: Sherrilee Belvie CROME, MD;  Location: [facility];  Service: Urology;  Laterality: N/A;   CYSTOSCOPY WITH INJECTION N/A 10/21/2023    Procedure: CYSTOSCOPY WITH INJECTION- Botox ;  Surgeon: Sherrilee Belvie CROME, MD;  Location: [facility];  Service: Urology;  Laterality: N/A;   FULGURATION OF LESION   04/24/2021    Procedure: FULGURATION OF BLADDER NECK;  Surgeon: Sherrilee Belvie CROME, MD;  Location: [facility];  Service: Urology;;   MITOMYCIN  C INJECTION   04/24/2021    Procedure: MITOMYCIN  C INJECTION ANTERIOR BLADDER WALL;  Surgeon: Sherrilee Belvie CROME, MD;  Location: [facility];  Service: Urology;;   PROSTATECTOMY   2004    RADICAL   TONSILLECTOMY   AS CHILD   TRANSURETHRAL INCISION AND RESECTION OF BLADDER NECK CONTRACTURE   09-01-2010   UMBILICAL HERNIA REPAIR                 Family History  Problem Relation Age of Onset   Heart attack Mother     Heart attack Father          Social History         Socioeconomic History   Marital status: Married      Spouse name: Edna   Number of children: Not on file   Years of education: Not on file   Highest education level: Not on file  Occupational History   Not on file  Tobacco Use   Smoking status: Never   Smokeless tobacco: Never  Vaping Use   Vaping status: Never Used  Substance and Sexual Activity   Alcohol  use: Yes      Alcohol /week: 6.0  standard drinks of alcohol       Types: 6 Cans of beer per week      Comment: occasionally   Drug use: No   Sexual activity: Not Currently  Other  Topics Concern   Not on file  Social History Narrative   Not on file    Social Drivers of Health        Financial Resource Strain: Not on file  Food Insecurity: No Food Insecurity (02/16/2024)    Hunger Vital Sign     Worried About Running Out of Food in the Last Year: Never true     Ran Out of Food in the Last Year: Never true  Transportation Needs: No Transportation Needs (02/16/2024)    PRAPARE - Therapist, Art (Medical): No     Lack of Transportation (Non-Medical): No  Physical Activity: Not on file  Stress: Not on file  Social Connections: Not on file  Intimate Partner Violence: Not At Risk (02/16/2024)    Humiliation, Afraid, Rape, and Kick questionnaire     Fear of Current or Ex-Partner: No     Emotionally Abused: No     Physically Abused: No     Sexually Abused: No      Review of Systems Constitutional: Patient denies any unintentional weight loss or change in strength lntegumentary: Patient denies any rashes or pruritus Cardiovascular: Patient denies chest pain or syncope Respiratory: Patient denies shortness of breath Musculoskeletal: Patient denies muscle cramps or weakness Neurologic: Patient denies convulsions or seizures Allergic/Immunologic: Patient denies recent allergic reaction(s) Hematologic/Lymphatic: Patient denies bleeding tendencies Endocrine: Patient denies heat/cold intolerance   GU: As per HPI.   OBJECTIVE    Vitals:    03/31/24 1108  BP: 118/75  Pulse: 65    There is no height or weight on file to calculate BMI.   Physical Examination Constitutional: No obvious distress; patient is non-toxic appearing  Cardiovascular: No visible lower extremity edema.  Respiratory: The patient does not have audible wheezing/stridor; respirations do not appear labored   Gastrointestinal: Abdomen non-distended Musculoskeletal: Normal ROM of UEs  Skin: No obvious rashes/open sores  Neurologic: CN 2-12 grossly intact Psychiatric: Answered questions appropriately with normal affect  Hematologic/Lymphatic/Immunologic: No obvious bruises or sites of spontaneous bleeding   ASSESSMENT Neurogenic bladder - Plan: POCT urinalysis dipstick, CANCELED: BLADDER SCAN AMB NON-IMAGING   He is doing well. We agreed to plan for follow up in 3 months or sooner if needed. He was advised to notify Urology if he would like to repeat Botox  prior to next visit so that surgery request can be submitted with Dr. Sherrilee. Patient verbalized understanding of and agreement with current plan. All questions were answered.   PLAN The risks/benefits/alterantives to intervesical botox  200 units was explained to the patient and he understands and wishes to proceed with surgery

## 2024-10-23 NOTE — Anesthesia Preprocedure Evaluation (Addendum)
 Anesthesia Evaluation  Patient identified by MRN, date of birth, ID band Patient awake    Reviewed: Allergy & Precautions, H&P , NPO status , Patient's Chart, lab work & pertinent test results, reviewed documented beta blocker date and time   History of Anesthesia Complications (+) DIFFICULT AIRWAY and history of anesthetic complications  Airway Mallampati: II  TM Distance: >3 FB Neck ROM: full    Dental no notable dental hx. (+) Dental Advisory Given,  Many missing:   Pulmonary neg pulmonary ROS   Pulmonary exam normal breath sounds clear to auscultation       Cardiovascular Exercise Tolerance: Good hypertension, Normal cardiovascular exam Rhythm:regular Rate:Normal     Neuro/Psych negative neurological ROS  negative psych ROS   GI/Hepatic negative GI ROS, Neg liver ROS,GERD  ,,  Endo/Other  diabetes, Type 2    Renal/GU negative Renal ROS  negative genitourinary   Musculoskeletal  (+) Arthritis , Osteoarthritis,    Abdominal   Peds  Hematology negative hematology ROS (+)   Anesthesia Other Findings   Reproductive/Obstetrics negative OB ROS                              Anesthesia Physical Anesthesia Plan  ASA: 2  Anesthesia Plan: General   Post-op Pain Management: Dilaudid  IV   Induction: Intravenous  PONV Risk Score and Plan: Ondansetron , Dexamethasone  and Midazolam   Airway Management Planned: LMA  Additional Equipment: None  Intra-op Plan:   Post-operative Plan:   Informed Consent: I have reviewed the patients History and Physical, chart, labs and discussed the procedure including the risks, benefits and alternatives for the proposed anesthesia with the patient or authorized representative who has indicated his/her understanding and acceptance.     Dental Advisory Given  Plan Discussed with: CRNA  Anesthesia Plan Comments:          Anesthesia Quick  Evaluation

## 2024-10-23 NOTE — Anesthesia Procedure Notes (Signed)
 Procedure Name: LMA Insertion Date/Time: 10/23/2024 9:08 AM  Performed by: Para Jerelene CROME, CRNAPre-anesthesia Checklist: Patient identified, Emergency Drugs available, Suction available and Patient being monitored Patient Re-evaluated:Patient Re-evaluated prior to induction Oxygen  Delivery Method: Circle system utilized Preoxygenation: Pre-oxygenation with 100% oxygen  Induction Type: IV induction Ventilation: Mask ventilation without difficulty LMA: LMA inserted LMA Size: 4.0 Number of attempts: 1 Placement Confirmation: positive ETCO2, CO2 detector and breath sounds checked- equal and bilateral ETT to lip (cm): No markings on Ambu aurastraight LMA size 4. Tube secured with: Tape Dental Injury: Teeth and Oropharynx as per pre-operative assessment  Comments: Atraumatic insertion of LMA size 4 x 1. Lips and teeth remain in preoperative condition.

## 2024-10-23 NOTE — Anesthesia Postprocedure Evaluation (Signed)
 Anesthesia Post Note  Patient: Marc Burns  Procedure(s) Performed: CYSTOSCOPY, WITH INJECTION OF BLADDER NECK OR BLADDER WALL (Bladder)  Patient location during evaluation: PACU Anesthesia Type: General Level of consciousness: awake and alert Pain management: pain level controlled Vital Signs Assessment: post-procedure vital signs reviewed and stable Respiratory status: spontaneous breathing, nonlabored ventilation, respiratory function stable and patient connected to nasal cannula oxygen  Cardiovascular status: blood pressure returned to baseline and stable Postop Assessment: no apparent nausea or vomiting Anesthetic complications: no   There were no known notable events for this encounter.   Last Vitals:  Vitals:   10/23/24 0958 10/23/24 1000  BP: 124/60 124/60  Pulse: 65 67  Resp: 18 20  Temp: 36.8 C   SpO2: 96% 92%    Last Pain:  Vitals:   10/23/24 0958  TempSrc: Oral  PainSc: 0-No pain                 Miyonna Ormiston L Lynsey Ange

## 2024-10-23 NOTE — Op Note (Signed)
 Preoperative diagnosis: neurogenic bladder  Postoperative diagnosis: same  Procedure: 1 cystoscopy 2. Intravesical botox  injection 200 units  Attending: Belvie Clara  Anesthesia: General  Estimated blood loss: Minimal  Drains: 20 french foley  Specimens: none  Antibiotics: ancef   Findings:  Ureteral orifices in normal anatomic location.  No masses/lesions in the bladder  Indications: Patient is a 78 year old male with a history of neurogenic bladder and urinary leakage refractory to anticholinergic therapy.  After discussing treatment options, they decided proceed with intravesical botox  injection.  Procedure in detail: The patient was brought to the operating room and a brief timeout was done to ensure correct patient, correct procedure, correct site.  General anesthesia was administered patient was placed in dorsal lithotomy position.  Their genitalia was then prepped and draped in usual sterile fashion.  A rigid 22 French cystoscope was passed in the urethra and the bladder.  Bladder was inspected and we noted no masses or lesions.  the ureteral orifices were in the normal orthotopic locations. Using a deflux injection needle we proceed to inject 200 units in a grid pattern between the ureteral orifices starting at the trigone to the mid posterior wall. We injected a total of 20 sites with 10 units per injection. The bladder was then drained and this concluded the procedure which was well tolerated by patient.  Complications: None  Condition: Stable, extubated, transferred to PACU  Plan: Patient is to be discharge home. They will followup in 1 month

## 2024-10-24 ENCOUNTER — Encounter (HOSPITAL_COMMUNITY): Payer: Self-pay | Admitting: Urology

## 2024-10-31 ENCOUNTER — Other Ambulatory Visit (HOSPITAL_COMMUNITY)

## 2024-11-03 ENCOUNTER — Telehealth: Payer: Self-pay

## 2024-11-03 NOTE — Telephone Encounter (Signed)
 I was able to move patient's post op appt to 11/18/2024 since his surgery date was moved up to 10/23/2024. Patients wife is aware of new appt date and time.

## 2024-11-09 DIAGNOSIS — Z6838 Body mass index (BMI) 38.0-38.9, adult: Secondary | ICD-10-CM | POA: Diagnosis not present

## 2024-11-09 DIAGNOSIS — E1140 Type 2 diabetes mellitus with diabetic neuropathy, unspecified: Secondary | ICD-10-CM | POA: Diagnosis not present

## 2024-11-15 ENCOUNTER — Ambulatory Visit: Admitting: Urology

## 2024-11-15 VITALS — BP 109/70 | HR 72

## 2024-11-15 DIAGNOSIS — Z09 Encounter for follow-up examination after completed treatment for conditions other than malignant neoplasm: Secondary | ICD-10-CM

## 2024-11-15 DIAGNOSIS — N319 Neuromuscular dysfunction of bladder, unspecified: Secondary | ICD-10-CM

## 2024-11-15 NOTE — Progress Notes (Signed)
 11/15/2024 9:34 AM   Marc Burns 02/01/1946 [id number]  Referring provider: Trudy Vaughn FALCON, MD [address] [place],  [place] [address]  Followup neurogenic bladder   HPI: Marc Burns is a 78yo here for followup for neurogenic bladder. He has been doing well since intravesical botox . No pelvic pain. No urinary incontinence.    PMH: Past Medical History:  Diagnosis Date   Arthritis    Bladder neck contracture    Complication of anesthesia    Difficult intubation   Diabetes mellitus without complication (HCC)    Difficult intubation    Foley catheter in place    GERD (gastroesophageal reflux disease)    History of gastric ulcer    2007   History of kidney stones    History of prostate cancer    2004--  S/P PROSTATECTOMY   Hypertension    Urinary incontinence    Wears glasses     Surgical History: Past Surgical History:  Procedure Laterality Date   BALLOON DILATION N/A 09/12/2013   Procedure: CYSTO BALLOON DILATION/RETROGRADE URETHROGRAM;  Surgeon: Glendia DELENA Elizabeth, MD;  Location: [facility];  Service: Urology;  Laterality: N/A;   BOTOX  INJECTION N/A 01/08/2022   Procedure: BOTOX  INJECTION;  Surgeon: Sherrilee Belvie LITTIE, MD;  Location: [facility];  Service: Urology;  Laterality: N/A;   BOTOX  INJECTION N/A 08/13/2022   Procedure: BOTOX  INJECTION;  Surgeon: Sherrilee Belvie LITTIE, MD;  Location: [facility];  Service: Urology;  Laterality: N/A;   BOTOX  INJECTION N/A 04/15/2023   Procedure: BOTOX  INJECTION;  Surgeon: Sherrilee Belvie LITTIE, MD;  Location: [facility];  Service: Urology;  Laterality: N/A;   CYSTO/  URETHRAL DILATION/ BLADDER BX/ BILATERAL RETROGRADE PYELOGRAM  03-03-2010   CYSTO/ BALLOON DILATION AND VAPORIZATION BLADDER NECK & PROSTATIC FOSSA  04-28-2010   CYSTO/ BALLOON DILATION BLADDER NECK CONTRACTURE  07-23-2011;   11-17-2010;  08-08-2010   CYSTOSCOPY N/A 01/08/2022   Procedure: CYSTOSCOPY WITH INTRAVESICAL BOTOX  100 UNITS;  Surgeon: Sherrilee Belvie LITTIE, MD;  Location: [facility];  Service: Urology;  Laterality: N/A;   CYSTOSCOPY N/A 04/15/2023   Procedure: CYSTOSCOPY WITH FULGURATION OF BLEEDING;  Surgeon: Sherrilee Belvie LITTIE, MD;  Location: [facility];  Service: Urology;  Laterality: N/A;   CYSTOSCOPY WITH DIRECT VISION INTERNAL URETHROTOMY N/A 04/24/2021   Procedure: CYSTOSCOPY WITH DIRECT VISION INTERNAL URETHROTOMY;  Surgeon: Sherrilee Belvie LITTIE, MD;  Location: [facility];  Service: Urology;  Laterality: N/A;   CYSTOSCOPY WITH INJECTION N/A 08/13/2022   Procedure: CYSTOSCOPY WITH INJECTION;  Surgeon: Sherrilee Belvie LITTIE, MD;  Location: [facility];  Service: Urology;  Laterality: N/A;   CYSTOSCOPY WITH INJECTION N/A 10/21/2023   Procedure: CYSTOSCOPY WITH INJECTION- Botox ;  Surgeon: Sherrilee Belvie LITTIE, MD;  Location: [facility];  Service: Urology;  Laterality: N/A;   CYSTOSCOPY WITH INJECTION N/A 06/29/2024   Procedure: CYSTOSCOPY, WITH INJECTION OF BLADDER NECK OR BLADDER WALL;  Surgeon: Sherrilee Belvie LITTIE, MD;  Location: [facility];  Service: Urology;  Laterality: N/A;   CYSTOSCOPY WITH INJECTION N/A 10/23/2024   Procedure: CYSTOSCOPY, WITH INJECTION OF BLADDER NECK OR BLADDER WALL;  Surgeon: Sherrilee Belvie LITTIE, MD;  Location: [facility];  Service: Urology;  Laterality: N/A;  botox  200 units   FULGURATION OF LESION  04/24/2021   Procedure: FULGURATION OF BLADDER NECK;  Surgeon: Sherrilee Belvie LITTIE, MD;  Location: [facility];  Service: Urology;;   MITOMYCIN  C INJECTION  04/24/2021   Procedure: MITOMYCIN  C INJECTION ANTERIOR BLADDER WALL;  Surgeon: Sherrilee Belvie  L, MD;  Location: [facility];  Service: Urology;;   PROSTATECTOMY  2004   RADICAL   TONSILLECTOMY  AS CHILD   TRANSURETHRAL INCISION AND RESECTION OF BLADDER NECK CONTRACTURE  09-01-2010   UMBILICAL HERNIA REPAIR      Home Medications:  Allergies as of 11/15/2024       Reactions   Sulfa Antibiotics Itching, Rash        Medication List        Accurate as of November 15, 2024  9:34 AM. If you have any  questions, ask your nurse or doctor.          amLODipine-benazepril 5-20 MG capsule Commonly known as: LOTREL Take 1 capsule by mouth every morning.   aspirin EC 81 MG tablet Take 81 mg by mouth daily. Swallow whole.   CENTRUM SILVER ADULT 50+ PO Take 1 tablet by mouth daily.   ciprofloxacin  250 MG tablet Commonly known as: CIPRO  Take 1 tablet by mouth twice daily for 5 days   clotrimazole -betamethasone  cream Commonly known as: LOTRISONE  APPLY TOPICALLY TO AFFECTED AREA TWICE DAILY AS NEEDED FOR ITCHING   empagliflozin 25 MG Tabs tablet Commonly known as: JARDIANCE Take 25 mg by mouth daily.   furosemide  20 MG tablet Commonly known as: LASIX  Take 20 mg by mouth daily.   gabapentin 600 MG tablet Commonly known as: NEURONTIN Take 600 mg by mouth 3 (three) times daily.   ibuprofen 200 MG tablet Commonly known as: ADVIL Take 400 mg by mouth every 6 (six) hours as needed for moderate pain or headache.   metoprolol tartrate 25 MG tablet Commonly known as: LOPRESSOR Take 25 mg by mouth 2 (two) times daily.   Ozempic (1 MG/DOSE) 4 MG/3ML Sopn Generic drug: Semaglutide (1 MG/DOSE) Inject 1 mg into the skin once a week.   primidone  50 MG tablet Commonly known as: MYSOLINE  Take 1 tablet (50 mg total) by mouth 3 (three) times daily.   rosuvastatin 10 MG tablet Commonly known as: CRESTOR Take 10 mg by mouth at bedtime.   Vitamin D 50 MCG (2000 UT) Caps Take 2,000 Units by mouth 2 (two) times daily.        Allergies:  Allergies  Allergen Reactions   Sulfa Antibiotics Itching and Rash    Family History: Family History  Problem Relation Age of Onset   Heart attack Mother    Heart attack Father     Social History:  reports that he has never smoked. He has never used smokeless tobacco. He reports current alcohol  use of about 6.0 standard drinks of alcohol  per week. He reports that he does not use drugs.  ROS: All other review of systems were reviewed and  are negative except what is noted above in HPI  Physical Exam: BP 109/70   Pulse 72   Constitutional:  Alert and oriented, No acute distress. HEENT: [place] AT, moist mucus membranes.  Trachea midline, no masses. Cardiovascular: No clubbing, cyanosis, or edema. Respiratory: Normal respiratory effort, no increased work of breathing. GI: Abdomen is soft, nontender, nondistended, no abdominal masses GU: No CVA tenderness.  Lymph: No cervical or inguinal lymphadenopathy. Skin: No rashes, bruises or suspicious lesions. Neurologic: Grossly intact, no focal deficits, moving all 4 extremities. Psychiatric: Normal mood and affect.  Laboratory Data: Lab Results  Component Value Date   WBC 11.4 (H) 08/29/2024   HGB 15.7 08/29/2024   HCT 49.2 08/29/2024   MCV 91.3 08/29/2024   PLT 142 (L) 08/29/2024  Lab Results  Component Value Date   CREATININE 0.92 08/29/2024    No results found for: PSA  No results found for: TESTOSTERONE  Lab Results  Component Value Date   HGBA1C 7.7 (H) 06/26/2024    Urinalysis    Component Value Date/Time   COLORURINE YELLOW 02/26/2010 0930   APPEARANCEUR CLOUDY (A) 02/26/2010 0930   LABSPEC 1.019 02/26/2010 0930   PHURINE 5.5 02/26/2010 0930   GLUCOSEU NEGATIVE 02/26/2010 0930   HGBUR LARGE (A) 02/26/2010 0930   BILIRUBINUR NEGATIVE 02/26/2010 0930   KETONESUR NEGATIVE 02/26/2010 0930   PROTEINUR 30 (A) 02/26/2010 0930   UROBILINOGEN 0.2 02/26/2010 0930   NITRITE NEGATIVE 02/26/2010 0930   LEUKOCYTESUR SMALL (A) 02/26/2010 0930    Lab Results  Component Value Date   BACTERIA FEW (A) 02/26/2010    Pertinent Imaging:  No results found for this or any previous visit.  No results found for this or any previous visit.  No results found for this or any previous visit.  No results found for this or any previous visit.  Results for orders placed during the hospital encounter of 07/20/17  US  Renal  Narrative CLINICAL DATA:   78 year old male with urinary retention. Self catheterization. Initial encounter.  EXAM: RENAL / URINARY TRACT ULTRASOUND COMPLETE  COMPARISON:  None.  FINDINGS: Right Kidney:  Length: 11.3 cm. Echogenicity within normal limits. No mass or hydronephrosis visualized.  Left Kidney:  Length: 11.8 cm. Echogenicity within normal limits. No mass or hydronephrosis visualized.  Bladder:  Decompressed and not adequately assessed  IMPRESSION: Kidneys unremarkable  Bladder decompressed and not assessed   Electronically Signed By: Elspeth Slain M.D. On: 07/20/2017 12:25  No results found for this or any previous visit.  No results found for this or any previous visit.  No results found for this or any previous visit.   Assessment & Plan:    1. Postop check (Primary) Followup 5 months for botox  injection   No follow-ups on file.  Belvie Clara, MD  [facility] [place]

## 2024-11-17 DIAGNOSIS — E559 Vitamin D deficiency, unspecified: Secondary | ICD-10-CM | POA: Diagnosis not present

## 2024-11-17 DIAGNOSIS — E782 Mixed hyperlipidemia: Secondary | ICD-10-CM | POA: Diagnosis not present

## 2024-11-17 DIAGNOSIS — E1169 Type 2 diabetes mellitus with other specified complication: Secondary | ICD-10-CM | POA: Diagnosis not present

## 2024-11-21 ENCOUNTER — Encounter: Payer: Self-pay | Admitting: Urology

## 2024-11-21 NOTE — Patient Instructions (Signed)

## 2024-11-22 ENCOUNTER — Ambulatory Visit: Admitting: Urology

## 2024-12-20 ENCOUNTER — Ambulatory Visit: Admitting: Urology

## 2025-01-02 ENCOUNTER — Other Ambulatory Visit: Payer: Self-pay | Admitting: Urology

## 2025-01-05 ENCOUNTER — Other Ambulatory Visit: Payer: Self-pay | Admitting: Urology

## 2025-02-27 ENCOUNTER — Inpatient Hospital Stay

## 2025-03-06 ENCOUNTER — Ambulatory Visit: Admitting: Oncology

## 2025-03-06 ENCOUNTER — Inpatient Hospital Stay: Admitting: Oncology

## 2025-03-22 ENCOUNTER — Ambulatory Visit (HOSPITAL_COMMUNITY): Admit: 2025-03-22 | Admitting: Urology

## 2025-03-22 SURGERY — CYSTOSCOPY, WITH INJECTION OF BLADDER NECK OR BLADDER WALL
Anesthesia: General

## 2025-04-04 ENCOUNTER — Ambulatory Visit: Admitting: Urology

## 2025-05-08 ENCOUNTER — Ambulatory Visit: Admitting: Family Medicine
